# Patient Record
Sex: Female | Born: 1955
Health system: Southern US, Community
[De-identification: ages and names within clinical notes are randomized; demographics above are authoritative.]

## PROBLEM LIST (undated history)

## (undated) DIAGNOSIS — F5101 Primary insomnia: Secondary | ICD-10-CM

## (undated) DIAGNOSIS — M5430 Sciatica, unspecified side: Secondary | ICD-10-CM

## (undated) DIAGNOSIS — K219 Gastro-esophageal reflux disease without esophagitis: Secondary | ICD-10-CM

## (undated) DIAGNOSIS — R011 Cardiac murmur, unspecified: Secondary | ICD-10-CM

## (undated) DIAGNOSIS — I10 Essential (primary) hypertension: Secondary | ICD-10-CM

## (undated) DIAGNOSIS — I519 Heart disease, unspecified: Secondary | ICD-10-CM

## (undated) DIAGNOSIS — G4730 Sleep apnea, unspecified: Secondary | ICD-10-CM

## (undated) DIAGNOSIS — M722 Plantar fascial fibromatosis: Secondary | ICD-10-CM

## (undated) DIAGNOSIS — I219 Acute myocardial infarction, unspecified: Secondary | ICD-10-CM

## (undated) DIAGNOSIS — G4700 Insomnia, unspecified: Secondary | ICD-10-CM

## (undated) DIAGNOSIS — M1990 Unspecified osteoarthritis, unspecified site: Secondary | ICD-10-CM

## (undated) DIAGNOSIS — K5900 Constipation, unspecified: Secondary | ICD-10-CM

## (undated) DIAGNOSIS — I38 Endocarditis, valve unspecified: Secondary | ICD-10-CM

## (undated) DIAGNOSIS — F32A Depression, unspecified: Secondary | ICD-10-CM

## (undated) DIAGNOSIS — R51 Headache: Secondary | ICD-10-CM

## (undated) DIAGNOSIS — K432 Incisional hernia without obstruction or gangrene: Secondary | ICD-10-CM

## (undated) DIAGNOSIS — E7800 Pure hypercholesterolemia, unspecified: Secondary | ICD-10-CM

## (undated) DIAGNOSIS — F329 Major depressive disorder, single episode, unspecified: Secondary | ICD-10-CM

## (undated) DIAGNOSIS — F17200 Nicotine dependence, unspecified, uncomplicated: Secondary | ICD-10-CM

## (undated) DIAGNOSIS — I2510 Atherosclerotic heart disease of native coronary artery without angina pectoris: Secondary | ICD-10-CM

## (undated) DIAGNOSIS — I5189 Other ill-defined heart diseases: Secondary | ICD-10-CM

## (undated) DIAGNOSIS — G2581 Restless legs syndrome: Secondary | ICD-10-CM

## (undated) DIAGNOSIS — F419 Anxiety disorder, unspecified: Secondary | ICD-10-CM

## (undated) DIAGNOSIS — R519 Headache, unspecified: Secondary | ICD-10-CM

## (undated) DIAGNOSIS — T8859XA Other complications of anesthesia, initial encounter: Secondary | ICD-10-CM

## (undated) DIAGNOSIS — T4145XA Adverse effect of unspecified anesthetic, initial encounter: Secondary | ICD-10-CM

## (undated) DIAGNOSIS — M7120 Synovial cyst of popliteal space [Baker], unspecified knee: Secondary | ICD-10-CM

## (undated) DIAGNOSIS — J301 Allergic rhinitis due to pollen: Secondary | ICD-10-CM

## (undated) HISTORY — PX: CARPAL TUNNEL RELEASE: SHX101

## (undated) HISTORY — DX: Gastro-esophageal reflux disease without esophagitis: K21.9

## (undated) HISTORY — DX: Allergic rhinitis due to pollen: J30.1

## (undated) HISTORY — DX: Headache, unspecified: R51.9

## (undated) HISTORY — DX: Unspecified osteoarthritis, unspecified site: M19.90

## (undated) HISTORY — DX: Cardiac murmur, unspecified: R01.1

## (undated) HISTORY — DX: Depression, unspecified: F32.A

## (undated) HISTORY — DX: Anxiety disorder, unspecified: F41.9

## (undated) HISTORY — DX: Major depressive disorder, single episode, unspecified: F32.9

## (undated) HISTORY — DX: Other ill-defined heart diseases: I51.89

## (undated) HISTORY — DX: Nicotine dependence, unspecified, uncomplicated: F17.200

## (undated) HISTORY — DX: Heart disease, unspecified: I51.9

## (undated) HISTORY — DX: Restless legs syndrome: G25.81

## (undated) HISTORY — PX: VAGINAL HYSTERECTOMY: SUR661

## (undated) HISTORY — DX: Sciatica, unspecified side: M54.30

## (undated) HISTORY — DX: Pure hypercholesterolemia, unspecified: E78.00

## (undated) HISTORY — DX: Synovial cyst of popliteal space (Baker), unspecified knee: M71.20

## (undated) HISTORY — DX: Insomnia, unspecified: G47.00

## (undated) HISTORY — PX: CORONARY ANGIOPLASTY: SHX604

## (undated) HISTORY — DX: Headache: R51

---
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
## 1976-11-21 HISTORY — PX: APPENDECTOMY: SHX54

## 1995-11-22 HISTORY — PX: ANGIOPLASTY: SHX39

## 2011-03-21 ENCOUNTER — Ambulatory Visit: Payer: Self-pay | Admitting: Internal Medicine

## 2011-03-29 ENCOUNTER — Encounter: Payer: Self-pay | Admitting: Family Medicine

## 2011-03-29 ENCOUNTER — Ambulatory Visit: Payer: Self-pay | Admitting: Family Medicine

## 2011-03-29 ENCOUNTER — Ambulatory Visit (INDEPENDENT_AMBULATORY_CARE_PROVIDER_SITE_OTHER): Payer: Medicare Other | Admitting: Family Medicine

## 2011-03-29 DIAGNOSIS — I2510 Atherosclerotic heart disease of native coronary artery without angina pectoris: Secondary | ICD-10-CM

## 2011-03-29 DIAGNOSIS — F410 Panic disorder [episodic paroxysmal anxiety] without agoraphobia: Secondary | ICD-10-CM

## 2011-03-29 DIAGNOSIS — F419 Anxiety disorder, unspecified: Secondary | ICD-10-CM | POA: Insufficient documentation

## 2011-03-29 DIAGNOSIS — F17200 Nicotine dependence, unspecified, uncomplicated: Secondary | ICD-10-CM

## 2011-03-29 DIAGNOSIS — Z87891 Personal history of nicotine dependence: Secondary | ICD-10-CM | POA: Insufficient documentation

## 2011-03-29 DIAGNOSIS — M5430 Sciatica, unspecified side: Secondary | ICD-10-CM

## 2011-03-29 NOTE — Patient Instructions (Signed)
We'll request your records.   See Shirlee Limerick about your referral before your leave today. Call back as needed.  I'd like to see you later in the summer (August, OV).

## 2011-03-29 NOTE — Assessment & Plan Note (Addendum)
1 hour spent with patient in total.  No indication for imaging.  REfer to PT.

## 2011-03-30 ENCOUNTER — Encounter: Payer: Self-pay | Admitting: Family Medicine

## 2011-03-30 NOTE — Progress Notes (Signed)
New patient.   Hypertension:    Using medication without problems or lightheadedness: yes Chest pain with exertion:no Edema:no Short of breath:no  Elevated Cholesterol: Using medications without problems:yes Muscle aches: no  Smoker, trying to quit.  L sciatica.  L leg occ numb.  Pain radiates down L leg.  No weakness. Not a new sx.  Never in PT prev.  No bowel changes.   Anxiety.  Intolerant to mult meds.  No SI/HI.  Contracts for safety.    Insomnia, treated with meds above.  No illicit use.   Meds, vitals, and allergies reviewed.   PMH and SH reviewed  ROS: See HPI.  Otherwise negative.    GEN: nad, alert and oriented HEENT: mucous membranes moist NECK: supple w/o LA CV: rrr. PULM: ctab, no inc wob ABD: soft, +bs EXT: no edema SKIN: no acute rash Back w/o midline pain.   SLR + on L, distally nv intact  Requesting records.

## 2011-03-30 NOTE — Assessment & Plan Note (Signed)
She's working to quit.

## 2011-03-30 NOTE — Assessment & Plan Note (Signed)
No CP.  Requesting records.

## 2011-03-30 NOTE — Assessment & Plan Note (Signed)
Continue current meds.  No SI/HI.

## 2011-03-31 ENCOUNTER — Encounter: Payer: Self-pay | Admitting: Family Medicine

## 2011-04-03 ENCOUNTER — Telehealth: Payer: Self-pay | Admitting: Family Medicine

## 2011-04-03 DIAGNOSIS — I2510 Atherosclerotic heart disease of native coronary artery without angina pectoris: Secondary | ICD-10-CM

## 2011-04-03 NOTE — Telephone Encounter (Signed)
Please call pt.  I got the old records from cards in [place].  I'd like her to see cards locally, if only to est care.  This isn't an emergency, but would be useful.  I put in the referral.

## 2011-04-04 NOTE — Telephone Encounter (Signed)
Patient notified as instructed by telephone. Advised patient that Shirlee Limerick will be in touch with her about getting this referral scheduled for cardiology. Patient stated that she can go any time for an appointment.

## 2011-04-04 NOTE — Telephone Encounter (Signed)
Left message on voice mail  to call back

## 2011-04-05 NOTE — Telephone Encounter (Signed)
Appt made with Dr Tildon Husky [facility] on 04/20/2011. MK

## 2011-04-20 ENCOUNTER — Ambulatory Visit: Payer: Medicare Other | Admitting: Cardiovascular Disease

## 2011-04-22 ENCOUNTER — Encounter: Payer: Self-pay | Admitting: Family Medicine

## 2011-04-28 ENCOUNTER — Ambulatory Visit (INDEPENDENT_AMBULATORY_CARE_PROVIDER_SITE_OTHER): Payer: Medicare Other | Admitting: Cardiovascular Disease

## 2011-04-28 ENCOUNTER — Encounter: Payer: Self-pay | Admitting: Cardiovascular Disease

## 2011-04-28 DIAGNOSIS — F410 Panic disorder [episodic paroxysmal anxiety] without agoraphobia: Secondary | ICD-10-CM

## 2011-04-28 DIAGNOSIS — G4700 Insomnia, unspecified: Secondary | ICD-10-CM

## 2011-04-28 DIAGNOSIS — I2510 Atherosclerotic heart disease of native coronary artery without angina pectoris: Secondary | ICD-10-CM

## 2011-04-28 DIAGNOSIS — F17200 Nicotine dependence, unspecified, uncomplicated: Secondary | ICD-10-CM

## 2011-04-28 DIAGNOSIS — E785 Hyperlipidemia, unspecified: Secondary | ICD-10-CM

## 2011-04-28 DIAGNOSIS — I498 Other specified cardiac arrhythmias: Secondary | ICD-10-CM

## 2011-04-28 DIAGNOSIS — R001 Bradycardia, unspecified: Secondary | ICD-10-CM | POA: Insufficient documentation

## 2011-04-28 MED ORDER — ATENOLOL 25 MG PO TABS: 25.0000 mg | ORAL_TABLET | Freq: Every day | ORAL | Status: DC

## 2011-04-28 NOTE — Assessment & Plan Note (Signed)
Cause of her insomnia is uncertain. She reports doing better with Ambien. I suspect this is contributing to her fatigue. I suspect she is also deconditioned which would explain some of her fatigue with exertion. I suggested she start an exercise program and continue to work on her weight.

## 2011-04-28 NOTE — Assessment & Plan Note (Signed)
Her heart rate is low today and I suggested she cut her atenolol to 12.5 mg daily. His hernia possible that the bradycardia is contributing to her fatigue.

## 2011-04-28 NOTE — Patient Instructions (Addendum)
You are doing well. If you feel fatigued, please cut your atenolol in 1/2. Please call us if you have new issues that need to be addressed before your next appt.  We will call you for a follow up Appt. In 12 months

## 2011-04-28 NOTE — Assessment & Plan Note (Signed)
We have encouraged her to continue to work on weaning her cigarettes and smoking cessation. She will continue to work on this and does not want any assistance with chantix.  

## 2011-04-28 NOTE — Assessment & Plan Note (Signed)
We will try to obtain her most recent cholesterol panel. Goal LDL is less than 70. This is based on her report of previous coronary artery disease.

## 2011-04-28 NOTE — Progress Notes (Signed)
   Patient ID: Andrea Webster, female    DOB: 10/17/1956, 55 y.o.   MRN: [id number]  HPI Comments: 55 year old woman with mild obesity, hyperlipidemia, long smoking history who continues to smoke one half pack per day, history of panic disorder, reported history of coronary artery disease per the patient with "MI at age 55", presents for consultation for her underlying coronary artery disease.  She reports that she has a significant family history of coronary artery disease. Father died at age 43, mother passed away at age 59 after she had had a bypass surgery. Currently the patient does have any significant symptoms of shortness of breath or chest pain.  She does report that she has significant fatigue. She does not sleep very well. She takes Ambien which allows her to sleep 4-5 hours at a time. She is active though does upper dysphagia and a regular exercise program.  She reports that her last cholesterol panel was done by her previous physician Dr. Laural Benes in [place]. She was told that her numbers were reasonable.  EKG today shows sinus bradycardia with rate 53 beats per minute with nonspecific T-wave abnormality through the anterior precordial leads     Review of Systems  Constitutional: Positive for fatigue.  HENT: Negative.   Eyes: Negative.   Respiratory: Negative.   Cardiovascular: Negative.   Gastrointestinal: Negative.   Musculoskeletal: Negative.   Skin: Negative.   Neurological: Negative.   Hematological: Negative.   Psychiatric/Behavioral: Positive for sleep disturbance.  All other systems reviewed and are negative.    BP 128/68  Pulse 53  Ht 5' (1.524 m)  Wt 166 lb (75.297 kg)  BMI 32.42 kg/m2   Physical Exam  Nursing note and vitals reviewed. Constitutional: She is oriented to person, place, and time. She appears well-developed and well-nourished.       Mild to moderately obese  HENT:  Head: Normocephalic.  Nose: Nose normal.  Mouth/Throat: Oropharynx is  clear and moist.  Eyes: Conjunctivae are normal. Pupils are equal, round, and reactive to light.  Neck: Normal range of motion. Neck supple. No JVD present.  Cardiovascular: Normal rate, regular rhythm, S1 normal, S2 normal, normal heart sounds and intact distal pulses.  Exam reveals no gallop and no friction rub.   No murmur heard. Pulmonary/Chest: Effort normal and breath sounds normal. No respiratory distress. She has no wheezes. She has no rales. She exhibits no tenderness.  Abdominal: Soft. Bowel sounds are normal. She exhibits no distension. There is no tenderness.  Musculoskeletal: Normal range of motion. She exhibits no edema and no tenderness.  Lymphadenopathy:    She has no cervical adenopathy.  Neurological: She is alert and oriented to person, place, and time. Coordination normal.  Skin: Skin is warm and dry. No rash noted. No erythema.  Psychiatric: She has a normal mood and affect. Her behavior is normal. Judgment and thought content normal.         Assessment and Plan

## 2011-04-28 NOTE — Assessment & Plan Note (Signed)
Details of her previous cardiac events are unavailable. We have suggested that we continue to treat her aggressively. EKG does not suggest significant old infarct. She is also relatively asymptomatic at this time. She certainly does have a significant family history. Her most important goal for the remainder of the year is smoking cessation. We have expressed this to her.

## 2011-06-29 ENCOUNTER — Ambulatory Visit (INDEPENDENT_AMBULATORY_CARE_PROVIDER_SITE_OTHER): Payer: Medicare Other | Admitting: Family Medicine

## 2011-06-29 ENCOUNTER — Encounter: Payer: Self-pay | Admitting: Family Medicine

## 2011-06-29 VITALS — BP 110/62 | HR 80 | Temp 97.8°F | Wt 170.2 lb

## 2011-06-29 DIAGNOSIS — E7800 Pure hypercholesterolemia, unspecified: Secondary | ICD-10-CM

## 2011-06-29 DIAGNOSIS — F17200 Nicotine dependence, unspecified, uncomplicated: Secondary | ICD-10-CM

## 2011-06-29 DIAGNOSIS — F410 Panic disorder [episodic paroxysmal anxiety] without agoraphobia: Secondary | ICD-10-CM

## 2011-06-29 DIAGNOSIS — E785 Hyperlipidemia, unspecified: Secondary | ICD-10-CM

## 2011-06-29 DIAGNOSIS — B079 Viral wart, unspecified: Secondary | ICD-10-CM | POA: Insufficient documentation

## 2011-06-29 MED ORDER — OMEPRAZOLE 20 MG PO CPDR: 20.0000 mg | DELAYED_RELEASE_CAPSULE | Freq: Every day | ORAL | Status: DC

## 2011-06-29 MED ORDER — ALPRAZOLAM 1 MG PO TABS: 1.0000 mg | ORAL_TABLET | Freq: Three times a day (TID) | ORAL | Status: DC | PRN
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
## 2011-06-29 NOTE — Assessment & Plan Note (Signed)
Continue current meds and return for labs as scheduled.
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
## 2011-06-29 NOTE — Patient Instructions (Signed)
Try taking ranitine 150mg  twice a day.  If that doesn't help, take omeprazole 20mg  a day.   Come back for labs in 2/13.  OV a few days after that.  Try to gradually lose weight.  Take care.

## 2011-06-29 NOTE — Assessment & Plan Note (Signed)
Small, I would observe for now and treat if enlarging.  She understood.

## 2011-06-29 NOTE — Assessment & Plan Note (Signed)
D/w pt about cutting down and then quitting.

## 2011-07-19 ENCOUNTER — Telehealth: Payer: Self-pay | Admitting: *Deleted

## 2011-07-19 MED ORDER — CYCLOBENZAPRINE HCL 10 MG PO TABS: 5.0000 mg | ORAL_TABLET | Freq: Three times a day (TID) | ORAL | Status: DC | PRN

## 2011-07-19 NOTE — Telephone Encounter (Signed)
Patient advised.

## 2011-07-19 NOTE — Telephone Encounter (Signed)
Patient stated that she hurt her back yesterday.Patient stated that you are aware that she has had problems with her back in the past. Offered patient an appointment today which she declined because she does not have anyone that can bring her in. Patient requested that you call her in some Flexeril because she has taken this in the past for her back and it has helped. Pharmacy/[facility], [address]

## 2011-07-19 NOTE — Telephone Encounter (Signed)
Rx sent, have pt f/u if not improved.  Sedation caution on meds. Thanks.

## 2011-09-14 ENCOUNTER — Ambulatory Visit (INDEPENDENT_AMBULATORY_CARE_PROVIDER_SITE_OTHER): Payer: Medicare Other | Admitting: Family Medicine

## 2011-09-14 ENCOUNTER — Encounter: Payer: Self-pay | Admitting: Family Medicine

## 2011-09-14 DIAGNOSIS — J069 Acute upper respiratory infection, unspecified: Secondary | ICD-10-CM

## 2011-09-14 NOTE — Progress Notes (Signed)
Achy, tired over last few days.  Sleeping more since Saturday.  "I just am no good."  Alternating hot and cold.  Yesterday drank a lot of gatorade, feels some better.  Hadn't needed to nap today, but had been doing it a lot over last few days.  Some sinus pressure, some better today.  Minimal cough, a little more than normal.  Smoking ~1/2 ppd.  Brother in law was recently sick.  Aches and temp changes are worse in the afternoon.    Social situation at home is some better.    ROS: See HPI.  Otherwise negative.    Meds, vitals, and allergies reviewed.   GEN: nad, alert and oriented HEENT: mucous membranes moist, TM w/o erythema but SOM noted, nasal epithelium injected, OP with cobblestoning, poor TM movement on valsalva NECK: supple w/o LA CV: rrr. PULM: ctab, no inc wob ABD: soft, +bs EXT: no edema

## 2011-09-14 NOTE — Assessment & Plan Note (Signed)
Nontoxic, lungs clear, likely viral and okay for outpatient f/u.  Some improvement already today.  D/w pt.  Rest and supportive tx.

## 2011-09-14 NOTE — Patient Instructions (Addendum)
I would get a flu shot each fall (when you get better). Drink plenty of fluids, take tylenol as needed, and this should gradually improve.  Take care.  Let us know if you have other concerns.

## 2011-09-23 ENCOUNTER — Other Ambulatory Visit: Payer: Self-pay | Admitting: *Deleted

## 2011-09-23 MED ORDER — ZOLPIDEM TARTRATE 10 MG PO TABS: 10.0000 mg | ORAL_TABLET | Freq: Every day | ORAL | Status: DC

## 2011-09-23 NOTE — Telephone Encounter (Signed)
Patient tells pharmacist that she takes 2 tablets by mouth at bedtime.

## 2011-09-23 NOTE — Telephone Encounter (Signed)
Okay to call in, but only for 1 tab po qhs.

## 2011-09-23 NOTE — Telephone Encounter (Signed)
Medication phoned to pharmacy.  

## 2011-10-05 ENCOUNTER — Ambulatory Visit: Payer: Medicare Other | Admitting: Family Medicine

## 2011-10-24 ENCOUNTER — Other Ambulatory Visit: Payer: Self-pay | Admitting: *Deleted

## 2011-10-24 NOTE — Telephone Encounter (Signed)
Received faxed refill request from pharmacy. Is it okay to refill medication? 

## 2011-10-25 MED ORDER — ALPRAZOLAM 1 MG PO TABS: 1.0000 mg | ORAL_TABLET | Freq: Three times a day (TID) | ORAL | Status: DC | PRN

## 2011-10-25 NOTE — Telephone Encounter (Signed)
Please call in

## 2011-10-26 NOTE — Telephone Encounter (Signed)
Medication phoned to pharmacy.  

## 2011-11-01 ENCOUNTER — Encounter: Payer: Self-pay | Admitting: Family Medicine

## 2011-11-01 ENCOUNTER — Ambulatory Visit (INDEPENDENT_AMBULATORY_CARE_PROVIDER_SITE_OTHER): Payer: Medicare Other | Admitting: Family Medicine

## 2011-11-01 VITALS — BP 118/78 | HR 57 | Temp 97.8°F | Wt 171.0 lb

## 2011-11-01 DIAGNOSIS — E7800 Pure hypercholesterolemia, unspecified: Secondary | ICD-10-CM

## 2011-11-01 DIAGNOSIS — Z23 Encounter for immunization: Secondary | ICD-10-CM

## 2011-11-01 DIAGNOSIS — E785 Hyperlipidemia, unspecified: Secondary | ICD-10-CM

## 2011-11-01 DIAGNOSIS — M25569 Pain in unspecified knee: Secondary | ICD-10-CM

## 2011-11-01 LAB — COMPREHENSIVE METABOLIC PANEL
Albumin: 4.5 g/dL (ref 3.5–5.2)
BUN: 6 mg/dL (ref 6–23)
CO2: 28 mEq/L (ref 19–32)
GFR: 72.09 mL/min (ref >60.00)
Glucose, Bld: 93 mg/dL (ref 70–99)
Sodium: 137 mEq/L (ref 135–145)
Total Bilirubin: 0.9 mg/dL (ref 0.3–1.2)
Total Protein: 7.2 g/dL (ref 6.0–8.3)

## 2011-11-01 LAB — LIPID PANEL
Cholesterol: 178 mg/dL (ref 0–200)
Triglycerides: 112 mg/dL (ref 0.0–149.0)

## 2011-11-01 MED ORDER — TRAMADOL HCL 50 MG PO TABS: 50.0000 mg | ORAL_TABLET | Freq: Three times a day (TID) | ORAL | Status: DC | PRN

## 2011-11-01 NOTE — Progress Notes (Signed)
Knee pain.  Bilateral pain.  Some days are worse than others.  Pain with walking.  Pain with stairs.  AM stiffness in knees.  Audible crepitus in knees.  Tylenol and goodies for pain, minimal help.  Longstanding pain.    She's doing her exercises from PT and the sciatica is better.    HLD: Due for labs.  Will be drawn on the way out.  Fasting today.    Due for flu shot.  Done today.   Meds, vitals, and allergies reviewed.   ROS: See HPI.  Otherwise, noncontributory.  nad ncat B knee exam with normal ROM, audible crepitus, medial > lateral joint line pain.  No meniscal click, ACL/MCL/LCL intact on testing.

## 2011-11-01 NOTE — Patient Instructions (Addendum)
You can get your results through our phone system.  Follow the instructions on the blue card. I would take the tramadol for pain.  It can make you drowsy.  Let me know if that isn't helping.  I would continue to walk to exercise your legs.   Take care.

## 2011-11-02 ENCOUNTER — Encounter: Payer: Self-pay | Admitting: Family Medicine

## 2011-11-02 DIAGNOSIS — M25569 Pain in unspecified knee: Secondary | ICD-10-CM | POA: Insufficient documentation

## 2011-11-02 NOTE — Assessment & Plan Note (Signed)
See notes on labs. 

## 2011-11-02 NOTE — Assessment & Plan Note (Signed)
Likely OA, start tramadol, work on weight loss and f/u prn.  She agrees.

## 2011-12-23 ENCOUNTER — Other Ambulatory Visit: Payer: Self-pay | Admitting: *Deleted

## 2011-12-23 MED ORDER — OMEPRAZOLE 20 MG PO CPDR: 20.0000 mg | DELAYED_RELEASE_CAPSULE | Freq: Every day | ORAL | Status: DC

## 2011-12-23 NOTE — Telephone Encounter (Signed)
Faxed refill request   

## 2011-12-25 MED ORDER — CYCLOBENZAPRINE HCL 10 MG PO TABS: 5.0000 mg | ORAL_TABLET | Freq: Three times a day (TID) | ORAL | Status: AC | PRN

## 2011-12-25 MED ORDER — ZOLPIDEM TARTRATE 10 MG PO TABS: 10.0000 mg | ORAL_TABLET | Freq: Every day | ORAL | Status: DC

## 2011-12-25 NOTE — Telephone Encounter (Signed)
Please call in ambien

## 2011-12-26 ENCOUNTER — Other Ambulatory Visit: Payer: Self-pay | Admitting: *Deleted

## 2011-12-26 ENCOUNTER — Telehealth: Payer: Self-pay | Admitting: Family Medicine

## 2011-12-26 NOTE — Telephone Encounter (Signed)
Patient called stating that [facility] is waiting on a refill okay for her Ambien. Please send this in.

## 2011-12-26 NOTE — Telephone Encounter (Signed)
Medication phoned to pharmacy.  

## 2011-12-26 NOTE — Telephone Encounter (Signed)
Please call this in  Thanks

## 2011-12-28 ENCOUNTER — Telehealth: Payer: Self-pay | Admitting: Family Medicine

## 2011-12-28 NOTE — Telephone Encounter (Signed)
Spoke to patient and was advised that the refill has already been taken care of and to disregard the phone call.

## 2012-01-20 ENCOUNTER — Other Ambulatory Visit: Payer: Self-pay | Admitting: *Deleted

## 2012-01-20 MED ORDER — ATENOLOL 25 MG PO TABS: 25.0000 mg | ORAL_TABLET | Freq: Every day | ORAL | Status: DC

## 2012-01-20 MED ORDER — SIMVASTATIN 20 MG PO TABS: 20.0000 mg | ORAL_TABLET | Freq: Every day | ORAL | Status: DC

## 2012-01-20 NOTE — Telephone Encounter (Signed)
Patient verified that she takes Atenolol 25mg  daily not 12.5mg  daily.  Rx's sent to pharmacy electronically.

## 2012-02-11 ENCOUNTER — Encounter: Payer: Self-pay | Admitting: Family Medicine

## 2012-02-11 ENCOUNTER — Ambulatory Visit (INDEPENDENT_AMBULATORY_CARE_PROVIDER_SITE_OTHER): Payer: Medicare Other | Admitting: Family Medicine

## 2012-02-11 VITALS — BP 110/68 | Temp 98.7°F | Wt 180.0 lb

## 2012-02-11 DIAGNOSIS — J029 Acute pharyngitis, unspecified: Secondary | ICD-10-CM

## 2012-02-11 LAB — POCT RAPID STREP A (OFFICE): Rapid Strep A Screen: NEGATIVE

## 2012-02-11 NOTE — Progress Notes (Signed)
OFFICE NOTE  02/11/2012  CC:  Chief Complaint  Patient presents with  . Sore Throat    X 2 days     HPI: Patient is a 56 y.o. Caucasian female who is here for sore throat. Pt presents complaining of respiratory symptoms for 2 days.  Mostly nasal congestion/runny nose, severe ST on right.   Worst symptoms seems to be the ST.  Minimal cough.  Lately the symptoms seem to be worsening. No fevers, no wheezing, and no SOB.  No pain in face or teeth.  Some frontal HA.   Symptoms made worse by swallowing.  Symptoms improved by hot coffee and tylenol. Smoker? Yes, until 60mo ago she started e cigs. Recent sick contact? Fiance w/ pneumonia recently Muscle or joint aches? yes Flu shot this season at least 2 wks ago? Yes.  +pneumovax last year.  ROS: no n/v/d or abdominal pain.  No rash.  No neck stiffness.   +Mild fatigue.  +Mild appetite loss.   Pertinent PMH:  CAD Tobacco abuse Insomnia GERD Anxiety No hx of COPD or asthma per pt  MEDS:  Outpatient Prescriptions Prior to Visit  Medication Sig Dispense Refill  . ALPRAZolam (XANAX) 1 MG tablet Take 1 tablet (1 mg total) by mouth 3 (three) times daily as needed for anxiety.  90 tablet  3  . aspirin 81 MG tablet Take 81 mg by mouth daily.        Marland Kitchen atenolol (TENORMIN) 25 MG tablet Take 1 tablet (25 mg total) by mouth daily.  30 tablet  6  . cyclobenzaprine (FLEXERIL) 10 MG tablet Take 0.5-1 tablets (5-10 mg total) by mouth every 8 (eight) hours as needed for muscle spasms (sedation caution).  30 tablet  2  . isosorbide dinitrate (ISORDIL) 20 MG tablet Take 1/2 by mouth daily       . Multiple Vitamin (MULTIVITAMIN) tablet Take 1 tablet by mouth daily.        Marland Kitchen omeprazole (PRILOSEC) 20 MG capsule Take 1 capsule (20 mg total) by mouth daily.  30 capsule  5  . senna (SENOKOT) 8.6 MG tablet Take 1 tablet by mouth as needed.        . simvastatin (ZOCOR) 20 MG tablet Take 1 tablet (20 mg total) by mouth at bedtime.  30 tablet  6  . traMADol  (ULTRAM) 50 MG tablet Take 1 tablet (50 mg total) by mouth every 8 (eight) hours as needed for pain. Maximum dose= 8 tablets per day  60 tablet  1  . zolpidem (AMBIEN) 10 MG tablet Take 1 tablet (10 mg total) by mouth at bedtime.  30 tablet  2    PE: Blood pressure 110/68, temperature 98.7 F (37.1 C), temperature source Oral, weight 180 lb (81.647 kg). VS: noted--normal. Gen: alert, NAD, NONTOXIC APPEARING. HEENT: eyes without injection, drainage, or swelling.  Ears: EACs clear, TMs with normal light reflex and landmarks.  Nose: Clear rhinorrhea, with some dried, crusty exudate adherent to mildly injected mucosa.  No purulent d/c.  No paranasal sinus TTP.  No facial swelling.  Throat and mouth without focal lesion.  Mild R>L exudative tonsillitis, swelling on right a bit more than left.  Neck: supple, no LAD.   LUNGS: CTA bilat, nonlabored resps.   Neck: no significant adenopathy, no asymmetry CV: RRR, no m/r/g. EXT: no c/c/e SKIN: no rash  LAB: rapid strep NEGATIVE  IMPRESSION AND PLAN:  Pharyngitis, acute Throat culture sent to lab. Self-limited nature of this illness was  discussed, questions answered.  Discussed symptomatic care, rest, fluids.   Warning signs/symptoms of worsening illness were discussed.  Patient instructed to call or return if any of these occur. Offered rx pain medication for comfort but pt declined. We reviewed max dose of tylenol and/or ibuprofen, plus local comfort measures of gargling salt water and chloraseptic spray prn.      FOLLOW UP: prn

## 2012-02-11 NOTE — Patient Instructions (Signed)
Take 1000mg  tylenol every 6 hours, gargle with salt water, use OTC spray called chloraseptic. We'll call you if throat culture is positive for bacteria/strep.

## 2012-02-13 ENCOUNTER — Telehealth: Payer: Self-pay | Admitting: Family Medicine

## 2012-02-13 NOTE — Telephone Encounter (Signed)
Throat culture appears to still be pending.  I would continue as is for now and I'll await the cx results.
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
## 2012-02-13 NOTE — Telephone Encounter (Signed)
Sent Andrea Webster a note asking if she knew how long it would take to get these results.

## 2012-02-13 NOTE — Telephone Encounter (Signed)
Pt was seen at [facility] and would like throat culture report (pt said was told would have in 24 hrs; I told pt I did not see where throat culture report had come in yet). Pt said it feels like she is swallowing glass each time she swallows,still fever and chills but pt has not taken temp today. Pt is using Eucalyptus spray for outside of throat, cepacol and goody powder for relief of throat pain. Pt uses [facility] [address]. Pt can be reached at [phone].

## 2012-02-13 NOTE — Telephone Encounter (Signed)
[facility] Triage Call Report Triage Record Num: [id number] Operator: Patriciaann Clan Patient Name: Andrea Webster Call Date & Time: 02/10/2012 5:08:26PM Patient Phone: [phone] PCP: Andrea Webster Patient Gender: Female PCP Fax : Patient DOB: 1956-02-15 Practice Name: [facility] Reason for Call: Caller: Andrea Webster/Patient; PCP: Andrea Webster Andrea Webster); CB#: [phone]; ; ; Call regarding Sore Throat; Patient states she developed sore throat, onset 02/10/12. States has white patches on right side of throat. Patient states seh developed chills. Patient states she took Ibuprofen 400mg . 1645 02/10/12. Temp. 99.3 orally @ 1715 03/22/3. Patient did not check temperature prior to taking Ibprofen. Patient taking fluids well. Triage per Sore Throat Protocol. No emergent sx identified. Care advice given per guidelines. Patient advised increased fluids, saline nasal washes/netty pot, inhaled steam, humidifier. Patient advised may gargle with liquid Benadryl and liquid antacid. Patietn advised may take Ibuprofen 600mg . q 6 hours prn for pain-take with food. Call back parameters reviewed. Patient verbalizes understanding. Appt. scheduled for 02/11/12 1015 with Dr. Nicoletta Webster at [facility] office. Patient advised, verbalizes understanding. Protocol(s) Used: Sore Throat or Hoarseness Recommended Outcome per Protocol: See Provider within 24 hours Reason for Outcome: No tonsils and white patches on back of throat Care Advice: ~ Use a cool mist humidifier to moisten air. Be sure to clean according to manufacturer's instructions. Avoid exposure to environmental irritants. Do not smoke and avoid second-hand smoke. Avoid outside activities on high pollution days. Instead of strong smelling commercial cleaning products, substitute vinegar and lemon juice. ~ ~ SYMPTOM / CONDITION MANAGEMENT Most adults need to drink 6-10 eight-ounce glasses (1.2-2.0 liters) of fluids per day unless previously told to  limit fluid intake for other medical reasons. Limit fluids that contain caffeine, sugar or alcohol. Urine will be a very light yellow color when you drink enough fluids. ~ Analgesic/Antipyretic Advice - Acetaminophen: Consider acetaminophen as directed on label or by pharmacist/provider for pain or fever PRECAUTIONS: - Use if there is no history of liver disease, alcoholism, or intake of three or more alcohol drinks per day - Only if approved by provider during pregnancy or when breastfeeding - During pregnancy, acetaminophen should not be taken more than 3 consecutive days without telling provider - Do not exceed recommended dose or frequency ~ Sore Throat Relief: - Use warm salt water gargles 3 to 4 times/day, as needed (1/2 tsp. salt in 8 oz. [.2 liters] water). - Suck on hard candy, nonprescription or herbal throat lozenges (sugar-free if diabetic) - Eat soothing, soft food/fluids (broths, soups, or honey and lemon juice in hot tea, Popsicles, frozen yogurt or sherbet, scrambled eggs, cooked cereals, Jell-O or puddings) whichever is most comforting. - Avoid eating salty, spicy or acidic foods. ~ Analgesic/Antipyretic Advice - NSAIDs: Consider aspirin, ibuprofen, naproxen or ketoprofen for pain or fever as directed on label or by pharmacist/provider. PRECAUTIONS: - If over 18 years of age, should not take longer than 1 week without consulting provider. ~ 02/10/2012 5:36:09PM Page 1 of 2 [id number] [facility] Triage Call Report Patient Name: Andrea Webster continuation page/s EXCEPTIONS: - Should not be used if taking blood thinners or have bleeding problems. - Do not use if have history of sensitivity/allergy to any of these medications; or history of cardiovascular, ulcer, kidney, liver disease or diabetes unless approved by provider. - Do not exceed recommended dose or frequency. 03/

## 2012-02-14 DIAGNOSIS — J029 Acute pharyngitis, unspecified: Secondary | ICD-10-CM | POA: Insufficient documentation

## 2012-02-14 NOTE — Assessment & Plan Note (Addendum)
Throat culture sent to lab. Self-limited nature of this illness was discussed, questions answered.  Discussed symptomatic care, rest, fluids.   Warning signs/symptoms of worsening illness were discussed.  Patient instructed to call or return if any of these occur. Offered rx pain medication for comfort but pt declined. We reviewed max dose of tylenol and/or ibuprofen, plus local comfort measures of gargling salt water and chloraseptic spray prn.

## 2012-02-22 ENCOUNTER — Other Ambulatory Visit: Payer: Self-pay | Admitting: *Deleted

## 2012-02-22 NOTE — Telephone Encounter (Signed)
Received faxed refill request from pharmacy. Is it okay to refill medication? 

## 2012-02-23 MED ORDER — ALPRAZOLAM 1 MG PO TABS: 1.0000 mg | ORAL_TABLET | Freq: Three times a day (TID) | ORAL | Status: DC | PRN

## 2012-02-23 NOTE — Telephone Encounter (Signed)
Please call in

## 2012-02-23 NOTE — Telephone Encounter (Signed)
Medication phoned to pharmacy.  

## 2012-03-22 ENCOUNTER — Other Ambulatory Visit: Payer: Self-pay | Admitting: *Deleted

## 2012-03-22 NOTE — Telephone Encounter (Signed)
Last refill 02/21/2012.

## 2012-03-23 MED ORDER — ZOLPIDEM TARTRATE 10 MG PO TABS: 10.0000 mg | ORAL_TABLET | Freq: Every day | ORAL | Status: DC

## 2012-03-23 NOTE — Telephone Encounter (Signed)
Medication phoned to pharmacy.  

## 2012-03-23 NOTE — Telephone Encounter (Signed)
Please call in

## 2012-04-11 ENCOUNTER — Telehealth: Payer: Self-pay | Admitting: Family Medicine

## 2012-04-11 DIAGNOSIS — I2510 Atherosclerotic heart disease of native coronary artery without angina pectoris: Secondary | ICD-10-CM

## 2012-04-11 NOTE — Telephone Encounter (Signed)
Done

## 2012-04-11 NOTE — Telephone Encounter (Signed)
Pt is wanting Cardiac Rehab referral for [facility] at [facility]. She is currently using [facility] because that is what her insurance would cover and she is saying it is extremely unsanitary and has "dirty" equipment. She heard really wonderful things about the program offered at [facility] and they tell her she needs a Cardiac referral.

## 2012-04-13 ENCOUNTER — Telehealth: Payer: Self-pay | Admitting: Family Medicine

## 2012-04-13 NOTE — Telephone Encounter (Signed)
I looked further into the Cardiac Rehab referral on Andrea Webster. They are saying that because she had an Angioplasty 14 years ago that she wouldn't qualify for cardiac rehab and her insurance wouldn't pay. I left message for patient to call back so I could explain the situation. I did suggest that the [facility] may be better than the [facility] facilities and her insurance may cover that within the silver sneakers program. She is supposed to call me back, I just wanted to let you know the situation.

## 2012-04-13 NOTE — Telephone Encounter (Signed)
Noted  

## 2012-04-13 NOTE — Telephone Encounter (Signed)
Just spoke with Patient. She is ok with not being able to use the Cardiac Rehab. She will look into the [facility] or another program offered at [facility]. I will let Shirlee Limerick know so we can take the referral out.

## 2012-05-08 ENCOUNTER — Ambulatory Visit (INDEPENDENT_AMBULATORY_CARE_PROVIDER_SITE_OTHER): Payer: Medicare Other | Admitting: Family Medicine

## 2012-05-08 ENCOUNTER — Encounter: Payer: Self-pay | Admitting: Family Medicine

## 2012-05-08 VITALS — BP 102/70 | HR 66 | Temp 98.5°F | Wt 172.8 lb

## 2012-05-08 DIAGNOSIS — B081 Molluscum contagiosum: Secondary | ICD-10-CM | POA: Insufficient documentation

## 2012-05-08 NOTE — Progress Notes (Signed)
Had been exercising at [facility].  Since Friday, had nonitchy rash on forearms.  No other rash.  She had been wiping down machines before use at [facility].  Feeling well o/w.    Meds, vitals, and allergies reviewed.   ROS: See HPI.  Otherwise, noncontributory.  nad Blanching, flesh colored to red, round, small lesions (<<1cm) in nondermatomal distribution on ulnar>radial side of forearms.

## 2012-05-08 NOTE — Patient Instructions (Addendum)
The rash looks like molluscum.  It will eventually get better.  I would wait to see if you had more spots.  If needed, we can treat them with nitrogen here.  Take care.

## 2012-05-08 NOTE — Assessment & Plan Note (Signed)
Likely molluscum.  Would not treat at this point.  If significantly more lesions, then would consider liq N2 use.  Dx d/w pt.  F/u prn.

## 2012-05-23 ENCOUNTER — Other Ambulatory Visit: Payer: Self-pay | Admitting: Family Medicine

## 2012-05-23 NOTE — Telephone Encounter (Signed)
Pt request refill isosorbide to [facility]. Is on med list but do not see where Dr Para March has filled.Please advise.

## 2012-05-24 MED ORDER — ISOSORBIDE DINITRATE 20 MG PO TABS: ORAL_TABLET | ORAL | Status: DC

## 2012-05-24 NOTE — Addendum Note (Signed)
Addended by: Joaquim Nam on: 05/24/2012 09:17 PM   Modules accepted: Orders

## 2012-05-24 NOTE — Telephone Encounter (Signed)
Sent!

## 2012-06-21 ENCOUNTER — Other Ambulatory Visit: Payer: Self-pay | Admitting: *Deleted

## 2012-06-21 MED ORDER — OMEPRAZOLE 20 MG PO CPDR: 20.0000 mg | DELAYED_RELEASE_CAPSULE | Freq: Every day | ORAL | Status: DC

## 2012-06-21 MED ORDER — ALPRAZOLAM 1 MG PO TABS: 1.0000 mg | ORAL_TABLET | Freq: Three times a day (TID) | ORAL | Status: DC | PRN

## 2012-06-21 NOTE — Telephone Encounter (Signed)
Faxed refill request from [facility] [address], last filled 05/22/12.

## 2012-06-21 NOTE — Telephone Encounter (Signed)
Medication phoned to pharmacy and also resent Rx for Omeprazole.  Incorrect pharmacy was originally entered.

## 2012-06-21 NOTE — Telephone Encounter (Signed)
Please call in

## 2012-08-23 ENCOUNTER — Telehealth: Payer: Self-pay | Admitting: Family Medicine

## 2012-08-23 MED ORDER — SIMVASTATIN 20 MG PO TABS: 20.0000 mg | ORAL_TABLET | Freq: Every day | ORAL | Status: DC

## 2012-08-23 NOTE — Telephone Encounter (Signed)
Refill- simvastatin 20mg  tab. Take one tablet by mouth at bedtime. Qty 30 last fill 8.31.13

## 2012-08-31 ENCOUNTER — Ambulatory Visit (INDEPENDENT_AMBULATORY_CARE_PROVIDER_SITE_OTHER): Payer: Medicare Other | Admitting: Family Medicine

## 2012-08-31 ENCOUNTER — Encounter: Payer: Self-pay | Admitting: Family Medicine

## 2012-08-31 VITALS — BP 120/68 | HR 71 | Temp 98.9°F | Wt 180.0 lb

## 2012-08-31 DIAGNOSIS — R05 Cough: Secondary | ICD-10-CM

## 2012-08-31 MED ORDER — ALBUTEROL SULFATE HFA 108 (90 BASE) MCG/ACT IN AERS: 2.0000 | INHALATION_SPRAY | Freq: Four times a day (QID) | RESPIRATORY_TRACT | Status: DC | PRN

## 2012-08-31 MED ORDER — AZITHROMYCIN 250 MG PO TABS: ORAL_TABLET | ORAL | Status: DC

## 2012-08-31 NOTE — Patient Instructions (Addendum)
Use the inhaler every 6 hours as needed.  Start the antibiotics today and this should improve.  Take care.

## 2012-08-31 NOTE — Progress Notes (Signed)
Quit smoking 3 days ago.  "I have to quit."    Started with symptoms about 1 week ago.  Weak, HA, head congestion.  Cough started Wednesday, worse in meantime.  Waking up coughing.  Vomited from coughing.  No fevers.  No help with OTC meds. No ear pain.  ST from cough, scratchy.  Rhinorrhea.  Wheezing.  SOB during a coughing episode but not o/w. No clear sick contacts.    Meds, vitals, and allergies reviewed.   ROS: See HPI.  Otherwise, noncontributory.  nad ncat Tm wnl  Nasal exam congested OP with cobblestoning Neck supple, no LA rrr Diffuse rhonchi, no inc in wob

## 2012-09-02 DIAGNOSIS — R05 Cough: Secondary | ICD-10-CM | POA: Insufficient documentation

## 2012-09-02 NOTE — Assessment & Plan Note (Signed)
Start SABA for cough, begin abx given the exam and she needs to stop smoking. Nontoxic.  F/u prn.  She agrees.

## 2012-09-03 ENCOUNTER — Telehealth: Payer: Self-pay

## 2012-09-03 MED ORDER — HYDROCODONE-HOMATROPINE 5-1.5 MG/5ML PO SYRP: 5.0000 mL | ORAL_SOLUTION | Freq: Three times a day (TID) | ORAL | Status: DC | PRN

## 2012-09-03 NOTE — Telephone Encounter (Signed)
Please call in hycodan 

## 2012-09-03 NOTE — Telephone Encounter (Signed)
Patient advised. Medication phoned to pharmacy.  

## 2012-09-03 NOTE — Telephone Encounter (Signed)
Pt seen 08/31/12;wheezing and SOB is better but non productive cough is almost continuous, sitting up to sleep, request cough med to [facility] [address].Please advise.

## 2012-09-20 ENCOUNTER — Other Ambulatory Visit: Payer: Self-pay | Admitting: *Deleted

## 2012-09-20 MED ORDER — ZOLPIDEM TARTRATE 10 MG PO TABS: 10.0000 mg | ORAL_TABLET | Freq: Every day | ORAL | Status: DC

## 2012-09-20 NOTE — Telephone Encounter (Signed)
Please call in

## 2012-09-20 NOTE — Telephone Encounter (Signed)
Medication phoned to pharmacy.  

## 2012-10-22 ENCOUNTER — Other Ambulatory Visit: Payer: Self-pay

## 2012-10-22 NOTE — Telephone Encounter (Signed)
[facility] fax[facility] refill alprazolam. Last filled 09/20/12.Please advise.

## 2012-10-23 MED ORDER — ALPRAZOLAM 1 MG PO TABS: 1.0000 mg | ORAL_TABLET | Freq: Three times a day (TID) | ORAL | Status: DC | PRN

## 2012-10-23 NOTE — Telephone Encounter (Signed)
Rx phoned to pharmacy.  

## 2012-10-23 NOTE — Telephone Encounter (Signed)
Please call in

## 2012-11-23 ENCOUNTER — Ambulatory Visit (INDEPENDENT_AMBULATORY_CARE_PROVIDER_SITE_OTHER): Payer: Medicare Other | Admitting: *Deleted

## 2012-11-23 DIAGNOSIS — Z23 Encounter for immunization: Secondary | ICD-10-CM

## 2013-01-18 ENCOUNTER — Other Ambulatory Visit: Payer: Self-pay | Admitting: *Deleted

## 2013-01-18 MED ORDER — OMEPRAZOLE 20 MG PO CPDR: 20.0000 mg | DELAYED_RELEASE_CAPSULE | Freq: Every day | ORAL | Status: DC

## 2013-02-20 ENCOUNTER — Other Ambulatory Visit: Payer: Self-pay

## 2013-02-20 MED ORDER — ALPRAZOLAM 1 MG PO TABS: 1.0000 mg | ORAL_TABLET | Freq: Three times a day (TID) | ORAL | Status: DC | PRN

## 2013-02-20 NOTE — Telephone Encounter (Signed)
[facility] [address] faxed refill alprazolam. Please advise. Last filled 01/18/13.

## 2013-02-20 NOTE — Telephone Encounter (Signed)
Pt left v/m returning call; pt request call back [phone].

## 2013-02-20 NOTE — Telephone Encounter (Signed)
Patient notified as instructed by telephone. Pt scheduled CPX 04/11/13 at 9:45 am. Medication phoned to [facility] [address] pharmacy as instructed.

## 2013-02-20 NOTE — Telephone Encounter (Signed)
Please call in, due for CPE.

## 2013-02-20 NOTE — Telephone Encounter (Signed)
Left message on voice mail  to call back

## 2013-02-27 ENCOUNTER — Other Ambulatory Visit: Payer: Self-pay

## 2013-02-27 MED ORDER — ATENOLOL 25 MG PO TABS: 25.0000 mg | ORAL_TABLET | Freq: Every day | ORAL | Status: DC

## 2013-02-27 NOTE — Telephone Encounter (Signed)
Pt request refill atenolol 25 mg # 30 x 1; pt scheduled CPX 04/11/13. Pt advised done.

## 2013-03-21 ENCOUNTER — Other Ambulatory Visit: Payer: Self-pay | Admitting: *Deleted

## 2013-03-21 MED ORDER — SIMVASTATIN 20 MG PO TABS: 20.0000 mg | ORAL_TABLET | Freq: Every day | ORAL | Status: DC

## 2013-03-21 NOTE — Telephone Encounter (Signed)
Last filled 02/20/13

## 2013-03-22 MED ORDER — ZOLPIDEM TARTRATE 10 MG PO TABS: 10.0000 mg | ORAL_TABLET | Freq: Every day | ORAL | Status: DC

## 2013-03-22 NOTE — Telephone Encounter (Signed)
Please call in

## 2013-03-22 NOTE — Telephone Encounter (Signed)
Rx phoned to pharmacy.  

## 2013-04-10 ENCOUNTER — Encounter: Payer: Self-pay | Admitting: Radiology

## 2013-04-11 ENCOUNTER — Encounter: Payer: Self-pay | Admitting: Family Medicine

## 2013-04-11 ENCOUNTER — Other Ambulatory Visit: Payer: Self-pay

## 2013-04-11 ENCOUNTER — Ambulatory Visit (INDEPENDENT_AMBULATORY_CARE_PROVIDER_SITE_OTHER): Payer: Medicare Other | Admitting: Family Medicine

## 2013-04-11 VITALS — BP 132/76 | HR 69 | Temp 98.1°F | Ht 60.0 in | Wt 181.5 lb

## 2013-04-11 DIAGNOSIS — G4700 Insomnia, unspecified: Secondary | ICD-10-CM

## 2013-04-11 DIAGNOSIS — F410 Panic disorder [episodic paroxysmal anxiety] without agoraphobia: Secondary | ICD-10-CM

## 2013-04-11 DIAGNOSIS — Z0000 Encounter for general adult medical examination without abnormal findings: Secondary | ICD-10-CM

## 2013-04-11 DIAGNOSIS — E785 Hyperlipidemia, unspecified: Secondary | ICD-10-CM

## 2013-04-11 DIAGNOSIS — Z23 Encounter for immunization: Secondary | ICD-10-CM

## 2013-04-11 DIAGNOSIS — E7800 Pure hypercholesterolemia, unspecified: Secondary | ICD-10-CM

## 2013-04-11 DIAGNOSIS — R011 Cardiac murmur, unspecified: Secondary | ICD-10-CM

## 2013-04-11 DIAGNOSIS — F17200 Nicotine dependence, unspecified, uncomplicated: Secondary | ICD-10-CM

## 2013-04-11 DIAGNOSIS — Z1231 Encounter for screening mammogram for malignant neoplasm of breast: Secondary | ICD-10-CM

## 2013-04-11 LAB — COMPREHENSIVE METABOLIC PANEL
ALT: 26 U/L (ref 0–35)
CO2: 26 mEq/L (ref 19–32)
Calcium: 9.3 mg/dL (ref 8.4–10.5)
Chloride: 98 mEq/L (ref 96–112)
Creatinine, Ser: 0.7 mg/dL (ref 0.4–1.2)
GFR: 89.2 mL/min (ref >60.00)
Sodium: 133 mEq/L — ABNORMAL LOW (ref 135–145)
Total Protein: 7.4 g/dL (ref 6.0–8.3)

## 2013-04-11 MED ORDER — ISOSORBIDE DINITRATE 20 MG PO TABS: ORAL_TABLET | ORAL | Status: DC

## 2013-04-11 MED ORDER — OMEPRAZOLE 20 MG PO CPDR: 20.0000 mg | DELAYED_RELEASE_CAPSULE | Freq: Every day | ORAL | Status: DC

## 2013-04-11 MED ORDER — ATENOLOL 25 MG PO TABS: 25.0000 mg | ORAL_TABLET | Freq: Every day | ORAL | Status: DC

## 2013-04-11 NOTE — Assessment & Plan Note (Signed)
Continue prn BZD, intolerant of mult other meds.

## 2013-04-11 NOTE — Progress Notes (Signed)
CPE- See plan.  Routine anticipatory guidance given to patient.  See health maintenance. She is cutting back on smoking, down to 1/3 PPD and trying to taper more.   Pap not needed.  Discussed.  DXA not due.  Mammogram due.  She'll call about scheduling.  She admits to being scared about this, with a history of a (benign) call back for extra imaging.  Tetanus shot d/w pt. She is due.   Flu shot done.  Diet and exercise discussed. She admits needing to work on both. Living will d/w pt.  Would have Tim Goins designated if incapacitated.    She's going to call about f/u with cards. She has had some occ chest heaviness but it is unclear about the source.  She has no sx now.  It comes on with exertion occ. It is unclear if stress on the homefront contributes to this.  See plan.    Elevated Cholesterol: Using medications without problems:yes Muscle aches: no Diet compliance: discussed Exercise: discussed Other complaints: no Due for labs.   RLS sx continue. Better if she gets up and walks.  Taking aleve qhs helps.    Insomnia better with ambien, no ADE from the medicine.    Anxiety.  Some better with xanax.  Was intolerant of other meds.  Discussed.  She has a lot of ongoing home improvements and this has been difficult. No SI/HI.   PMH and SH reviewed  Meds, vitals, and allergies reviewed.   ROS: See HPI.  Otherwise negative.    GEN: nad, alert and oriented HEENT: mucous membranes moist NECK: supple w/o LA CV: rrr. Soft murmur noted over the aortic area, new finding PULM: ctab, no inc wob ABD: soft, +bs EXT: no edema SKIN: no acute rash

## 2013-04-11 NOTE — Assessment & Plan Note (Addendum)
New murmur, faint.  Would proceed with echo.  We didn't get EKG as she was regular and had no sx now.  D/w pt.  She agrees. She'll call about routine cards f/u. She has had episodic chest heaviness but not frank chest pain and has no sx currently.

## 2013-04-11 NOTE — Patient Instructions (Addendum)
Call about getting a mammogram and going to the heart clinic.  Go to the lab on the way out.  We'll contact you with your lab report.  See Shirlee Limerick about your referral before you leave today. Take care.  Keep working on the smoking.

## 2013-04-11 NOTE — Assessment & Plan Note (Signed)
She's working to cut back.  Encouraged.

## 2013-04-11 NOTE — Assessment & Plan Note (Signed)
Routine anticipatory guidance given to patient.  See health maintenance. She is cutting back on smoking, down to 1/3 PPD and trying to taper more.   Pap not needed.  Discussed.  DXA not due.  Mammogram due.  She'll call about scheduling.  She admits to being scared about this, with a history of a (benign) call back for extra imaging.  Tetanus shot d/w pt. She is due.   Flu shot done.  Diet and exercise discussed. She admits needing to work on both. Living will d/w pt.  Would have Tim Goins designated if incapacitated.

## 2013-04-11 NOTE — Assessment & Plan Note (Signed)
See notes on labs.  Continue statin for now.

## 2013-04-11 NOTE — Assessment & Plan Note (Signed)
Manageable with ambien.  No ADE.  Continue.

## 2013-04-16 ENCOUNTER — Other Ambulatory Visit (HOSPITAL_COMMUNITY): Payer: Self-pay | Admitting: Family Medicine

## 2013-04-16 DIAGNOSIS — R011 Cardiac murmur, unspecified: Secondary | ICD-10-CM

## 2013-04-18 ENCOUNTER — Encounter: Payer: Self-pay | Admitting: *Deleted

## 2013-04-23 ENCOUNTER — Ambulatory Visit (HOSPITAL_COMMUNITY): Payer: Medicare Other | Attending: Cardiology

## 2013-04-23 DIAGNOSIS — R011 Cardiac murmur, unspecified: Secondary | ICD-10-CM

## 2013-04-23 NOTE — Progress Notes (Signed)
Echocardiogram performed.  

## 2013-04-24 ENCOUNTER — Telehealth: Payer: Self-pay

## 2013-04-24 NOTE — Telephone Encounter (Signed)
See notes on echo report.

## 2013-04-24 NOTE — Telephone Encounter (Signed)
Pt request cb with echo report done on 04/23/13.Please advise.

## 2013-04-25 ENCOUNTER — Ambulatory Visit
Admission: RE | Admit: 2013-04-25 | Discharge: 2013-04-25 | Disposition: A | Discharge status: Home / Self Care | Payer: Medicare Other | Source: Ambulatory Visit

## 2013-04-25 DIAGNOSIS — Z1231 Encounter for screening mammogram for malignant neoplasm of breast: Secondary | ICD-10-CM

## 2013-04-25 NOTE — Telephone Encounter (Signed)
Patient advised.

## 2013-04-29 ENCOUNTER — Encounter: Payer: Self-pay | Admitting: *Deleted

## 2013-06-06 ENCOUNTER — Other Ambulatory Visit: Payer: Self-pay | Admitting: Family Medicine

## 2013-06-20 ENCOUNTER — Other Ambulatory Visit (HOSPITAL_COMMUNITY): Payer: Self-pay | Admitting: Family Medicine

## 2013-06-20 NOTE — Telephone Encounter (Signed)
Electronic refill request.  Please advise. 

## 2013-06-21 ENCOUNTER — Other Ambulatory Visit (HOSPITAL_COMMUNITY): Payer: Self-pay | Admitting: Family Medicine

## 2013-06-21 NOTE — Telephone Encounter (Signed)
Rx called to pharmacy as instructed. 

## 2013-06-21 NOTE — Telephone Encounter (Signed)
Please call in

## 2013-08-29 ENCOUNTER — Ambulatory Visit (INDEPENDENT_AMBULATORY_CARE_PROVIDER_SITE_OTHER): Payer: Medicare Other

## 2013-08-29 DIAGNOSIS — Z23 Encounter for immunization: Secondary | ICD-10-CM

## 2013-09-20 ENCOUNTER — Ambulatory Visit (INDEPENDENT_AMBULATORY_CARE_PROVIDER_SITE_OTHER): Payer: Medicare Other | Admitting: Family Medicine

## 2013-09-20 ENCOUNTER — Encounter: Payer: Self-pay | Admitting: Family Medicine

## 2013-09-20 ENCOUNTER — Other Ambulatory Visit: Payer: Self-pay | Admitting: Family Medicine

## 2013-09-20 VITALS — BP 120/74 | HR 57 | Temp 98.2°F | Ht 60.0 in | Wt 187.0 lb

## 2013-09-20 DIAGNOSIS — J069 Acute upper respiratory infection, unspecified: Secondary | ICD-10-CM

## 2013-09-20 MED ORDER — GUAIFENESIN-CODEINE 100-10 MG/5ML PO SYRP: 5.0000 mL | ORAL_SOLUTION | Freq: Every evening | ORAL | Status: DC | PRN

## 2013-09-20 NOTE — Assessment & Plan Note (Signed)
Reviewed viral timeline.  Symptomatic care.

## 2013-09-20 NOTE — Patient Instructions (Addendum)
Cough suppressant at night for cough. Rest, fluids. Mucinex Dm during the day.  Expect 7-10 days of illness, if new fever or if breathing changing.. Call.

## 2013-09-20 NOTE — Progress Notes (Signed)
  Subjective:    Patient ID: Andrea Webster, female    DOB: 09-Oct-1956, 57 y.o.   MRN: [id number]  Cough This is a new problem. The current episode started in the past 7 days. The problem occurs constantly. The cough is productive of sputum. Associated symptoms include chills, myalgias, nasal congestion, a sore throat, shortness of breath and wheezing. Pertinent negatives include no chest pain, ear congestion, ear pain, fever, postnasal drip, rash or rhinorrhea. The symptoms are aggravated by lying down (worse at night). Risk factors for lung disease include smoking/tobacco exposure (smoker, but limited). Treatments tried: nasonex. The treatment provided mild relief. Her past medical history is significant for bronchitis and environmental allergies. There is no history of asthma, COPD, emphysema or pneumonia.      Review of Systems  Constitutional: Positive for chills. Negative for fever.  HENT: Positive for sore throat. Negative for ear pain, postnasal drip and rhinorrhea.   Respiratory: Positive for cough, shortness of breath and wheezing.   Cardiovascular: Negative for chest pain.  Musculoskeletal: Positive for myalgias.  Skin: Negative for rash.  Allergic/Immunologic: Positive for environmental allergies.       Objective:   Physical Exam  Constitutional: Vital signs are normal. She appears well-developed and well-nourished. She is cooperative.  Non-toxic appearance. She does not appear ill. No distress.  HENT:  Head: Normocephalic.  Right Ear: Hearing, tympanic membrane, external ear and ear canal normal. Tympanic membrane is not erythematous, not retracted and not bulging.  Left Ear: Hearing, tympanic membrane, external ear and ear canal normal. Tympanic membrane is not erythematous, not retracted and not bulging.  Nose: Mucosal edema and rhinorrhea present. Right sinus exhibits no maxillary sinus tenderness and no frontal sinus tenderness. Left sinus exhibits no maxillary sinus  tenderness and no frontal sinus tenderness.  Mouth/Throat: Uvula is midline, oropharynx is clear and moist and mucous membranes are normal.  Eyes: Conjunctivae, EOM and lids are normal. Pupils are equal, round, and reactive to light. Lids are everted and swept, no foreign bodies found.  Neck: Trachea normal and normal range of motion. Neck supple. Carotid bruit is not present. No mass and no thyromegaly present.  Cardiovascular: Normal rate, regular rhythm, S1 normal, S2 normal, normal heart sounds, intact distal pulses and normal pulses.  Exam reveals no gallop and no friction rub.   No murmur heard. Pulmonary/Chest: Effort normal and breath sounds normal. Not tachypneic. No respiratory distress. She has no decreased breath sounds. She has no wheezes. She has no rhonchi. She has no rales.  Neurological: She is alert.  Skin: Skin is warm, dry and intact. No rash noted.  Psychiatric: Her speech is normal and behavior is normal. Judgment normal. Her mood appears not anxious. Cognition and memory are normal. She does not exhibit a depressed mood.          Assessment & Plan:

## 2013-09-20 NOTE — Telephone Encounter (Signed)
Electronic refill request.  Please advise. 

## 2013-09-22 NOTE — Telephone Encounter (Signed)
Please call in

## 2013-09-23 ENCOUNTER — Other Ambulatory Visit (HOSPITAL_COMMUNITY): Payer: Self-pay | Admitting: Family Medicine

## 2013-09-23 NOTE — Telephone Encounter (Signed)
Medication phoned to pharmacy.  

## 2013-10-22 ENCOUNTER — Other Ambulatory Visit (HOSPITAL_COMMUNITY): Payer: Self-pay | Admitting: Family Medicine

## 2013-10-22 NOTE — Telephone Encounter (Signed)
Electronic refill request.  Please advise. 

## 2013-10-22 NOTE — Telephone Encounter (Signed)
Please call in

## 2013-10-23 ENCOUNTER — Other Ambulatory Visit (HOSPITAL_COMMUNITY): Payer: Self-pay | Admitting: Family Medicine

## 2013-10-23 NOTE — Telephone Encounter (Signed)
Rx called to pharmacy

## 2013-10-24 NOTE — Telephone Encounter (Signed)
Pt left vm saying that she could not pick up RX because [facility] had questions about it.  Spoke to [employer], they said that the voicemail left was generic and did not include qty. And refills allowed.  I gave them the needed info and called pt to inform her RX should be ready to be picked up today.

## 2013-12-25 ENCOUNTER — Other Ambulatory Visit: Payer: Self-pay | Admitting: Family Medicine

## 2013-12-25 NOTE — Telephone Encounter (Signed)
Last filled 12/13/12

## 2013-12-25 NOTE — Telephone Encounter (Signed)
Declined both.  Too early to fill.

## 2013-12-26 ENCOUNTER — Other Ambulatory Visit: Payer: Self-pay

## 2013-12-26 NOTE — Telephone Encounter (Signed)
Some of these medications were filled locally at [facility] in 10/14 and the others 5/14-- Pharmacy changed to mail order--Is it okay to fill?--please advise

## 2013-12-27 MED ORDER — ISOSORBIDE DINITRATE 20 MG PO TABS: ORAL_TABLET | ORAL | Status: DC

## 2013-12-27 MED ORDER — SIMVASTATIN 20 MG PO TABS: 20.0000 mg | ORAL_TABLET | Freq: Every day | ORAL | Status: DC

## 2013-12-27 MED ORDER — ALBUTEROL SULFATE HFA 108 (90 BASE) MCG/ACT IN AERS: 2.0000 | INHALATION_SPRAY | Freq: Four times a day (QID) | RESPIRATORY_TRACT | Status: DC | PRN

## 2013-12-27 MED ORDER — ATENOLOL 25 MG PO TABS: 25.0000 mg | ORAL_TABLET | Freq: Every day | ORAL | Status: DC

## 2013-12-27 MED ORDER — OMEPRAZOLE 20 MG PO CPDR: 20.0000 mg | DELAYED_RELEASE_CAPSULE | Freq: Every day | ORAL | Status: DC

## 2013-12-27 NOTE — Telephone Encounter (Signed)
.  left message to have patient return my call.  

## 2013-12-27 NOTE — Telephone Encounter (Signed)
Sent, schedule CPE.  Thanks.  

## 2013-12-30 ENCOUNTER — Other Ambulatory Visit: Payer: Self-pay | Admitting: *Deleted

## 2013-12-30 NOTE — Telephone Encounter (Signed)
Received faxed refill request from mail order pharmacy. Last office visit 09/20/13/acute visit. Is it okay to refill medication?

## 2013-12-30 NOTE — Telephone Encounter (Signed)
She needs to transfer the other old rxs or have the pharmacy cancel the remaining refills on the old rxs before we send these in.  Let me know if these need to be printed and then faxed in. Thanks.

## 2013-12-31 NOTE — Telephone Encounter (Signed)
Left message on voice mail  to call back

## 2014-01-01 NOTE — Telephone Encounter (Signed)
Left message on voice mail  to call back

## 2014-01-01 NOTE — Telephone Encounter (Signed)
Have left several messages for patient to call back.

## 2014-01-01 NOTE — Telephone Encounter (Signed)
Awaiting her call back.  Let me know when she calls back, see below.  Thanks.

## 2014-01-01 NOTE — Telephone Encounter (Signed)
Received faxed refill requests for Alprazolam and Zolpidem from [facility].

## 2014-01-03 NOTE — Telephone Encounter (Signed)
Spoke with patient.  She states these Rx's are at [facility], [facility], [place].  I called [facility] and canceled any remaining refills on the Alprazolam and Zolpidem.  Technician says she just picked these up last Tuesday but that any remaining refills will be canceled.  Please print Rx's for faxing to [facility].

## 2014-01-03 NOTE — Telephone Encounter (Signed)
Patient left VM and phone number of [phone].  No answer again and left detailed message on voicemail.

## 2014-01-05 MED ORDER — ALPRAZOLAM 1 MG PO TABS: ORAL_TABLET | ORAL | Status: DC

## 2014-01-05 MED ORDER — ZOLPIDEM TARTRATE 10 MG PO TABS
10.0000 mg | ORAL_TABLET | Freq: Every evening | ORAL | Status: DC | PRN
Start: ? — End: 2014-05-06

## 2014-01-05 NOTE — Telephone Encounter (Signed)
Printed.  Thanks.  

## 2014-01-06 NOTE — Telephone Encounter (Signed)
Rx's faxed to [facility].

## 2014-05-06 ENCOUNTER — Other Ambulatory Visit: Payer: Self-pay

## 2014-05-06 NOTE — Telephone Encounter (Signed)
Pt left v/m requesting alprazolam and zolpidem rx faxed to [facility].Please advise.

## 2014-05-07 MED ORDER — ALPRAZOLAM 1 MG PO TABS: ORAL_TABLET | ORAL | Status: DC

## 2014-05-07 MED ORDER — ZOLPIDEM TARTRATE 10 MG PO TABS: 10.0000 mg | ORAL_TABLET | Freq: Every evening | ORAL | Status: DC | PRN

## 2014-05-07 NOTE — Telephone Encounter (Signed)
Pt is aware as instructed and states she will have to get with her daughter to figure out a day and time that will work for her as she watches her granddaughter for the summer--i did let her know she would have to make the appt as soon as possible as the CPE appts are limited so that she will be able to get if before her current 13 day Rx are gone

## 2014-05-07 NOTE — Telephone Encounter (Signed)
Rx faxed to [facility]

## 2014-05-07 NOTE — Telephone Encounter (Signed)
Printed, needs to schedule a CPE.  Thanks.

## 2014-06-10 ENCOUNTER — Other Ambulatory Visit: Payer: Self-pay

## 2014-06-10 MED ORDER — ALPRAZOLAM 1 MG PO TABS: ORAL_TABLET | ORAL | Status: DC

## 2014-06-10 MED ORDER — ZOLPIDEM TARTRATE 10 MG PO TABS: 10.0000 mg | ORAL_TABLET | Freq: Every evening | ORAL | Status: DC | PRN

## 2014-06-10 NOTE — Telephone Encounter (Signed)
Faxed to mail order [facility])

## 2014-06-10 NOTE — Telephone Encounter (Signed)
Printed, please send when I sign off. Thanks.

## 2014-06-10 NOTE — Telephone Encounter (Signed)
Pt left v/m requesting refills on alprazolam and ambien sent to [facility] order pharmacy.Please advise. Pt has CPX scheduled for 06/30/14.

## 2014-06-16 ENCOUNTER — Other Ambulatory Visit: Payer: Self-pay | Admitting: Family Medicine

## 2014-06-30 ENCOUNTER — Encounter: Payer: Self-pay | Admitting: Radiology

## 2014-06-30 ENCOUNTER — Encounter: Payer: Self-pay | Admitting: Family Medicine

## 2014-06-30 ENCOUNTER — Telehealth: Payer: Self-pay | Admitting: Family Medicine

## 2014-06-30 ENCOUNTER — Ambulatory Visit (INDEPENDENT_AMBULATORY_CARE_PROVIDER_SITE_OTHER): Payer: Commercial Managed Care - HMO | Admitting: Family Medicine

## 2014-06-30 VITALS — BP 114/80 | HR 60 | Temp 97.7°F | Ht 59.25 in | Wt 190.5 lb

## 2014-06-30 DIAGNOSIS — H8110 Benign paroxysmal vertigo, unspecified ear: Secondary | ICD-10-CM

## 2014-06-30 DIAGNOSIS — I2510 Atherosclerotic heart disease of native coronary artery without angina pectoris: Secondary | ICD-10-CM

## 2014-06-30 DIAGNOSIS — F410 Panic disorder [episodic paroxysmal anxiety] without agoraphobia: Secondary | ICD-10-CM

## 2014-06-30 DIAGNOSIS — Z0000 Encounter for general adult medical examination without abnormal findings: Secondary | ICD-10-CM

## 2014-06-30 DIAGNOSIS — Z1211 Encounter for screening for malignant neoplasm of colon: Secondary | ICD-10-CM

## 2014-06-30 DIAGNOSIS — E785 Hyperlipidemia, unspecified: Secondary | ICD-10-CM

## 2014-06-30 DIAGNOSIS — E7800 Pure hypercholesterolemia, unspecified: Secondary | ICD-10-CM

## 2014-06-30 DIAGNOSIS — Z7189 Other specified counseling: Secondary | ICD-10-CM

## 2014-06-30 DIAGNOSIS — F17200 Nicotine dependence, unspecified, uncomplicated: Secondary | ICD-10-CM

## 2014-06-30 DIAGNOSIS — G4700 Insomnia, unspecified: Secondary | ICD-10-CM

## 2014-06-30 LAB — COMPREHENSIVE METABOLIC PANEL
ALBUMIN: 4.4 g/dL (ref 3.5–5.2)
ALK PHOS: 68 U/L (ref 39–117)
ALT: 22 U/L (ref 0–35)
AST: 18 U/L (ref 0–37)
BILIRUBIN TOTAL: 0.9 mg/dL (ref 0.2–1.2)
BUN: 9 mg/dL (ref 6–23)
CO2: 27 mEq/L (ref 19–32)
Calcium: 10 mg/dL (ref 8.4–10.5)
Chloride: 99 mEq/L (ref 96–112)
Creatinine, Ser: 0.7 mg/dL (ref 0.4–1.2)
GFR: 88.81 mL/min (ref >60.00)
Glucose, Bld: 97 mg/dL (ref 70–99)
POTASSIUM: 4.5 meq/L (ref 3.5–5.1)
SODIUM: 135 meq/L (ref 135–145)
TOTAL PROTEIN: 7.4 g/dL (ref 6.0–8.3)

## 2014-06-30 LAB — LIPID PANEL
CHOL/HDL RATIO: 4
Cholesterol: 247 mg/dL — ABNORMAL HIGH (ref 0–200)
HDL: 57.8 mg/dL (ref >39.00)
LDL CALC: 152 mg/dL — AB (ref 0–99)
NONHDL: 189.2
Triglycerides: 187 mg/dL — ABNORMAL HIGH (ref 0.0–149.0)
VLDL: 37.4 mg/dL (ref 0.0–40.0)

## 2014-06-30 MED ORDER — ISOSORBIDE DINITRATE 20 MG PO TABS: ORAL_TABLET | ORAL | Status: DC

## 2014-06-30 MED ORDER — ZOLPIDEM TARTRATE 10 MG PO TABS: 10.0000 mg | ORAL_TABLET | Freq: Every evening | ORAL | Status: DC | PRN

## 2014-06-30 MED ORDER — ALPRAZOLAM 1 MG PO TABS: ORAL_TABLET | ORAL | Status: DC

## 2014-06-30 NOTE — Progress Notes (Signed)
Pre visit review using our clinic review tool, if applicable. No additional management support is needed unless otherwise documented below in the visit note.  I have personally reviewed the Medicare Annual Wellness questionnaire and have noted 1. The patient's medical and social history 2. Their use of alcohol, tobacco or illicit drugs 3. Their current medications and supplements 4. The patient's functional ability including ADL's, fall risks, home safety risks and hearing or visual             impairment. 5. Diet and physical activities 6. Evidence for depression or mood disorders  The patients weight, height, BMI have been recorded in the chart and visual acuity is per eye clinic.  I have made referrals, counseling and provided education to the patient based review of the above and I have provided the pt with a written personalized care plan for preventive services.  Provider list updated- see scanned forms.  Routine anticipatory guidance given to patient.  See health maintenance.  Flu 2014 Shingles due at 61  PNA 2013 Tetanus 2014 D/w patient [id number] for colon cancer screening, including IFOB vs. colonoscopy.  Risks and benefits of both were discussed and patient voiced understanding.  Pt elects ZGY:FVCB Breast cancer screening = mammogram 2014 Advance directive  D/w pt.  [name] designated if patient were incapacitated.   Cognitive function addressed- see scanned forms- and if abnormal then additional documentation follows.  She is smoking less, down to less than 1/3 of a PPD.  She is working on this.   Anxiety- son prev in jail, continues with family stressors. Safe at home. No ADE on meds. Intolerant of SSRI and wellbutrin. No SI/HI.  Xanax helps some, to make it manageable.   Elevated Cholesterol: Using medications without problems:yes Muscle aches: some, intermittent, so likely not from statin.   Diet compliance: yes, started back on low fat diet recent.  Exercise:  walking. Encouraged.   Hypertension:    Using medication without problems or lightheadedness: yes Chest pain with exertion:no Edema:occ R leg edema, at baseline Short of breath:no  Some vertigo sx, with position changes. Not lightheaded.  Can happen with rolling over in bed. Looking up can make it happen. No sx with eye tracking.   Insomnia.  No ADE on med.  Good effect.  Wants to continue.  Not oversedated.   PMH and SH reviewed  Meds, vitals, and allergies reviewed.   ROS: See HPI.  Otherwise negative.    GEN: nad, alert and oriented HEENT: mucous membranes moist NECK: supple w/o LA CV: rrr. PULM: ctab, no inc wob ABD: soft, +bs EXT: no edema SKIN: no acute rash

## 2014-06-30 NOTE — Telephone Encounter (Signed)
Relevant patient education assigned to patient using Emmi. ° °

## 2014-06-30 NOTE — Patient Instructions (Addendum)
Go to the lab on the way out.  We'll contact you with your lab report. Call about a mammogram.  I would get a flu shot each fall.   Use the bedside exercise for the vertigo- this is likely BPV and it should get better.  Take care.  Glad to see you.

## 2014-07-01 DIAGNOSIS — H8110 Benign paroxysmal vertigo, unspecified ear: Secondary | ICD-10-CM | POA: Insufficient documentation

## 2014-07-01 DIAGNOSIS — Z7189 Other specified counseling: Secondary | ICD-10-CM | POA: Insufficient documentation

## 2014-07-01 NOTE — Assessment & Plan Note (Signed)
Family stressors noted, safe at home, continue current meds.  Okay for outpatient f/u.

## 2014-07-01 NOTE — Assessment & Plan Note (Signed)
See notes on labs, continue statin.  D/w pt.  She agrees.  Encouraged diet and exercise, weight loss.

## 2014-07-01 NOTE — Assessment & Plan Note (Signed)
Continue as is, no ADE on meds.  D/w pt.

## 2014-07-01 NOTE — Assessment & Plan Note (Signed)
Per old report of patient.  Continue current meds for BP.  No CP.  Okay for outpatient f/u.

## 2014-07-01 NOTE — Assessment & Plan Note (Signed)
Likely, d/w pt.  Can use bedside exercises prn.  She agrees.  F/u prn.

## 2014-07-01 NOTE — Assessment & Plan Note (Signed)
Flu 2014 Shingles due at 61  PNA 2013 Tetanus 2014 D/w patient UX:[id number] for colon cancer screening, including IFOB vs. colonoscopy.  Risks and benefits of both were discussed and patient voiced understanding.  Pt elects NVB:TYOM Breast cancer screening = mammogram 2014 Advance directive  D/w pt.  [name] designated if patient were incapacitated.   Cognitive function addressed- see scanned forms- and if abnormal then additional documentation follows.  She is smoking less, down to less than 1/3 of a PPD.  She is working on this.

## 2014-07-01 NOTE — Assessment & Plan Note (Signed)
Advance directive  D/w pt.  Andrea Webster designated if patient were incapacitated ?

## 2014-07-01 NOTE — Assessment & Plan Note (Signed)
Encouraged cessation. D/w pt.

## 2014-07-16 ENCOUNTER — Encounter: Payer: Self-pay | Admitting: Family Medicine

## 2014-07-30 ENCOUNTER — Encounter: Payer: Self-pay | Admitting: Family Medicine

## 2014-07-30 ENCOUNTER — Ambulatory Visit (INDEPENDENT_AMBULATORY_CARE_PROVIDER_SITE_OTHER): Payer: Commercial Managed Care - HMO | Admitting: Family Medicine

## 2014-07-30 VITALS — BP 116/76 | HR 63 | Temp 97.6°F | Wt 190.5 lb

## 2014-07-30 DIAGNOSIS — M25561 Pain in right knee: Secondary | ICD-10-CM

## 2014-07-30 DIAGNOSIS — M25569 Pain in unspecified knee: Secondary | ICD-10-CM

## 2014-07-30 NOTE — Patient Instructions (Signed)
[name] will call about your referral. Try to take it easy in the meantime.  Glad to see you.

## 2014-07-30 NOTE — Progress Notes (Signed)
Pre visit review using our clinic review tool, if applicable. No additional management support is needed unless otherwise documented below in the visit note.  She is smoking less, <1/3 PPD.  I thanked her for her effort.   R knee pain.  She was helping a family friend, an elderly lady, sitting with her.  Patient fell, to keep the other lady from falling.  She went down straight ahead during the event.  Happened 4 weeks ago.  Tried ice, heat, icy hot, ibuprofen, tried a knee brace.  No help.  Heard a pop when falling.  It didn't bruise but it swell up, some better now.  Some days pain and swelling are better than others. Audible pop with ROM now.  Worse with weather changes.  She gets some locking in the knee joint.    Meds, vitals, and allergies reviewed.   ROS: See HPI.  Otherwise, noncontributory.  nad Able to bear weight R knee with normal inspection, not ttp on the joint line, no crepitus ACL MCL LCL feel solid but pain on testing meniscus

## 2014-07-31 NOTE — Assessment & Plan Note (Signed)
D/w pt. Concern for meniscus tear, we didn't image today as we can't do weight bearing films. Limit walking on uneven ground in the meantime and refer to ortho. She agrees.

## 2014-08-08 ENCOUNTER — Ambulatory Visit (INDEPENDENT_AMBULATORY_CARE_PROVIDER_SITE_OTHER): Payer: Commercial Managed Care - HMO | Admitting: Family Medicine

## 2014-08-08 ENCOUNTER — Encounter: Payer: Self-pay | Admitting: Family Medicine

## 2014-08-08 VITALS — BP 180/90 | HR 118 | Temp 100.8°F | Wt 189.0 lb

## 2014-08-08 DIAGNOSIS — J0100 Acute maxillary sinusitis, unspecified: Secondary | ICD-10-CM

## 2014-08-08 MED ORDER — AMOXICILLIN-POT CLAVULANATE 875-125 MG PO TABS: 1.0000 | ORAL_TABLET | Freq: Two times a day (BID) | ORAL | Status: DC

## 2014-08-08 NOTE — Patient Instructions (Addendum)
Start the antibiotics today and use the inhaler if needed.  Update [place] next week.   Check your BP out of the clinic next week and notify us if >140/>90. Take care. Glad to see you.

## 2014-08-08 NOTE — Progress Notes (Signed)
Pre visit review using our clinic review tool, if applicable. No additional management support is needed unless otherwise documented below in the visit note.  Sx started yesterday, worse today.  Fevers, chills.  Muscles aching.  Vomiting.  No diarrhea.  No ear pain.  No ST.  Cough noted.  Some sputum, brown.  Chest sore from coughing.  Not SOB unless she is exerting herself and her inhaler helped with that- noted last night. Not SOB now.  Last used inhaler last night.    Meds, vitals, and allergies reviewed.   ROS: See HPI.  Otherwise, noncontributory.  GEN: nad, alert and oriented HEENT: mucous membranes moist, tm w/o erythema, nasal exam w/o erythema but stuffy, clear discharge noted,  OP with cobblestoning, sinuses ttp NECK: supple w/o LA CV: rrr.   PULM: ctab, no inc wob EXT: no edema SKIN: no acute rash

## 2014-08-10 DIAGNOSIS — J019 Acute sinusitis, unspecified: Secondary | ICD-10-CM | POA: Insufficient documentation

## 2014-08-10 NOTE — Assessment & Plan Note (Signed)
Likely with cough from post nasal gtt.  Nontoxic, but given the exam and the sinus tenderness would treat.  D/w pt.  She agrees.  Okay for outpatient f/u.  Use SABA prn.  No wheeze, no focal dec in BS, no inc in wob. Suspect BP elevation from illness, recheck with SBP 170.  She'll notify us if BP isn't improved as she feels better.

## 2014-08-15 ENCOUNTER — Telehealth: Payer: Self-pay

## 2014-08-15 NOTE — Telephone Encounter (Signed)
Would finish the abx and then go from there.  I wouldn't change meds yet.  Thanks.

## 2014-08-15 NOTE — Telephone Encounter (Signed)
Patient notified as instructed by telephone. Pt will call next week with update; sooner if condition changes or worsens.

## 2014-08-15 NOTE — Telephone Encounter (Signed)
Pt was seen 08/08/14; pt is still taking antibiotic and should finish in 3 days; pt has prod cough with brownish phlegm; pt blowing her nose a lot;wheezing most of time and pt is using inhaler and that helps somewhat. No SOB or CP and no fever. BP now 109/60. Pt is not sure if augmentin is strong enough. Haena Pt request cb.

## 2014-08-22 ENCOUNTER — Ambulatory Visit: Payer: Commercial Managed Care - HMO | Admitting: Family Medicine

## 2014-08-26 ENCOUNTER — Ambulatory Visit: Payer: Commercial Managed Care - HMO | Admitting: Family Medicine

## 2014-10-03 ENCOUNTER — Ambulatory Visit (INDEPENDENT_AMBULATORY_CARE_PROVIDER_SITE_OTHER): Payer: Commercial Managed Care - HMO

## 2014-10-03 DIAGNOSIS — Z23 Encounter for immunization: Secondary | ICD-10-CM

## 2014-10-28 ENCOUNTER — Other Ambulatory Visit: Payer: Self-pay | Admitting: Family Medicine

## 2014-10-29 NOTE — Telephone Encounter (Signed)
Received refill request electronically from pharmacy. Last office visit 08/08/14. Is it okay to refill Proventil?

## 2014-10-30 NOTE — Telephone Encounter (Signed)
Sent, thanks

## 2015-01-06 ENCOUNTER — Other Ambulatory Visit: Payer: Self-pay | Admitting: *Deleted

## 2015-01-06 MED ORDER — ZOLPIDEM TARTRATE 10 MG PO TABS: 10.0000 mg | ORAL_TABLET | Freq: Every evening | ORAL | Status: DC | PRN

## 2015-01-06 MED ORDER — ALPRAZOLAM 1 MG PO TABS: ORAL_TABLET | ORAL | Status: DC

## 2015-01-06 NOTE — Telephone Encounter (Signed)
Patient left a voicemail requesting a refill on her Alprazolam, last filled 06/30/14 #90/5 refills, Ambien, last refill 06/30/14 #30/5 refills. Last office visit 08/08/14. Is it okay to refill medication? Patient request that refill be sent to [facility].

## 2015-01-06 NOTE — Telephone Encounter (Signed)
Both printed.  Please send in.  Thanks.

## 2015-01-07 NOTE — Telephone Encounter (Signed)
Faxed to [facility] as instructed.

## 2015-03-23 ENCOUNTER — Other Ambulatory Visit: Payer: Self-pay | Admitting: Family Medicine

## 2015-03-30 ENCOUNTER — Other Ambulatory Visit: Payer: Self-pay | Admitting: *Deleted

## 2015-03-30 NOTE — Telephone Encounter (Signed)
Received refill request electronically from pharmacy Last refill 08/08/14 Last office visit 10/30/14 #3/1 Is it okay to refill medication?

## 2015-03-31 MED ORDER — ALBUTEROL SULFATE HFA 108 (90 BASE) MCG/ACT IN AERS: INHALATION_SPRAY | RESPIRATORY_TRACT | Status: DC

## 2015-03-31 NOTE — Telephone Encounter (Signed)
Patient advised.   Patient states she will call back for an appt because she is having more frequent problems.

## 2015-03-31 NOTE — Telephone Encounter (Signed)
Sent but if she is needing that often, then needs f/u OV to discuss.  Thanks.

## 2015-04-02 ENCOUNTER — Telehealth: Payer: Self-pay | Admitting: *Deleted

## 2015-04-02 MED ORDER — ALBUTEROL SULFATE HFA 108 (90 BASE) MCG/ACT IN AERS: 2.0000 | INHALATION_SPRAY | Freq: Four times a day (QID) | RESPIRATORY_TRACT | Status: DC | PRN

## 2015-04-02 NOTE — Telephone Encounter (Signed)
rx changed.  Form shredded.

## 2015-04-02 NOTE — Telephone Encounter (Signed)
Patient has requested a lower cost alternative medication.  Form is in your In Box.

## 2015-04-14 ENCOUNTER — Telehealth: Payer: Self-pay | Admitting: Family Medicine

## 2015-04-14 NOTE — Telephone Encounter (Signed)
Nurse Assessment Nurse: Donalynn Furlong, RN, Myna Hidalgo Date/Time Andrea Webster Time): 04/14/2015 9:58:02 AM Confirm and document reason for call. If symptomatic, describe symptoms. ---Caller States she is having chest pressure, does not have any radiating pain, x 1 1/2 week. No SOB, no indigestion Pt also takes an acid-reducer. No palpitations or fluttering Has the patient traveled out of the country within the last 30 days? ---No Does the patient require triage? ---Yes Related visit to physician within the last 2 weeks? ---No Does the PT have any chronic conditions? (i.e. diabetes, asthma, etc.) ---Yes List chronic conditions. ---GERD, high blood pressure, depression, heart murmur, has had angioplasty x 25 yrs ago. Guidelines Guideline Title Affirmed Question Affirmed Notes Chest Pain [1] Intermittent chest pain from "angina" AND [2] NO increase in severity or frequency Final Disposition User See PCP When Office is Open (within 3 days) Donalynn Furlong, RN, Myna Hidalgo Comments TC back to pt with appt 5/25 at 9:15 am with Dr Danise Mina to confirm

## 2015-04-14 NOTE — Telephone Encounter (Signed)
Pt has appt to see Dr Darnell Level on 04/15/15 at 9:15 AM.

## 2015-04-14 NOTE — Telephone Encounter (Signed)
Left message for patient to return call.

## 2015-04-14 NOTE — Telephone Encounter (Signed)
Agree, I have open slots today.

## 2015-04-14 NOTE — Telephone Encounter (Signed)
Patient notified as instructed by telephone and verbalized understanding. Offered patient an appointment today which she declined. Patient stated that she has followed the instructions given to her earlier by the nurse and feels fine now. Patient stated that she feels that all of this has been brought on by family issues and stress. Patient stated that she prefers to keep the appointment in the morning with Dr. Darnell Level. Patient stated that if she has any more symptoms today or tonight before the appointment tomorrow she will go straight to the ER.

## 2015-04-14 NOTE — Telephone Encounter (Signed)
Would suggest she be seen today for this - I and PCP have open slots available

## 2015-04-15 ENCOUNTER — Encounter: Payer: Self-pay | Admitting: *Deleted

## 2015-04-15 ENCOUNTER — Ambulatory Visit (INDEPENDENT_AMBULATORY_CARE_PROVIDER_SITE_OTHER): Payer: Commercial Managed Care - HMO | Admitting: Family Medicine

## 2015-04-15 ENCOUNTER — Encounter: Payer: Self-pay | Admitting: Family Medicine

## 2015-04-15 VITALS — BP 144/78 | HR 65 | Temp 97.8°F | Wt 207.5 lb

## 2015-04-15 DIAGNOSIS — I209 Angina pectoris, unspecified: Secondary | ICD-10-CM | POA: Diagnosis not present

## 2015-04-15 DIAGNOSIS — R079 Chest pain, unspecified: Secondary | ICD-10-CM

## 2015-04-15 LAB — LIPID PANEL
CHOLESTEROL: 190 mg/dL (ref 0–200)
HDL: 54.6 mg/dL (ref >39.00)
LDL Cholesterol: 102 mg/dL — ABNORMAL HIGH (ref 0–99)
NonHDL: 135.4
Total CHOL/HDL Ratio: 3
Triglycerides: 166 mg/dL — ABNORMAL HIGH (ref 0.0–149.0)
VLDL: 33.2 mg/dL (ref 0.0–40.0)

## 2015-04-15 LAB — BASIC METABOLIC PANEL
BUN: 13 mg/dL (ref 6–23)
CO2: 25 meq/L (ref 19–32)
Calcium: 9.3 mg/dL (ref 8.4–10.5)
Chloride: 100 mEq/L (ref 96–112)
Creatinine, Ser: 0.75 mg/dL (ref 0.40–1.20)
GFR: 84.48 mL/min (ref >60.00)
GLUCOSE: 110 mg/dL — AB (ref 70–99)
POTASSIUM: 3.9 meq/L (ref 3.5–5.1)
Sodium: 134 mEq/L — ABNORMAL LOW (ref 135–145)

## 2015-04-15 LAB — TSH: TSH: 2.84 u[IU]/mL (ref 0.35–4.50)

## 2015-04-15 LAB — TROPONIN I: TNIDX: 0.02 ug/l (ref 0.00–0.06)

## 2015-04-15 NOTE — Progress Notes (Signed)
BP 144/78 mmHg  Pulse 65  Temp(Src) 97.8 F (36.6 C) (Oral)  Wt 207 lb 8 oz (94.121 kg)  SpO2 96%   CC: chest pressure  Subjective:    Patient ID: Andrea Webster, female    DOB: May 16, 1956, 59 y.o.   MRN: [id number]  HPI: Andrea Webster is a 59 y.o. female presenting on 04/15/2015 for Chest Pain   Pt of Dr Josefine Class new to me presents with 2 wk h/o chest discomfort described as pressure worse with exertion. Denies dyspnea, jaw or left arm pain. Pressure improves with rest. Discomfort lasts max 1-2 min. Has been nauseated twice in the past few weeks, not associated with chest pain. Today noticing increased discomfort not associated with exertion this morning while getting dressed. Currently in no pain.   Increased stress recently - taking on more responsibilities at home and work. Recently found out son in trouble with the law in [place]. Pain started after this. Known anxiety and depression.  Known CAD s/p MI/angioplasty 1997 (told CA were too small for stent). Last saw cardiology 2012. On isosorbide 1/2 tablet of 20mg  daily, zocor 20mg  nightly and aspirin daily. Also on omeprazole 20mg  daily for known GERD. Strong fmhx CAD - mother 61, father 62. 2d Echo 04/2013 - mild LVH, EF 60%, normal wall motion. LV diast dysfunct. AV sclerosis without stenosis and mild AR.   Quit smoking 01/2015! Quit together with husband.  Relevant past medical, surgical, family and social history reviewed and updated as indicated. Interim medical history since our last visit reviewed. Allergies and medications reviewed and updated. Current Outpatient Prescriptions on File Prior to Visit  Medication Sig  . albuterol (PROVENTIL HFA;VENTOLIN HFA) 108 (90 BASE) MCG/ACT inhaler Inhale 2 puffs into the lungs every 6 (six) hours as needed for wheezing or shortness of breath.  . ALPRAZolam (XANAX) 1 MG tablet TAKE ONE TABLET BY MOUTH THREE TIMES DAILY AS NEEDED FOR ANXIETY  . amoxicillin-clavulanate (AUGMENTIN) 875-125 MG  per tablet Take 1 tablet by mouth 2 (two) times daily.  Marland Kitchen aspirin 81 MG tablet Take 81 mg by mouth daily.    Marland Kitchen atenolol (TENORMIN) 25 MG tablet TAKE 1 TABLET EVERY DAY  . Multiple Vitamin (MULTIVITAMIN) tablet Take 1 tablet by mouth daily.    Marland Kitchen omeprazole (PRILOSEC) 20 MG capsule TAKE 1 CAPSULE EVERY DAY  . senna (SENOKOT) 8.6 MG tablet Take 1 tablet by mouth as needed.    . simvastatin (ZOCOR) 20 MG tablet TAKE 1 TABLET  DAILY AT 6 PM.  . zolpidem (AMBIEN) 10 MG tablet Take 1 tablet (10 mg total) by mouth at bedtime as needed for sleep.   No current facility-administered medications on file prior to visit.    Review of Systems Per HPI unless specifically indicated above     Objective:    BP 144/78 mmHg  Pulse 65  Temp(Src) 97.8 F (36.6 C) (Oral)  Wt 207 lb 8 oz (94.121 kg)  SpO2 96%  Wt Readings from Last 3 Encounters:  04/15/15 207 lb 8 oz (94.121 kg)  08/08/14 189 lb (85.73 kg)  07/30/14 190 lb 8 oz (86.41 kg)    Physical Exam  Constitutional: She appears well-developed and well-nourished. No distress.  HENT:  Mouth/Throat: Oropharynx is clear and moist. No oropharyngeal exudate.  Neck: Normal range of motion. Neck supple. No thyromegaly present.  Cardiovascular: Normal rate, regular rhythm and intact distal pulses.   Murmur (1/6 SEM at LUSB) heard. Pulmonary/Chest: Effort normal and breath sounds  normal. No respiratory distress. She has no wheezes. She has no rales.  Musculoskeletal: She exhibits no edema.  Lymphadenopathy:    She has no cervical adenopathy.  Skin: Skin is warm and dry. No rash noted.  Psychiatric: She has a normal mood and affect.  Nursing note and vitals reviewed.  Results for orders placed or performed in visit on 06/30/14  Comprehensive metabolic panel  Result Value Ref Range   Sodium 135 135 - 145 mEq/L   Potassium 4.5 3.5 - 5.1 mEq/L   Chloride 99 96 - 112 mEq/L   CO2 27 19 - 32 mEq/L   Glucose, Bld 97 70 - 99 mg/dL   BUN 9 6 - 23 mg/dL     Creatinine, Ser 0.7 0.4 - 1.2 mg/dL   Total Bilirubin 0.9 0.2 - 1.2 mg/dL   Alkaline Phosphatase 68 39 - 117 U/L   AST 18 0 - 37 U/L   ALT 22 0 - 35 U/L   Total Protein 7.4 6.0 - 8.3 g/dL   Albumin 4.4 3.5 - 5.2 g/dL   Calcium 10.0 8.4 - 10.5 mg/dL   GFR 88.81 >60.00 mL/min  Lipid panel  Result Value Ref Range   Cholesterol 247 (H) 0 - 200 mg/dL   Triglycerides 187.0 (H) 0.0 - 149.0 mg/dL   HDL 57.80 >39.00 mg/dL   VLDL 37.4 0.0 - 40.0 mg/dL   LDL Cholesterol 152 (H) 0 - 99 mg/dL   Total CHOL/HDL Ratio 4    NonHDL 189.20    EKG - NSR rate 60s, normal axis, intervals, no acute ST/T changes. QRS voltage anteriorly improved compared to prior 2012.    Assessment & Plan:   Problem List Items Addressed This Visit    Angina pectoris - Primary    Concerning description of chest pain in ex smoker pt with CAD. bp improved on recheck. Will increase isosorbide to 20mg  one full tablet daily, continue atenolol, statin, and asa. Check stat TnI, as well as BMP, FLP, TSH today. Will expedite referral to cardiology for further evaluation. Advised against any exertion until seen by cardiology. To ER if worsening chest pain/pressure. Pt agrees with plan. EKG today reassuring.      Relevant Medications   isosorbide dinitrate (ISORDIL) 20 MG tablet   Other Relevant Orders   Ambulatory referral to Cardiology   Lipid panel   TSH   Basic metabolic panel   Troponin I    Other Visit Diagnoses    Chest pain, unspecified chest pain type        Relevant Orders    EKG 12-Lead (Completed)        Follow up plan: No Follow-up on file.

## 2015-04-15 NOTE — Patient Instructions (Addendum)
Labs today. EKG looking ok today. Pass by Marion's office for referral to heart doctor as soon as we can get you in. Increase isosorbide to 1 tablet daily until you see heart doctor. Continue other medicines as up to now.

## 2015-04-15 NOTE — Assessment & Plan Note (Signed)
Concerning description of chest pain in ex smoker pt with CAD. bp improved on recheck. Will increase isosorbide to 20mg  one full tablet daily, continue atenolol, statin, and asa. Check stat TnI, as well as BMP, FLP, TSH today. Will expedite referral to cardiology for further evaluation. Advised against any exertion until seen by cardiology. To ER if worsening chest pain/pressure. Pt agrees with plan. EKG today reassuring.

## 2015-04-15 NOTE — Progress Notes (Signed)
Pre visit review using our clinic review tool, if applicable. No additional management support is needed unless otherwise documented below in the visit note. 

## 2015-04-17 ENCOUNTER — Ambulatory Visit (INDEPENDENT_AMBULATORY_CARE_PROVIDER_SITE_OTHER): Payer: Commercial Managed Care - HMO | Admitting: Cardiovascular Disease

## 2015-04-17 ENCOUNTER — Encounter: Payer: Self-pay | Admitting: Cardiovascular Disease

## 2015-04-17 VITALS — BP 142/78 | HR 70 | Ht 60.0 in | Wt 204.8 lb

## 2015-04-17 DIAGNOSIS — R079 Chest pain, unspecified: Secondary | ICD-10-CM

## 2015-04-17 DIAGNOSIS — E785 Hyperlipidemia, unspecified: Secondary | ICD-10-CM

## 2015-04-17 DIAGNOSIS — I25118 Atherosclerotic heart disease of native coronary artery with other forms of angina pectoris: Secondary | ICD-10-CM | POA: Diagnosis not present

## 2015-04-17 NOTE — Progress Notes (Signed)
Primary care physician: Dr. Damita Dunnings  HPI  This is a 59 year old female who was referred by Dr. Danise Mina for evaluation of chest pain.  She has known history of coronary artery disease. She reports previous myocardial infarction at the age of 59 years old. She reports undergoing catheterization and balloon angioplasty in [place]. No cardiac events since then. She used to get regular stress tests but none in the last few years. She quit smoking 2 months ago. She has strong family history of coronary artery disease.  Over the last few weeks, she has experienced intermittent episodes of substernal chest tightness with activities and occasionally at rest. She noticed this with yard work and also when she was cleaning the church. She reports that the chest tightness is not always exertional and is not reproducible with certain activities. This has been associated with shortness of breath. She had an echocardiogram done in 2014 for a cardiac murmur which showed normal LV systolic function, mild aortic sclerosis without significant stenosis.    Allergies  Allergen Reactions  . Paroxetine Hcl   . Sertraline Hcl   . Wellbutrin [Bupropion Hcl]      Current Outpatient Prescriptions on File Prior to Visit  Medication Sig Dispense Refill  . albuterol (PROVENTIL HFA;VENTOLIN HFA) 108 (90 BASE) MCG/ACT inhaler Inhale 2 puffs into the lungs every 6 (six) hours as needed for wheezing or shortness of breath. 3 Inhaler 1  . ALPRAZolam (XANAX) 1 MG tablet TAKE ONE TABLET BY MOUTH THREE TIMES DAILY AS NEEDED FOR ANXIETY 270 tablet 1  . aspirin 81 MG tablet Take 81 mg by mouth daily.      Marland Kitchen atenolol (TENORMIN) 25 MG tablet TAKE 1 TABLET EVERY DAY 90 tablet 1  . isosorbide dinitrate (ISORDIL) 20 MG tablet TAKE 1 TABLET BY MOUTH DAILY 90 tablet 1  . Multiple Vitamin (MULTIVITAMIN) tablet Take 1 tablet by mouth daily.      Marland Kitchen omeprazole (PRILOSEC) 20 MG capsule TAKE 1 CAPSULE EVERY DAY 90 capsule 1  . senna  (SENOKOT) 8.6 MG tablet Take 1 tablet by mouth as needed.      . simvastatin (ZOCOR) 20 MG tablet TAKE 1 TABLET  DAILY AT 6 PM. 90 tablet 1  . zolpidem (AMBIEN) 10 MG tablet Take 1 tablet (10 mg total) by mouth at bedtime as needed for sleep. 90 tablet 1   No current facility-administered medications on file prior to visit.     Past Medical History  Diagnosis Date  . Arthritis   . headaches   . GERD (gastroesophageal reflux disease)   . Hayfever   . Heart disease     s/p MI and angioplasty  . High cholesterol   . Sciatica   . Smoker   . RLS (restless legs syndrome)   . Insomnia   . Anxiety   . Depression     intolerant of paxil, wellbutrin, zoloft  . Baker's cyst   . MI (myocardial infarction)   . Heart murmur      Past Surgical History  Procedure Laterality Date  . Appendectomy  1978  . Cesarean section    . Vaginal hysterectomy    . Angioplasty  1997  . Carpal tunnel release    . Cardiac catheterization      [place]  . Coronary angioplasty      [facility] [place]      Family History  Problem Relation Age of Onset  . Heart disease Mother   .  Diabetes Mother   . Hypertension Mother   . Heart attack Mother   . Alcohol abuse Father   . Heart disease Father   . Heart attack Father   . Breast cancer Maternal Grandmother   . Colon cancer Neg Hx      History   Social History  . Marital Status: Widowed    Spouse Name: N/A  . Number of Children: N/A  . Years of Education: N/A   Occupational History  . Not on file.   Social History Main Topics  . Smoking status: Former Smoker -- 0.33 packs/day for 20 years    Types: Cigarettes    Quit date: 02/13/2015  . Smokeless tobacco: Never Used  . Alcohol Use: 0.0 oz/week    0 Standard drinks or equivalent per week     Comment: rare  . Drug Use: No  . Sexual Activity: Not on file   Other Topics Concern  . Not on file   Social History Narrative   From [place], to  [place] 2012   Widow as of 2001   1 son prev incarcerated, out as of 2014   1 daughter     ROS A 10 point review of system was performed. It is negative other than that mentioned in the history of present illness.   PHYSICAL EXAM   BP 142/78 mmHg  Pulse 70  Ht 5' (1.524 m)  Wt 204 lb 12 oz (92.874 kg)  BMI 39.99 kg/m2 Constitutional: She is oriented to person, place, and time. She appears well-developed and well-nourished. No distress.  HENT: No nasal discharge.  Head: Normocephalic and atraumatic.  Eyes: Pupils are equal and round. No discharge.  Neck: Normal range of motion. Neck supple. No JVD present. No thyromegaly present.  Cardiovascular: Normal rate, regular rhythm, normal heart sounds. Exam reveals no gallop and no friction rub. No murmur heard.  Pulmonary/Chest: Effort normal and breath sounds normal. No stridor. No respiratory distress. She has no wheezes. She has no rales. She exhibits no tenderness.  Abdominal: Soft. Bowel sounds are normal. She exhibits no distension. There is no tenderness. There is no rebound and no guarding.  Musculoskeletal: Normal range of motion. She exhibits no edema and no tenderness.  Neurological: She is alert and oriented to person, place, and time. Coordination normal.  Skin: Skin is warm and dry. No rash noted. She is not diaphoretic. No erythema. No pallor.  Psychiatric: She has a normal mood and affect. Her behavior is normal. Judgment and thought content normal.     EKG: Sinus  Rhythm  Low voltage in precordial leads.   ABNORMAL    ASSESSMENT AND PLAN

## 2015-04-17 NOTE — Assessment & Plan Note (Signed)
The patient has known history of coronary artery disease with previous balloon angioplasty many years ago. Current symptoms are worrisome for angina but she does have some atypical symptoms as well. I requested a pharmacologic nuclear stress test for evaluation. She is not able to exercise on a treadmill due to bilateral knee arthritis. I advised her not to overexert herself until further instructions. Continue current antianginal medications.

## 2015-04-17 NOTE — Assessment & Plan Note (Signed)
Lab Results  Component Value Date   CHOL 190 04/15/2015   HDL 54.60 04/15/2015   LDLCALC 102* 04/15/2015   TRIG 166.0* 04/15/2015   CHOLHDL 3 04/15/2015   Continue treatment with simvastatin. We should consider switching to atorvastatin or rosuvastatin in order to achieve a target LDL of less than 70.

## 2015-04-17 NOTE — Patient Instructions (Addendum)
Medication Instructions:  Please continue current medications  Labwork: None  Testing/Procedures: [place]  Your caregiver has ordered a Stress Test with nuclear imaging. The purpose of this test is to evaluate the blood supply to your heart muscle. This procedure is referred to as a "Non-Invasive Stress Test." This is because other than having an IV started in your vein, nothing is inserted or "invades" your body. Cardiac stress tests are done to find areas of poor blood flow to the heart by determining the extent of coronary artery disease (CAD). Some patients exercise on a treadmill, which naturally increases the blood flow to your heart, while others who are  unable to walk on a treadmill due to physical limitations have a pharmacologic/chemical stress agent called Lexiscan . This medicine will mimic walking on a treadmill by temporarily increasing your coronary blood flow.   Please note: these test may take anywhere between 2-4 hours to complete  PLEASE REPORT TO [facility] AT THE FIRST DESK WILL DIRECT YOU WHERE TO GO  Date of Procedure:_______Wednesday, June 1____  Arrival Time for Procedure:_____7:15 am________  Instructions regarding medication:    __X__:  Hold betablocker(s) night before procedure and morning of procedure:  ATENOLOL  PLEASE NOTIFY THE OFFICE AT LEAST 24 HOURS IN ADVANCE IF YOU ARE UNABLE TO KEEP YOUR APPOINTMENT.  [phone] AND  PLEASE NOTIFY NUCLEAR MEDICINE AT [facility] AT LEAST 24 HOURS IN ADVANCE IF YOU ARE UNABLE TO KEEP YOUR APPOINTMENT. [phone]  How to prepare for your Myoview test:   Do not eat or drink after midnight  No caffeine for 24 hours prior to test  No smoking 24 hours prior to test.  Your medication may be taken with water.  If your doctor stopped a medication because of this test, do not take that medication.  Ladies, please do not wear dresses.  Skirts or pants are appropriate. Please wear a  short sleeve shirt.  No perfume, cologne or lotion.  Wear comfortable walking shoes. No heels!  Follow-Up: 1 month w/ Dr. Fletcher Anon  Any Other Special Instructions Will Be Listed Below (If Applicable).   Nuclear Medicine Exam A nuclear medicine exam is a safe and painless imaging test. It helps to detect and diagnose disease in the body as well as provide information about organ function and structure.  Nuclear scans are most often done of the:  Lungs.  Heart.  Thyroid gland.  Bones.  Abdomen. HOW A NUCLEAR MEDICINE EXAM WORKS A nuclear medicine exam works by using a radioactive tracer. The material is given either by an IV (intravenous) injection or it may be swallowed. After the tracer is in the body, it is absorbed by your body's organs. A large scanning machine that uses a special camera detects the radioactivity in your body. A computerized image is then formed regarding the area of concern. The small amounts of radioactive material used in a nuclear medicine exam are found to be medically safe. However, because radioactive material is used, this test is not done if you are pregnant or nursing.  BEFORE THE PROCEDURE  If available, bring previous imaging studies such as x-rays, etc. with you to the exam.  Arrive early for your exam. PROCEDURE  An IV may be started before the exam begins.  Depending on the type of examination, will lie on a table or sit in a chair during the exam.  The nuclear medicine exam will take about 30 to 60 minutes to complete. AFTER  THE PROCEDURE  After your scan is completed, the image(s) will be evaluated by a specialist. It is important that you follow up with your caregiver to find out your test results.  You may return to your regular activity as instructed by your caregiver. SEEK IMMEDIATE MEDICAL CARE IF: You have shortness of breath or difficulty breathing. MAKE SURE YOU:   Understand these instructions.  Will watch your  condition.  Will get help right away if you are not doing well or get worse. Document Released: 12/15/2004 Document Revised: 01/30/2012 Document Reviewed: 01/29/2009 [facility] Patient Information 2015 [place], [place]. This information is not intended to replace advice given to you by your health care provider. Make sure you discuss any questions you have with your health care provider.

## 2015-04-22 ENCOUNTER — Ambulatory Visit
Admission: RE | Admit: 2015-04-22 | Discharge: 2015-04-22 | Disposition: A | Discharge status: Home / Self Care | Payer: Commercial Managed Care - HMO | Source: Ambulatory Visit | Attending: Cardiovascular Disease | Admitting: Cardiovascular Disease

## 2015-04-22 DIAGNOSIS — R079 Chest pain, unspecified: Secondary | ICD-10-CM | POA: Diagnosis not present

## 2015-04-22 HISTORY — PX: CORONARY ANGIOPLASTY WITH STENT PLACEMENT: SHX49

## 2015-04-22 LAB — NM MYOCAR MULTI W/SPECT W/WALL MOTION / EF
CHL CUP NUCLEAR SSS: 23
CHL CUP RESTING HR STRESS: 68 {beats}/min
CHL CUP STRESS STAGE 2 SPEED: 0 mph
CHL CUP STRESS STAGE 3 GRADE: 0 %
CHL CUP STRESS STAGE 3 HR: 62 {beats}/min
CHL CUP STRESS STAGE 3 SPEED: 0 mph
CHL CUP STRESS STAGE 4 HR: 105 {beats}/min
CHL CUP STRESS STAGE 5 SBP: 153 mmHg
CHL CUP STRESS STAGE 6 HR: 95 {beats}/min
CSEPEW: 1 METS
CSEPPMHR: 64 %
LV dias vol: 78 mL
LV sys vol: 43 mL
Peak HR: 105 {beats}/min
SDS: 14
SRS: 10
Stage 1 HR: 71 {beats}/min
Stage 2 DBP: 78 mmHg
Stage 2 Grade: 0 %
Stage 2 HR: 62 {beats}/min
Stage 2 SBP: 134 mmHg
Stage 4 Grade: 0 %
Stage 4 Speed: 0 mph
Stage 5 DBP: 85 mmHg
Stage 5 Grade: 0 %
Stage 5 HR: 106 {beats}/min
Stage 5 Speed: 0 mph
Stage 6 DBP: 85 mmHg
Stage 6 Grade: 0 %
Stage 6 SBP: 153 mmHg
Stage 6 Speed: 0 mph
TID: 1.15

## 2015-04-22 MED ORDER — TECHNETIUM TC 99M SESTAMIBI - CARDIOLITE
12.8600 | Freq: Once | INTRAVENOUS | Status: AC | PRN
Start: 2015-04-22 — End: 2015-04-22
  Administered 2015-04-22: 08:00:00 12.86 via INTRAVENOUS

## 2015-04-22 MED ORDER — TECHNETIUM TC 99M SESTAMIBI - CARDIOLITE
30.0000 | Freq: Once | INTRAVENOUS | Status: AC | PRN
Start: 2015-04-22 — End: 2015-04-22
  Administered 2015-04-22: 32.31 via INTRAVENOUS

## 2015-04-22 MED ORDER — REGADENOSON 0.4 MG/5ML IV SOLN
0.4000 mg | Freq: Once | INTRAVENOUS | Status: AC
  Administered 2015-04-22: 0.4 mg via INTRAVENOUS

## 2015-04-23 ENCOUNTER — Other Ambulatory Visit: Payer: Self-pay

## 2015-04-23 ENCOUNTER — Telehealth: Payer: Self-pay

## 2015-04-23 ENCOUNTER — Other Ambulatory Visit: Payer: Self-pay | Admitting: Cardiovascular Disease

## 2015-04-23 DIAGNOSIS — R079 Chest pain, unspecified: Secondary | ICD-10-CM

## 2015-04-23 DIAGNOSIS — I209 Angina pectoris, unspecified: Secondary | ICD-10-CM

## 2015-04-23 NOTE — Telephone Encounter (Addendum)
S/w pt regarding cardiac cath time and info Cardiac cath 6/9 at 8:30am, [facility] Pt indicates she will come to office for lab and cxr orders Pt verbalized understanding with no further questions.  Left packet with instructions and lab orders at front desk for patient.

## 2015-04-24 ENCOUNTER — Ambulatory Visit: Payer: Commercial Managed Care - HMO

## 2015-04-27 ENCOUNTER — Ambulatory Visit
Admission: RE | Admit: 2015-04-27 | Discharge: 2015-04-27 | Disposition: A | Discharge status: Home / Self Care | Payer: Commercial Managed Care - HMO | Source: Ambulatory Visit | Attending: Cardiovascular Disease | Admitting: Cardiovascular Disease

## 2015-04-27 DIAGNOSIS — E785 Hyperlipidemia, unspecified: Secondary | ICD-10-CM | POA: Insufficient documentation

## 2015-04-27 DIAGNOSIS — I2510 Atherosclerotic heart disease of native coronary artery without angina pectoris: Secondary | ICD-10-CM | POA: Insufficient documentation

## 2015-04-27 DIAGNOSIS — R079 Chest pain, unspecified: Secondary | ICD-10-CM | POA: Insufficient documentation

## 2015-04-27 DIAGNOSIS — M5430 Sciatica, unspecified side: Secondary | ICD-10-CM | POA: Insufficient documentation

## 2015-04-27 DIAGNOSIS — Z01818 Encounter for other preprocedural examination: Secondary | ICD-10-CM | POA: Insufficient documentation

## 2015-04-27 DIAGNOSIS — F17200 Nicotine dependence, unspecified, uncomplicated: Secondary | ICD-10-CM | POA: Diagnosis not present

## 2015-04-27 DIAGNOSIS — G4700 Insomnia, unspecified: Secondary | ICD-10-CM | POA: Insufficient documentation

## 2015-04-27 DIAGNOSIS — R001 Bradycardia, unspecified: Secondary | ICD-10-CM | POA: Insufficient documentation

## 2015-04-27 LAB — BASIC METABOLIC PANEL
ANION GAP: 8 (ref 5–15)
BUN: 14 mg/dL (ref 6–20)
CALCIUM: 9.2 mg/dL (ref 8.9–10.3)
CHLORIDE: 104 mmol/L (ref 101–111)
CO2: 25 mmol/L (ref 22–32)
CREATININE: 0.71 mg/dL (ref 0.44–1.00)
GFR calc non Af Amer: >60 mL/min (ref >60)
GLUCOSE: 114 mg/dL — AB (ref 65–99)
Potassium: 4.1 mmol/L (ref 3.5–5.1)
SODIUM: 137 mmol/L (ref 135–145)

## 2015-04-27 LAB — PROTIME-INR
INR: 0.95
Prothrombin Time: 12.9 seconds (ref 11.4–15.0)

## 2015-04-27 LAB — CBC
HCT: 36.1 % (ref 35.0–47.0)
Hemoglobin: 12.5 g/dL (ref 12.0–16.0)
MCH: 31.4 pg (ref 26.0–34.0)
MCHC: 34.7 g/dL (ref 32.0–36.0)
MCV: 90.5 fL (ref 80.0–100.0)
PLATELETS: 224 10*3/uL (ref 150–440)
RBC: 3.98 MIL/uL (ref 3.80–5.20)
RDW: 12.3 % (ref 11.5–14.5)
WBC: 5.3 10*3/uL (ref 3.6–11.0)

## 2015-04-30 ENCOUNTER — Encounter
Admission: AD | Disposition: A | Discharge status: Home / Self Care | Payer: Self-pay | Source: Ambulatory Visit | Attending: Cardiovascular Disease

## 2015-04-30 ENCOUNTER — Encounter: Payer: Self-pay | Admitting: *Deleted

## 2015-04-30 ENCOUNTER — Observation Stay
Admission: AD | Admit: 2015-04-30 | Discharge: 2015-05-01 | Disposition: A | Discharge status: Home / Self Care | Payer: Commercial Managed Care - HMO | Source: Ambulatory Visit | Attending: Cardiovascular Disease | Admitting: Cardiovascular Disease

## 2015-04-30 DIAGNOSIS — R9439 Abnormal result of other cardiovascular function study: Secondary | ICD-10-CM | POA: Insufficient documentation

## 2015-04-30 DIAGNOSIS — I10 Essential (primary) hypertension: Secondary | ICD-10-CM | POA: Insufficient documentation

## 2015-04-30 DIAGNOSIS — Z9861 Coronary angioplasty status: Secondary | ICD-10-CM | POA: Insufficient documentation

## 2015-04-30 DIAGNOSIS — Z90710 Acquired absence of both cervix and uterus: Secondary | ICD-10-CM | POA: Diagnosis not present

## 2015-04-30 DIAGNOSIS — Z833 Family history of diabetes mellitus: Secondary | ICD-10-CM | POA: Insufficient documentation

## 2015-04-30 DIAGNOSIS — Z87891 Personal history of nicotine dependence: Secondary | ICD-10-CM | POA: Insufficient documentation

## 2015-04-30 DIAGNOSIS — Z803 Family history of malignant neoplasm of breast: Secondary | ICD-10-CM | POA: Diagnosis not present

## 2015-04-30 DIAGNOSIS — F419 Anxiety disorder, unspecified: Secondary | ICD-10-CM | POA: Diagnosis not present

## 2015-04-30 DIAGNOSIS — Z888 Allergy status to other drugs, medicaments and biological substances status: Secondary | ICD-10-CM | POA: Diagnosis not present

## 2015-04-30 DIAGNOSIS — Z79899 Other long term (current) drug therapy: Secondary | ICD-10-CM | POA: Diagnosis not present

## 2015-04-30 DIAGNOSIS — Z7982 Long term (current) use of aspirin: Secondary | ICD-10-CM | POA: Diagnosis not present

## 2015-04-30 DIAGNOSIS — I200 Unstable angina: Secondary | ICD-10-CM

## 2015-04-30 DIAGNOSIS — Z800 Family history of malignant neoplasm of digestive organs: Secondary | ICD-10-CM | POA: Diagnosis not present

## 2015-04-30 DIAGNOSIS — Z7951 Long term (current) use of inhaled steroids: Secondary | ICD-10-CM | POA: Diagnosis not present

## 2015-04-30 DIAGNOSIS — K219 Gastro-esophageal reflux disease without esophagitis: Secondary | ICD-10-CM | POA: Diagnosis not present

## 2015-04-30 DIAGNOSIS — I2582 Chronic total occlusion of coronary artery: Secondary | ICD-10-CM | POA: Insufficient documentation

## 2015-04-30 DIAGNOSIS — I25110 Atherosclerotic heart disease of native coronary artery with unstable angina pectoris: Secondary | ICD-10-CM | POA: Diagnosis not present

## 2015-04-30 DIAGNOSIS — G2581 Restless legs syndrome: Secondary | ICD-10-CM | POA: Diagnosis not present

## 2015-04-30 DIAGNOSIS — R011 Cardiac murmur, unspecified: Secondary | ICD-10-CM | POA: Diagnosis not present

## 2015-04-30 DIAGNOSIS — Z811 Family history of alcohol abuse and dependence: Secondary | ICD-10-CM | POA: Insufficient documentation

## 2015-04-30 DIAGNOSIS — E780 Pure hypercholesterolemia: Secondary | ICD-10-CM | POA: Diagnosis not present

## 2015-04-30 DIAGNOSIS — I2510 Atherosclerotic heart disease of native coronary artery without angina pectoris: Secondary | ICD-10-CM | POA: Diagnosis present

## 2015-04-30 DIAGNOSIS — F329 Major depressive disorder, single episode, unspecified: Secondary | ICD-10-CM | POA: Insufficient documentation

## 2015-04-30 DIAGNOSIS — I209 Angina pectoris, unspecified: Secondary | ICD-10-CM

## 2015-04-30 DIAGNOSIS — I25118 Atherosclerotic heart disease of native coronary artery with other forms of angina pectoris: Secondary | ICD-10-CM | POA: Diagnosis not present

## 2015-04-30 DIAGNOSIS — E785 Hyperlipidemia, unspecified: Secondary | ICD-10-CM | POA: Diagnosis not present

## 2015-04-30 DIAGNOSIS — Z8249 Family history of ischemic heart disease and other diseases of the circulatory system: Secondary | ICD-10-CM | POA: Insufficient documentation

## 2015-04-30 DIAGNOSIS — I252 Old myocardial infarction: Secondary | ICD-10-CM | POA: Insufficient documentation

## 2015-04-30 DIAGNOSIS — I208 Other forms of angina pectoris: Secondary | ICD-10-CM | POA: Insufficient documentation

## 2015-04-30 DIAGNOSIS — I2089 Other forms of angina pectoris: Secondary | ICD-10-CM | POA: Insufficient documentation

## 2015-04-30 HISTORY — PX: CARDIAC CATHETERIZATION: SHX172

## 2015-04-30 HISTORY — DX: Atherosclerotic heart disease of native coronary artery without angina pectoris: I25.10

## 2015-04-30 SURGERY — LEFT HEART CATH
Anesthesia: Moderate Sedation

## 2015-04-30 MED ORDER — BIVALIRUDIN BOLUS VIA INFUSION - CUPID
INTRAVENOUS | Status: DC | PRN
  Administered 2015-04-30: 68.025 mg via INTRAVENOUS

## 2015-04-30 MED ORDER — ASPIRIN 81 MG PO CHEW: 81.0000 mg | CHEWABLE_TABLET | ORAL | Status: DC

## 2015-04-30 MED ORDER — SODIUM CHLORIDE 0.9 % IJ SOLN: 3.0000 mL | Freq: Two times a day (BID) | INTRAMUSCULAR | Status: DC

## 2015-04-30 MED ORDER — HEPARIN (PORCINE) IN NACL 2-0.9 UNIT/ML-% IJ SOLN
INTRAMUSCULAR | Status: AC
  Filled 2015-04-30: qty 1000

## 2015-04-30 MED ORDER — NITROGLYCERIN 5 MG/ML IV SOLN
INTRAVENOUS | Status: AC
  Filled 2015-04-30: qty 10

## 2015-04-30 MED ORDER — SODIUM CHLORIDE 0.9 % IJ SOLN: 3.0000 mL | INTRAMUSCULAR | Status: DC | PRN

## 2015-04-30 MED ORDER — SODIUM CHLORIDE 0.9 % IJ SOLN
INTRAMUSCULAR | Status: DC | PRN
  Administered 2015-04-30: 09:00:00 via INTRA_ARTERIAL

## 2015-04-30 MED ORDER — ASPIRIN 81 MG PO CHEW
CHEWABLE_TABLET | ORAL | Status: DC | PRN
  Administered 2015-04-30: 243 mg via ORAL

## 2015-04-30 MED ORDER — FENTANYL CITRATE (PF) 100 MCG/2ML IJ SOLN
INTRAMUSCULAR | Status: DC | PRN
  Administered 2015-04-30 (×2): 50 ug via INTRAVENOUS

## 2015-04-30 MED ORDER — SODIUM CHLORIDE 0.9 % IV SOLN: 250.0000 mL | INTRAVENOUS | Status: DC | PRN

## 2015-04-30 MED ORDER — HEPARIN SODIUM (PORCINE) 1000 UNIT/ML IJ SOLN
INTRAMUSCULAR | Status: AC
  Filled 2015-04-30: qty 1

## 2015-04-30 MED ORDER — CLOPIDOGREL BISULFATE 75 MG PO TABS
75.0000 mg | ORAL_TABLET | Freq: Every day | ORAL | Status: DC
  Administered 2015-05-01: 75 mg via ORAL
  Filled 2015-04-30: qty 1

## 2015-04-30 MED ORDER — MIDAZOLAM HCL 2 MG/2ML IJ SOLN
INTRAMUSCULAR | Status: AC
  Filled 2015-04-30: qty 2

## 2015-04-30 MED ORDER — BIVALIRUDIN 250 MG IV SOLR
INTRAVENOUS | Status: AC
Start: 2015-04-30 — End: 2015-04-30
  Filled 2015-04-30: qty 250

## 2015-04-30 MED ORDER — CLOPIDOGREL BISULFATE 75 MG PO TABS
ORAL_TABLET | ORAL | Status: DC | PRN
  Administered 2015-04-30: 600 mg via ORAL

## 2015-04-30 MED ORDER — SODIUM CHLORIDE 0.9 % IJ SOLN
3.0000 mL | Freq: Two times a day (BID) | INTRAMUSCULAR | Status: DC
  Administered 2015-04-30: 3 mL via INTRAVENOUS

## 2015-04-30 MED ORDER — HEPARIN SODIUM (PORCINE) 1000 UNIT/ML IJ SOLN
INTRAMUSCULAR | Status: DC | PRN
  Administered 2015-04-30: 5000 [IU] via INTRAVENOUS

## 2015-04-30 MED ORDER — CLOPIDOGREL BISULFATE 75 MG PO TABS
ORAL_TABLET | ORAL | Status: AC
  Filled 2015-04-30: qty 8

## 2015-04-30 MED ORDER — SODIUM CHLORIDE 0.9 % IV SOLN
INTRAVENOUS | Status: AC
  Administered 2015-04-30: 17:00:00 via INTRAVENOUS

## 2015-04-30 MED ORDER — NITROGLYCERIN 1 MG/10 ML FOR IR/CATH LAB
INTRA_ARTERIAL | Status: DC | PRN
  Administered 2015-04-30: 200 ug via INTRACORONARY

## 2015-04-30 MED ORDER — ALPRAZOLAM 0.5 MG PO TABS
0.2500 mg | ORAL_TABLET | Freq: Three times a day (TID) | ORAL | Status: DC | PRN
  Administered 2015-04-30 (×2): 0.25 mg via ORAL
  Filled 2015-04-30 (×2): qty 1

## 2015-04-30 MED ORDER — ZOLPIDEM TARTRATE 5 MG PO TABS
10.0000 mg | ORAL_TABLET | Freq: Every evening | ORAL | Status: DC | PRN
  Administered 2015-04-30: 10 mg via ORAL
  Filled 2015-04-30: qty 2

## 2015-04-30 MED ORDER — ACETAMINOPHEN 325 MG PO TABS: 650.0000 mg | ORAL_TABLET | ORAL | Status: DC | PRN

## 2015-04-30 MED ORDER — ADENOSINE (DIAGNOSTIC) 3 MG/ML IV SOLN
INTRAVENOUS | Status: AC
  Filled 2015-04-30: qty 30

## 2015-04-30 MED ORDER — FENTANYL CITRATE (PF) 100 MCG/2ML IJ SOLN
INTRAMUSCULAR | Status: AC
  Filled 2015-04-30: qty 2

## 2015-04-30 MED ORDER — ASPIRIN 81 MG PO CHEW
CHEWABLE_TABLET | ORAL | Status: AC
  Filled 2015-04-30: qty 3

## 2015-04-30 MED ORDER — ADENOSINE (DIAGNOSTIC) 140MCG/KG/MIN
INTRAVENOUS | Status: DC | PRN
  Administered 2015-04-30: 140 ug/kg/min via INTRAVENOUS

## 2015-04-30 MED ORDER — PANTOPRAZOLE SODIUM 40 MG PO TBEC
40.0000 mg | DELAYED_RELEASE_TABLET | Freq: Every day | ORAL | Status: DC
  Administered 2015-04-30 – 2015-05-01 (×2): 40 mg via ORAL
  Filled 2015-04-30 (×2): qty 1

## 2015-04-30 MED ORDER — ALBUTEROL SULFATE (2.5 MG/3ML) 0.083% IN NEBU: 2.5000 mg | INHALATION_SOLUTION | Freq: Four times a day (QID) | RESPIRATORY_TRACT | Status: DC | PRN

## 2015-04-30 MED ORDER — VERAPAMIL HCL 2.5 MG/ML IV SOLN
INTRAVENOUS | Status: AC
  Filled 2015-04-30: qty 2

## 2015-04-30 MED ORDER — ATORVASTATIN CALCIUM 20 MG PO TABS
80.0000 mg | ORAL_TABLET | Freq: Every day | ORAL | Status: DC
  Administered 2015-04-30: 80 mg via ORAL
  Filled 2015-04-30: qty 4

## 2015-04-30 MED ORDER — SODIUM CHLORIDE 0.9 % IV SOLN: INTRAVENOUS | Status: DC

## 2015-04-30 MED ORDER — METOPROLOL TARTRATE 25 MG PO TABS
25.0000 mg | ORAL_TABLET | Freq: Two times a day (BID) | ORAL | Status: DC
  Administered 2015-04-30 – 2015-05-01 (×2): 25 mg via ORAL
  Filled 2015-04-30 (×2): qty 1

## 2015-04-30 MED ORDER — IOHEXOL 300 MG/ML  SOLN
INTRAMUSCULAR | Status: DC | PRN
  Administered 2015-04-30: 60 mg via INTRA_ARTERIAL
  Administered 2015-04-30: 75 mg via INTRA_ARTERIAL

## 2015-04-30 MED ORDER — ISOSORBIDE MONONITRATE ER 60 MG PO TB24
60.0000 mg | ORAL_TABLET | Freq: Every day | ORAL | Status: DC
  Administered 2015-04-30 – 2015-05-01 (×2): 60 mg via ORAL
  Filled 2015-04-30 (×2): qty 1

## 2015-04-30 MED ORDER — SODIUM CHLORIDE 0.9 % IV SOLN
250.0000 mg | INTRAVENOUS | Status: DC | PRN
  Administered 2015-04-30: 1.75 mg/kg/h via INTRAVENOUS

## 2015-04-30 MED ORDER — ALBUTEROL SULFATE HFA 108 (90 BASE) MCG/ACT IN AERS: 2.0000 | INHALATION_SPRAY | Freq: Four times a day (QID) | RESPIRATORY_TRACT | Status: DC | PRN

## 2015-04-30 MED ORDER — ASPIRIN 81 MG PO CHEW
81.0000 mg | CHEWABLE_TABLET | Freq: Every day | ORAL | Status: DC
  Administered 2015-05-01: 81 mg via ORAL
  Filled 2015-04-30: qty 1

## 2015-04-30 MED ORDER — MIDAZOLAM HCL 2 MG/2ML IJ SOLN
INTRAMUSCULAR | Status: DC | PRN
  Administered 2015-04-30 (×2): 1 mg via INTRAVENOUS

## 2015-04-30 SURGICAL SUPPLY — 13 items
BALLN TREK RX 2.5X12 (BALLOONS) ×3
BALLOON TREK RX 2.5X12 (BALLOONS) ×1 IMPLANT
CATH HEARTRAIL 6F IL3.5 (CATHETERS) ×3 IMPLANT
CATH OPTITORQUE JACKY 4.0 5F (CATHETERS) ×3 IMPLANT
DEVICE INFLAT 30 PLUS (MISCELLANEOUS) ×3 IMPLANT
DEVICE RAD TR BAND REGULAR (VASCULAR PRODUCTS) ×3 IMPLANT
GLIDESHEATH SLEND SS 6F .021 (SHEATH) ×3 IMPLANT
KIT MANI 3VAL PERCEP (MISCELLANEOUS) ×3 IMPLANT
PACK CARDIAC CATH (CUSTOM PROCEDURE TRAY) ×3 IMPLANT
STENT XIENCE ALPINE RX 2.75X15 (Permanent Stent) ×3 IMPLANT
WIRE PRESSURE VERRATA (WIRE) ×3 IMPLANT
WIRE RUNTHROUGH .014X300CM (WIRE) ×3 IMPLANT
WIRE SAFE-T 1.5MM-J .035X260CM (WIRE) ×6 IMPLANT

## 2015-04-30 NOTE — Care Management (Signed)
Does not appear to be in need of  high  antiplatelet / anticoagulant.  Order at present for plavix which is not cost prohibitive

## 2015-04-30 NOTE — H&P (View-Only) (Signed)
Primary care physician: Dr. Damita Dunnings  HPI  This is a 59 year old female who was referred by Dr. Danise Mina for evaluation of chest pain.  She has known history of coronary artery disease. She reports previous myocardial infarction at the age of 59 years old. She reports undergoing catheterization and balloon angioplasty in [place]. No cardiac events since then. She used to get regular stress tests but none in the last few years. She quit smoking 2 months ago. She has strong family history of coronary artery disease.  Over the last few weeks, she has experienced intermittent episodes of substernal chest tightness with activities and occasionally at rest. She noticed this with yard work and also when she was cleaning the church. She reports that the chest tightness is not always exertional and is not reproducible with certain activities. This has been associated with shortness of breath. She had an echocardiogram done in 2014 for a cardiac murmur which showed normal LV systolic function, mild aortic sclerosis without significant stenosis.    Allergies  Allergen Reactions  . Paroxetine Hcl   . Sertraline Hcl   . Wellbutrin [Bupropion Hcl]      Current Outpatient Prescriptions on File Prior to Visit  Medication Sig Dispense Refill  . albuterol (PROVENTIL HFA;VENTOLIN HFA) 108 (90 BASE) MCG/ACT inhaler Inhale 2 puffs into the lungs every 6 (six) hours as needed for wheezing or shortness of breath. 3 Inhaler 1  . ALPRAZolam (XANAX) 1 MG tablet TAKE ONE TABLET BY MOUTH THREE TIMES DAILY AS NEEDED FOR ANXIETY 270 tablet 1  . aspirin 81 MG tablet Take 81 mg by mouth daily.      Marland Kitchen atenolol (TENORMIN) 25 MG tablet TAKE 1 TABLET EVERY DAY 90 tablet 1  . isosorbide dinitrate (ISORDIL) 20 MG tablet TAKE 1 TABLET BY MOUTH DAILY 90 tablet 1  . Multiple Vitamin (MULTIVITAMIN) tablet Take 1 tablet by mouth daily.      Marland Kitchen omeprazole (PRILOSEC) 20 MG capsule TAKE 1 CAPSULE EVERY DAY 90 capsule 1  . senna  (SENOKOT) 8.6 MG tablet Take 1 tablet by mouth as needed.      . simvastatin (ZOCOR) 20 MG tablet TAKE 1 TABLET  DAILY AT 6 PM. 90 tablet 1  . zolpidem (AMBIEN) 10 MG tablet Take 1 tablet (10 mg total) by mouth at bedtime as needed for sleep. 90 tablet 1   No current facility-administered medications on file prior to visit.     Past Medical History  Diagnosis Date  . Arthritis   . headaches   . GERD (gastroesophageal reflux disease)   . Hayfever   . Heart disease     s/p MI and angioplasty  . High cholesterol   . Sciatica   . Smoker   . RLS (restless legs syndrome)   . Insomnia   . Anxiety   . Depression     intolerant of paxil, wellbutrin, zoloft  . Baker's cyst   . MI (myocardial infarction)   . Heart murmur      Past Surgical History  Procedure Laterality Date  . Appendectomy  1978  . Cesarean section    . Vaginal hysterectomy    . Angioplasty  1997  . Carpal tunnel release    . Cardiac catheterization      [place]  . Coronary angioplasty      [place]       Family History  Problem Relation Age of Onset  . Heart disease Mother   .  Diabetes Mother   . Hypertension Mother   . Heart attack Mother   . Alcohol abuse Father   . Heart disease Father   . Heart attack Father   . Breast cancer Maternal Grandmother   . Colon cancer Neg Hx      History   Social History  . Marital Status: Widowed    Spouse Name: N/A  . Number of Children: N/A  . Years of Education: N/A   Occupational History  . Not on file.   Social History Main Topics  . Smoking status: Former Smoker -- 0.33 packs/day for 20 years    Types: Cigarettes    Quit date: 02/13/2015  . Smokeless tobacco: Never Used  . Alcohol Use: 0.0 oz/week    0 Standard drinks or equivalent per week     Comment: rare  . Drug Use: No  . Sexual Activity: Not on file   Other Topics Concern  . Not on file   Social History Narrative   From [place], to  [place] 2012   Widow as of 2001   1 son prev incarcerated, out as of 2014   1 daughter     ROS A 10 point review of system was performed. It is negative other than that mentioned in the history of present illness.   PHYSICAL EXAM   BP 142/78 mmHg  Pulse 70  Ht 5' (1.524 m)  Wt 204 lb 12 oz (92.874 kg)  BMI 39.99 kg/m2 Constitutional: She is oriented to person, place, and time. She appears well-developed and well-nourished. No distress.  HENT: No nasal discharge.  Head: Normocephalic and atraumatic.  Eyes: Pupils are equal and round. No discharge.  Neck: Normal range of motion. Neck supple. No JVD present. No thyromegaly present.  Cardiovascular: Normal rate, regular rhythm, normal heart sounds. Exam reveals no gallop and no friction rub. No murmur heard.  Pulmonary/Chest: Effort normal and breath sounds normal. No stridor. No respiratory distress. She has no wheezes. She has no rales. She exhibits no tenderness.  Abdominal: Soft. Bowel sounds are normal. She exhibits no distension. There is no tenderness. There is no rebound and no guarding.  Musculoskeletal: Normal range of motion. She exhibits no edema and no tenderness.  Neurological: She is alert and oriented to person, place, and time. Coordination normal.  Skin: Skin is warm and dry. No rash noted. She is not diaphoretic. No erythema. No pallor.  Psychiatric: She has a normal mood and affect. Her behavior is normal. Judgment and thought content normal.     EKG: Sinus  Rhythm  Low voltage in precordial leads.   ABNORMAL    ASSESSMENT AND PLAN

## 2015-04-30 NOTE — Interval H&P Note (Signed)
History and Physical Interval Note:  04/30/2015 9:13 AM  Andrea Webster  has presented today for surgery, with the diagnosis of CAD  Chest pain  The various methods of treatment have been discussed with the patient and family. After consideration of risks, benefits and other options for treatment, the patient has consented to  Procedure(s): Left Heart Cath (N/A) as a surgical intervention .  The patient's history has been reviewed, patient examined, no change in status, stable for surgery.  I have reviewed the patient's chart and labs.  Questions were answered to the patient's satisfaction.     Kathlyn Sacramento

## 2015-05-01 ENCOUNTER — Encounter: Payer: Self-pay | Admitting: Nurse Practitioner

## 2015-05-01 DIAGNOSIS — I2582 Chronic total occlusion of coronary artery: Secondary | ICD-10-CM | POA: Diagnosis not present

## 2015-05-01 DIAGNOSIS — E785 Hyperlipidemia, unspecified: Secondary | ICD-10-CM | POA: Diagnosis not present

## 2015-05-01 DIAGNOSIS — F419 Anxiety disorder, unspecified: Secondary | ICD-10-CM | POA: Diagnosis not present

## 2015-05-01 DIAGNOSIS — Z9861 Coronary angioplasty status: Secondary | ICD-10-CM | POA: Diagnosis not present

## 2015-05-01 DIAGNOSIS — I208 Other forms of angina pectoris: Secondary | ICD-10-CM | POA: Diagnosis not present

## 2015-05-01 DIAGNOSIS — I200 Unstable angina: Secondary | ICD-10-CM

## 2015-05-01 DIAGNOSIS — I252 Old myocardial infarction: Secondary | ICD-10-CM | POA: Diagnosis not present

## 2015-05-01 DIAGNOSIS — F329 Major depressive disorder, single episode, unspecified: Secondary | ICD-10-CM | POA: Diagnosis not present

## 2015-05-01 DIAGNOSIS — I25110 Atherosclerotic heart disease of native coronary artery with unstable angina pectoris: Secondary | ICD-10-CM | POA: Diagnosis not present

## 2015-05-01 DIAGNOSIS — E780 Pure hypercholesterolemia: Secondary | ICD-10-CM | POA: Diagnosis not present

## 2015-05-01 DIAGNOSIS — Z87891 Personal history of nicotine dependence: Secondary | ICD-10-CM | POA: Diagnosis not present

## 2015-05-01 LAB — CBC
HEMATOCRIT: 35.7 % (ref 35.0–47.0)
HEMOGLOBIN: 12.3 g/dL (ref 12.0–16.0)
MCH: 31.6 pg (ref 26.0–34.0)
MCHC: 34.4 g/dL (ref 32.0–36.0)
MCV: 91.8 fL (ref 80.0–100.0)
Platelets: 205 10*3/uL (ref 150–440)
RBC: 3.88 MIL/uL (ref 3.80–5.20)
RDW: 12.4 % (ref 11.5–14.5)
WBC: 6 10*3/uL (ref 3.6–11.0)

## 2015-05-01 LAB — BASIC METABOLIC PANEL
Anion gap: 5 (ref 5–15)
BUN: 10 mg/dL (ref 6–20)
CO2: 29 mmol/L (ref 22–32)
Calcium: 9 mg/dL (ref 8.9–10.3)
Chloride: 105 mmol/L (ref 101–111)
Creatinine, Ser: 0.76 mg/dL (ref 0.44–1.00)
GFR calc non Af Amer: >60 mL/min (ref >60)
Glucose, Bld: 113 mg/dL — ABNORMAL HIGH (ref 65–99)
Potassium: 3.8 mmol/L (ref 3.5–5.1)
Sodium: 139 mmol/L (ref 135–145)

## 2015-05-01 MED ORDER — ISOSORBIDE MONONITRATE ER 60 MG PO TB24: 60.0000 mg | ORAL_TABLET | Freq: Every day | ORAL | Status: DC

## 2015-05-01 MED ORDER — METOPROLOL TARTRATE 25 MG PO TABS: 25.0000 mg | ORAL_TABLET | Freq: Two times a day (BID) | ORAL | Status: DC

## 2015-05-01 MED ORDER — PANTOPRAZOLE SODIUM 40 MG PO TBEC: 40.0000 mg | DELAYED_RELEASE_TABLET | Freq: Every day | ORAL | Status: DC

## 2015-05-01 MED ORDER — CLOPIDOGREL BISULFATE 75 MG PO TABS: 75.0000 mg | ORAL_TABLET | Freq: Every day | ORAL | Status: DC

## 2015-05-01 MED ORDER — NITROGLYCERIN 0.4 MG SL SUBL: 0.4000 mg | SUBLINGUAL_TABLET | SUBLINGUAL | Status: DC | PRN

## 2015-05-01 MED ORDER — ATORVASTATIN CALCIUM 80 MG PO TABS: 80.0000 mg | ORAL_TABLET | Freq: Every day | ORAL | Status: DC

## 2015-05-01 NOTE — Discharge Summary (Signed)
Discharge Summary   Patient ID: Andrea Webster,  MRN: [id number], DOB/AGE: 1956/05/14 59 y.o.  Admit date: 04/30/2015 Discharge date: 05/01/2015  Primary Care Provider: Elsie Stain Primary Cardiologist: Jerilynn Mages. Fletcher Anon, MD   Discharge Diagnoses Principal Problem:   Unstable angina  **s/p PCI/DES to the LCS this admission.  Active Problems:   CAD (coronary artery disease)   Hyperlipidemia   Allergies Allergies  Allergen Reactions  . Paroxetine Hcl   . Sertraline Hcl   . Wellbutrin [Bupropion Hcl]     Procedures  Cardiac Catheterization and Percutaneous Coronary Intervention 6.9.2016  Coronary Findings     Dominance: Co-dominant    Left Main  The vessel is angiographically normal.      Left Anterior Descending   . Prox LAD lesion, 60% stenosed. Pressure wire/FFR was performed on the lesion. FFR: 0.83.   . First Diagonal Branch   . Ost 1st Diag to 1st Diag lesion, 60% stenosed. tubular .      Left Circumflex   . Prox Cx lesion, 50% stenosed. discrete .   Marland Kitchen Mid Cx to Dist Cx lesion, 90% stenosed. The lesion is type non-C, tubular . The lesion was not previously treated.   . **PCI: The Mid LCX was successfully stented using a 2.75 x 15 mm Xience Alpine DES**  . There is no residual stenosis post intervention.     . First Obtuse Marginal Branch   The vessel is small in size. There is mild.   . Second Obtuse Marginal Branch   There is mild.   . Third Obtuse Marginal Branch   The vessel is small in size. There is mild.      Right Coronary Artery   . Mid RCA lesion, 100% stenosed.    _____________   History of Present Illness  59 y/o female with a prior history of CAD s/p  MI at the age of 50, with PTCA performed to unknown vessel.  She has a strong family history for CAD and also smoked until March 2016.  She was recently seen in clinic by Dr. Fletcher Anon secondary to complaints of rest and exertional substernal chest tightness.  Stress testing was undertaken and was  abnormal, revealing normal LV function with basal inferolateral, mid-inferolateral, apical inferior, and apical lateral ischemia.  As a result, she was scheduled for diagnostic catheterization.  Hospital Course  Patient presented to the [facility] lab on 04/30/2015 and underwent diagnostic catheterization revealing a chronic total occlusion of the RCA, severe mid LCX disease with a 90% stenosis, and moderate proximal LAD disease.  Pressure wire analysis was performed within the LAD stenosis and her fractional flow reserve was found to be acceptable at 0.83, thus no PCI was performed within the LAD.  The LCX was felt to be the culprit vessel and this was successfully stented using a 2.75 x 15 mm Xience Alpine DES.  Ms. Zellman tolerated the procedure well and post-procedure has been been ambulating without recurrent symptoms or limitations.  We have adjusted her previous home medications by switching her from atenolol to metoprolol, from simvastatin to high potency atorvastatin, and have changed her from insordil to imdur 60 mg daily.  We have also added plavix in the setting of PCI with a plan for DAPT x at least 1 year.  She will be discharged home today in good condition with office follow-up arranged for approximately 2 wks from now.  Discharge Vitals Blood pressure 162/79, pulse 61, temperature 97.5 F (36.4 C), temperature source Oral,  resp. rate 18, height 5' (1.524 m), weight 208 lb 1.6 oz (94.394 kg), SpO2 99 %.  Filed Weights   04/30/15 0821 05/01/15 0437  Weight: 200 lb (90.719 kg) 208 lb 1.6 oz (94.394 kg)    Labs  CBC  Recent Labs  05/01/15 0603  WBC 6.0  HGB 12.3  HCT 35.7  MCV 91.8  PLT 185   Basic Metabolic Panel  Recent Labs  05/01/15 0603  NA 139  K 3.8  CL 105  CO2 29  GLUCOSE 113*  BUN 10  CREATININE 0.76  CALCIUM 9.0   Disposition  Pt is being discharged home today in good condition.  Follow-up Plans & Appointments      Follow-up Information    Follow  up with Elsie Stain, MD.   Specialty:  [facility] Medicine   Contact information:   [place] [address] [phone]       Follow up with Marcille Blanco On 05/18/2015.   Specialties:  Physician Assistant, Interventional Cardiology, Radiology   Why:  2:00 PM - Dr. Tyrell Antonio PA   Contact information:   [place] [address] [phone]       Discharge Medications    Medication List    STOP taking these medications        atenolol 25 MG tablet  Commonly known as:  TENORMIN     isosorbide dinitrate 20 MG tablet  Commonly known as:  ISORDIL     omeprazole 20 MG capsule  Commonly known as:  PRILOSEC     simvastatin 20 MG tablet  Commonly known as:  ZOCOR      TAKE these medications        albuterol 108 (90 BASE) MCG/ACT inhaler  Commonly known as:  PROVENTIL HFA;VENTOLIN HFA  Inhale 2 puffs into the lungs every 6 (six) hours as needed for wheezing or shortness of breath.     ALPRAZolam 1 MG tablet  Commonly known as:  XANAX  TAKE ONE TABLET BY MOUTH THREE TIMES DAILY AS NEEDED FOR ANXIETY     aspirin 81 MG chewable tablet  Chew 81 mg by mouth daily.     atorvastatin 80 MG tablet  Commonly known as:  LIPITOR  Take 1 tablet (80 mg total) by mouth daily at 6 PM.     clopidogrel 75 MG tablet  Commonly known as:  PLAVIX  Take 1 tablet (75 mg total) by mouth daily with breakfast.     isosorbide mononitrate 60 MG 24 hr tablet  Commonly known as:  IMDUR  Take 1 tablet (60 mg total) by mouth daily.     metoprolol tartrate 25 MG tablet  Commonly known as:  LOPRESSOR  Take 1 tablet (25 mg total) by mouth 2 (two) times daily.     multivitamin tablet  Take 1 tablet by mouth daily.     nitroGLYCERIN 0.4 MG SL tablet  Commonly known as:  NITROSTAT  Place 1 tablet (0.4 mg total) under the tongue every 5 (five) minutes as needed for chest pain.     pantoprazole 40 MG tablet  Commonly known as:  PROTONIX  Take 1  tablet (40 mg total) by mouth daily.     senna 8.6 MG tablet  Commonly known as:  SENOKOT  Take 1 tablet by mouth as needed for constipation.     zolpidem 10 MG tablet  Commonly known as:  AMBIEN  Take 1 tablet (10 mg total)  by mouth at bedtime as needed for sleep.       Outstanding Labs/Studies  F/U lipids/lft's in 8 wks (recent statin change).  Duration of Discharge Encounter   Greater than 30 minutes including physician time.  Signed, Murray Hodgkins NP 05/01/2015, 9:07 AM

## 2015-05-01 NOTE — Progress Notes (Signed)
   SUBJECTIVE: No chest pain or shortness of breath. She wants to go home.   Filed Vitals:   04/30/15 1400 04/30/15 1942 05/01/15 0437 05/01/15 0735  BP: 158/67 165/59 147/69 162/79  Pulse: 53 68 63 61  Temp: 98.2 F (36.8 C) 97.7 F (36.5 C) 97.5 F (36.4 C)   TempSrc: Oral Oral Oral   Resp: 17 18 18 18   Height:      Weight:   208 lb 1.6 oz (94.394 kg)   SpO2: 98% 99% 97% 99%    Intake/Output Summary (Last 24 hours) at 05/01/15 0825 Last data filed at 05/01/15 0500  Gross per 24 hour  Intake      0 ml  Output   1050 ml  Net  -1050 ml    LABS: Basic Metabolic Panel:  Recent Labs  05/01/15 0603  NA 139  K 3.8  CL 105  CO2 29  GLUCOSE 113*  BUN 10  CREATININE 0.76  CALCIUM 9.0   Liver Function Tests: No results for input(s): AST, ALT, ALKPHOS, BILITOT, PROT, ALBUMIN in the last 72 hours. No results for input(s): LIPASE, AMYLASE in the last 72 hours. CBC:  Recent Labs  05/01/15 0603  WBC 6.0  HGB 12.3  HCT 35.7  MCV 91.8  PLT 205   Cardiac Enzymes: No results for input(s): CKTOTAL, CKMB, CKMBINDEX, TROPONINI in the last 72 hours. BNP: Invalid input(s): POCBNP D-Dimer: No results for input(s): DDIMER in the last 72 hours. Hemoglobin A1C: No results for input(s): HGBA1C in the last 72 hours. Fasting Lipid Panel: No results for input(s): CHOL, HDL, LDLCALC, TRIG, CHOLHDL, LDLDIRECT in the last 72 hours. Thyroid Function Tests: No results for input(s): TSH, T4TOTAL, T3FREE, THYROIDAB in the last 72 hours.  Invalid input(s): FREET3 Anemia Panel: No results for input(s): VITAMINB12, FOLATE, FERRITIN, TIBC, IRON, RETICCTPCT in the last 72 hours.   PHYSICAL EXAM General: Well developed, well nourished, in no acute distress HEENT:  Normocephalic and atramatic Neck:  No JVD.  Lungs: Clear bilaterally to auscultation and percussion. Heart: HRRR . Normal S1 and S2 without gallops or murmurs.  Abdomen: Bowel sounds are positive, abdomen soft and  non-tender  Msk:  Back normal, normal gait. Normal strength and tone for age. Extremities: No clubbing, cyanosis or edema.   Neuro: Alert and oriented X 3. Psych:  Good affect, responds appropriately Right radial pulse is normal with no hematoma  TELEMETRY: Reviewed telemetry pt in : Normal sinus rhythm with sinus bradycardia  ASSESSMENT AND PLAN:  1. Class III angina with abnormal stress test: Cardiac catheterization showed severe one-vessel coronary artery disease involving the mid left circumflex. Moderate proximal LAD stenosis not significant by FFR (0.83). Occluded small codominant mid RCA with left-to-right collaterals. She underwent successful angioplasty and drug-eluting stent placement to the mid left circumflex. Recommend dual antiplatelets therapy for at least one year. Referred to cardiac rehabilitation. Discharge home today. I discussed with her the importance of healthy lifestyle changes.   2. Essential hypertension: Blood pressure has been elevated. I increased the dose of isosorbide and switch atenolol to metoprolol. If blood pressure remains elevated upon follow-up, an ACE inhibitor can be added.  3. Hyperlipidemia: The patient needs a high potency statin due to significant coronary artery disease. Thus, I switched simvastatin to atorvastatin. Recommend a follow-up fasting lipid profile in 4-6 weeks.     Kathlyn Sacramento, MD, [facility] 05/01/2015 8:25 AM

## 2015-05-01 NOTE — Progress Notes (Signed)
Discharge instructions explained to pt/ verbalized an understanding/ iv and tele removed/ pt. Transported off unit via wheelchair.

## 2015-05-01 NOTE — Discharge Instructions (Signed)

## 2015-05-04 ENCOUNTER — Encounter: Payer: Self-pay | Admitting: Cardiovascular Disease

## 2015-05-11 NOTE — Telephone Encounter (Signed)
This encounter was created in error - please disregard.

## 2015-05-14 ENCOUNTER — Ambulatory Visit (INDEPENDENT_AMBULATORY_CARE_PROVIDER_SITE_OTHER): Payer: Commercial Managed Care - HMO | Admitting: Family Medicine

## 2015-05-14 ENCOUNTER — Encounter: Payer: Self-pay | Admitting: Family Medicine

## 2015-05-14 VITALS — BP 142/78 | HR 94 | Temp 98.6°F | Wt 206.5 lb

## 2015-05-14 DIAGNOSIS — T148 Other injury of unspecified body region: Secondary | ICD-10-CM | POA: Diagnosis not present

## 2015-05-14 DIAGNOSIS — T148XXA Other injury of unspecified body region, initial encounter: Secondary | ICD-10-CM

## 2015-05-14 DIAGNOSIS — H6981 Other specified disorders of Eustachian tube, right ear: Secondary | ICD-10-CM

## 2015-05-14 DIAGNOSIS — I2510 Atherosclerotic heart disease of native coronary artery without angina pectoris: Secondary | ICD-10-CM | POA: Diagnosis not present

## 2015-05-14 LAB — CBC WITH DIFFERENTIAL/PLATELET
BASOS PCT: 0.3 % (ref 0.0–3.0)
Basophils Absolute: 0 10*3/uL (ref 0.0–0.1)
Eosinophils Absolute: 0.1 10*3/uL (ref 0.0–0.7)
Eosinophils Relative: 1.9 % (ref 0.0–5.0)
HEMATOCRIT: 38.6 % (ref 36.0–46.0)
HEMOGLOBIN: 13.2 g/dL (ref 12.0–15.0)
LYMPHS ABS: 1.2 10*3/uL (ref 0.7–4.0)
LYMPHS PCT: 18.5 % (ref 12.0–46.0)
MCHC: 34.2 g/dL (ref 30.0–36.0)
MCV: 91.5 fl (ref 78.0–100.0)
Monocytes Absolute: 0.3 10*3/uL (ref 0.1–1.0)
Monocytes Relative: 5.3 % (ref 3.0–12.0)
NEUTROS ABS: 4.8 10*3/uL (ref 1.4–7.7)
Neutrophils Relative %: 74 % (ref 43.0–77.0)
Platelets: 258 10*3/uL (ref 150.0–400.0)
RBC: 4.21 Mil/uL (ref 3.87–5.11)
RDW: 12.5 % (ref 11.5–15.5)
WBC: 6.5 10*3/uL (ref 4.0–10.5)

## 2015-05-14 MED ORDER — FLUTICASONE PROPIONATE 50 MCG/ACT NA SUSP: 2.0000 | Freq: Every day | NASAL | Status: DC

## 2015-05-14 NOTE — Patient Instructions (Signed)
Use flonase and gently try to pop your ears.   Go to the lab on the way out.  We'll contact you with your lab report. Take care.  Glad to see you.  Physical in the fall of 2016.

## 2015-05-14 NOTE — Progress Notes (Signed)
Pre visit review using our clinic review tool, if applicable. No additional management support is needed unless otherwise documented below in the visit note.  Admitted with CP, abnormal stress test with PTCA and DES placed.  No CP, not SOB.  Has cards f/u pending.  She feels good except for incidental bruising.  No bleeding.  Not lightheaded on standing.  Now on higher dose of statin.  No myalgias.    She is on low carb diet.  She has cardiac rehab pending.    R ear is stopped up. She  doesn't have pain.  Hearing is affected. She feels like she can't pop her R ear "when I'm going up a mountain."    PMH and SH reviewed  ROS: See HPI, otherwise noncontributory.  Meds, vitals, and allergies reviewed.   GEN: nad, alert and oriented HEENT: mucous membranes moist, tm w/o erythema, nasal exam w/o erythema but stuffy, scant clear discharge noted,  OP without cobblestoning NECK: supple w/o LA CV: rrr.   PULM: ctab, no inc wob EXT: no edema SKIN: no acute rash, small amount of bruising on chest wall noted, ~1cm.

## 2015-05-16 DIAGNOSIS — H6980 Other specified disorders of Eustachian tube, unspecified ear: Secondary | ICD-10-CM | POA: Insufficient documentation

## 2015-05-16 NOTE — Assessment & Plan Note (Signed)
Flonase, valsalva, f/u prn.  D/w pt.

## 2015-05-16 NOTE — Assessment & Plan Note (Signed)
S/p PTCA with DES placed. No CP, not SOB. Has cards f/u pending. She feels good except for incidental bruising. No bleeding. Not lightheaded on standing. Now on higher dose of statin. No myalgias.  She is on low carb diet. She has cardiac rehab pending.  Continue current meds.  Rationale of meds d/w pt.  See notes on cbc re: bruising.  If cbc unremarkable, then continue as is. >25 minutes spent in face to face time with patient, >50% spent in counselling or coordination of care.

## 2015-05-18 ENCOUNTER — Encounter: Payer: Self-pay | Admitting: Physician Assistant

## 2015-05-18 ENCOUNTER — Ambulatory Visit (INDEPENDENT_AMBULATORY_CARE_PROVIDER_SITE_OTHER): Payer: Commercial Managed Care - HMO | Admitting: Physician Assistant

## 2015-05-18 VITALS — BP 124/68 | HR 69 | Ht 60.0 in | Wt 205.5 lb

## 2015-05-18 DIAGNOSIS — T148 Other injury of unspecified body region: Secondary | ICD-10-CM | POA: Diagnosis not present

## 2015-05-18 DIAGNOSIS — K219 Gastro-esophageal reflux disease without esophagitis: Secondary | ICD-10-CM

## 2015-05-18 DIAGNOSIS — E785 Hyperlipidemia, unspecified: Secondary | ICD-10-CM

## 2015-05-18 DIAGNOSIS — T148XXA Other injury of unspecified body region, initial encounter: Secondary | ICD-10-CM

## 2015-05-18 DIAGNOSIS — I2510 Atherosclerotic heart disease of native coronary artery without angina pectoris: Secondary | ICD-10-CM

## 2015-05-18 DIAGNOSIS — R490 Dysphonia: Secondary | ICD-10-CM | POA: Insufficient documentation

## 2015-05-18 DIAGNOSIS — H6981 Other specified disorders of Eustachian tube, right ear: Secondary | ICD-10-CM

## 2015-05-18 DIAGNOSIS — S7012XA Contusion of left thigh, initial encounter: Secondary | ICD-10-CM | POA: Insufficient documentation

## 2015-05-18 NOTE — Progress Notes (Signed)
Cardiology Hospital Follow Up Note:   Date of Encounter: 05/18/2015  ID: Andrea Webster, DOB 14-Mar-1956, MRN [id number]  PCP: Elsie Stain, MD Primary Cardiologist: Dr. Fletcher Anon, MD  Chief Complaint  Patient presents with  . other    Follow up from [facility] s/p cardiac cath & stent placement. Pt. c/o bruising as well has hoarseness since starting on plavix and right ear "stopped up."      HPI:  59 year old female with a prior history of CAD s/p MI at the age of 89, with PTCA performed to unknown vessel.She has a strong family history for CAD and also smoked until March 2016.She was recently seen in clinic by Dr. Fletcher Anon secondary to complaints of rest and exertional substernal chest tightness. Stress testing was undertaken and was abnormal, revealing normal LV function with basal inferolateral, mid-inferolateral, apical inferior, and apical lateral ischemia.As a result, she was scheduled for diagnostic catheterization.   Patient presented to the [facility] cath lab on 04/30/2015 for outpatient cardiac cath and underwent diagnostic catheterization revealing a chronic total occlusion of the RCA, severe mid LCx disease with a 90% stenosis, and moderate proximal LAD disease.Pressure wire analysis was performed within the LAD stenosis and her fractional flow reserve was found to be acceptable at 0.83, thus no PCI was performed within the LAD.The LCx was felt to be the culprit vessel and this was successfully stented using a 2.75 x 15 mm Xience Alpine DES.[name] tolerated the procedure well and post-procedure ambulated without recurrent symptoms or limitations.Her atenolol was changed to metoprolol, simvastatin to atorvastatin from simvastatin to high potency atorvastatin, and have changed her from insordil to imdur 60 mg daily.Start was also started on Plavix in the setting of PCI with a plan for DAPT x at least 1 year.   In follow up with PCP on 6/25 she was feeling well.    She has felt well  since her discharge. The past week she swam all week without any issues. She has not had any chest pain, SOB, nausea, vomiting, diaphoresis, presyncope, or syncope. She does note a long standing history of her right hear feeling like it is clogged up. This dates back to prior to her cardiac cath and starting any new medications. She did not want to do anything about it until she got her cardiac cath done first. She also notes some mild hoarseness and small less than dime size bruising x 4 bruises since starting Plavix. Bruises are on her breast tissue and buttock area. No melena, BRBPR, or hematuria. PCP checked a CBC on 6/23 that was unremarkable. She notes a longstanding history of allergies and post nasal drip which she take Flonase each morning for and has previously taken oral antihistamines. She also has longstanding reflux and was recently changed to Protonix in the setting of her recent stenting.       Past Medical History  Diagnosis Date  . Arthritis   . headaches   . GERD (gastroesophageal reflux disease)   . Hayfever   . CAD (coronary artery disease)     a. s/p MI & PTCA;  b. Ex MV: basal inflat, mid inflat, apical inf, apical lat ischemia;  c. 04/2015 PCI: LM nl, LAD 60p (FFR 0.83), D1 60ost, LCX 50p, 58m (2.75x15 Xience Alpine DES), OM1/2/3 min irregs, RCA 167m CTO.  . High cholesterol   . Sciatica   . Smoker     a. quit 01/2014.  Marland Kitchen RLS (restless legs syndrome)   .  Insomnia   . Anxiety   . Depression     intolerant of paxil, wellbutrin, zoloft  . Baker's cyst   . Heart murmur   : Past Surgical History  Procedure Laterality Date  . Appendectomy  1978  . Cesarean section    . Vaginal hysterectomy    . Angioplasty  1997  . Carpal tunnel release    . Cardiac catheterization      [place]  . Coronary angioplasty      [place]   . Cardiac catheterization N/A 04/30/2015    Procedure: Left Heart Cath;  Surgeon: Wellington Hampshire, MD;   Location: [place] [facility];  Service: Cardiovascular;  Laterality: N/A;  : Family History  Problem Relation Age of Onset  . Heart disease Mother   . Diabetes Mother   . Hypertension Mother   . Heart attack Mother   . Alcohol abuse Father   . Heart disease Father   . Heart attack Father   . Breast cancer Maternal Grandmother   . Colon cancer Neg Hx   :  reports that she quit smoking about 3 months ago. Her smoking use included Cigarettes. She has a 6.6 pack-year smoking history. She has never used smokeless tobacco. She reports that she drinks alcohol. She reports that she does not use illicit drugs.:   Allergies:  Allergies  Allergen Reactions  . Paroxetine Hcl   . Sertraline Hcl   . Wellbutrin [Bupropion Hcl]      Home Medications:  Current Outpatient Prescriptions  Medication Sig Dispense Refill  . albuterol (PROVENTIL HFA;VENTOLIN HFA) 108 (90 BASE) MCG/ACT inhaler Inhale 2 puffs into the lungs every 6 (six) hours as needed for wheezing or shortness of breath. 3 Inhaler 1  . ALPRAZolam (XANAX) 1 MG tablet TAKE ONE TABLET BY MOUTH THREE TIMES DAILY AS NEEDED FOR ANXIETY 270 tablet 1  . aspirin 81 MG chewable tablet Chew 81 mg by mouth daily.    Marland Kitchen atorvastatin (LIPITOR) 80 MG tablet Take 1 tablet (80 mg total) by mouth daily at 6 PM. 90 tablet 3  . clopidogrel (PLAVIX) 75 MG tablet Take 1 tablet (75 mg total) by mouth daily with breakfast. 90 tablet 3  . fluticasone (FLONASE) 50 MCG/ACT nasal spray Place 2 sprays into both nostrils daily. 16 g 1  . isosorbide mononitrate (IMDUR) 60 MG 24 hr tablet Take 1 tablet (60 mg total) by mouth daily. 90 tablet 3  . metoprolol tartrate (LOPRESSOR) 25 MG tablet Take 1 tablet (25 mg total) by mouth 2 (two) times daily. 120 tablet 3  . Multiple Vitamin (MULTIVITAMIN) tablet Take 1 tablet by mouth daily.      . nitroGLYCERIN (NITROSTAT) 0.4 MG SL tablet Place 1 tablet (0.4 mg total) under the tongue every 5 (five) minutes as needed for  chest pain. 25 tablet 3  . pantoprazole (PROTONIX) 40 MG tablet Take 1 tablet (40 mg total) by mouth daily. 90 tablet 3  . senna (SENOKOT) 8.6 MG tablet Take 1 tablet by mouth as needed for constipation.     Marland Kitchen zolpidem (AMBIEN) 10 MG tablet Take 1 tablet (10 mg total) by mouth at bedtime as needed for sleep. 90 tablet 1   No current facility-administered medications for this visit.     Review of Systems:  Review of Systems  Constitutional: Negative for fever, chills, weight loss, malaise/fatigue and diaphoresis.  HENT: Positive for hearing loss. Negative for congestion, ear discharge,  ear pain, sore throat and tinnitus.        Longstanding muffled hearing along the right ear  Eyes: Negative for blurred vision, pain, discharge and redness.  Respiratory: Negative for cough, hemoptysis, sputum production, shortness of breath and wheezing.   Cardiovascular: Negative for chest pain, palpitations, orthopnea, claudication, leg swelling and PND.  Gastrointestinal: Negative for heartburn, nausea, vomiting, abdominal pain, diarrhea, constipation, blood in stool and melena.  Genitourinary: Negative for hematuria.  Musculoskeletal: Negative for myalgias and falls.  Skin: Negative for itching and rash.  Neurological: Negative for sensory change, speech change, focal weakness, weakness and headaches.  Endo/Heme/Allergies: Bruises/bleeds easily.       4 bruises less than dime size   Psychiatric/Behavioral: Negative for substance abuse. The patient is not nervous/anxious.   All other systems reviewed and are negative.    Physical Exam:  Blood pressure 124/68, pulse 69, height 5' (1.524 m), weight 205 lb 8 oz (93.214 kg). Body mass index is 40.13 kg/(m^2). General: Pleasant, NAD. Psych: Normal affect. Responds to questions with normal affect.  Neuro: Alert and oriented X 3. Moves all extremities spontaneously. HEENT: Normocephalic, atraumatic. EOM intact bilaterally. Sclera anicteric.  Neck:  Trachea midline. Supple without bruits or JVD. Lungs:  Respirations regular and unlabored, CTA bilaterally without wheezing, crackles, or rhonchi.  Heart: RRR, normal s3, s4. No murmurs, rubs, or gallops.  Abdomen: Obese, soft, non-tender, non-distended, BS + x 4.  Extremities: No clubbing, cyanosis or edema. DP/PT/Radials 2+ and equal bilaterally. Skin: 1 x 1 cm discoid bruise x 2 along anterior chest wall, patient reports similar bruise x 2 along buttock    Accessory Clinical Findings:  EKG - NSR, 69 bpm, RBBB, TWI III  Other studies Reviewed: Additional studies/ records that were reviewed today include: [place] hospitalization and PCP office visit.  Recent Labs: 06/30/2014: ALT 22 04/15/2015: TSH 2.84 05/01/2015: BUN 10; Creatinine, Ser 0.76; Potassium 3.8; Sodium 139 05/14/2015: Hemoglobin 13.2; Platelets 258.0    Lipid Panel    Component Value Date/Time   CHOL 190 04/15/2015 1018   TRIG 166.0* 04/15/2015 1018   HDL 54.60 04/15/2015 1018   CHOLHDL 3 04/15/2015 1018   VLDL 33.2 04/15/2015 1018   LDLCALC 102* 04/15/2015 1018     Weights: Wt Readings from Last 3 Encounters:  05/18/15 205 lb 8 oz (93.214 kg)  05/14/15 206 lb 8 oz (93.668 kg)  05/01/15 208 lb 1.6 oz (94.394 kg)     Assessment & Plan:  1. CAD s/p PCI/DES to mid LCx on 04/30/2015: -No anginal symptoms -Continue DAPT for 12 months from the date of her cardiac catheterization (aspirin 81 mg daily and Plavix 75 mg daily) -Continue Lopressor 25 mg bid, Imdur 60 mg daily, Lipitor 80 mg daily, and SL NTG 0.4 mg daily prn  -Start cardiac rehab  2. Bruising: -Minimal bruising along the anterior chest wall and buttock consisting of 1 x 1 cm discoid bruise x 4 -No melena, BRBPR, hematuria, or hematemesis -Continue aspirin and Plavix  -CBC stable on 6/23, trend CBC today   3. Hoarseness: -Longstanding issue with allergies and post nasal drip -She was recently started on Flonase (on 6/23), this has not had enough  time to start working yet -Could add Claritin or Zyrtec and take Flonase at nighttime  -Also the possibility of her reflux given the recent change to Protonix given her stenting on 6/9 -If symptoms persist could consider change to different antiplatelet, though would likely save for last option   4.  HLD: -Lipitor as above  5. Reflux: -Possibly playing a role in #3 -Recently changed to Protonix in the setting of new stent  6. ETD: -Longstanding issue -Flonase -Per PCP   Dispo: -Follow up with Dr. Fletcher Anon, MD in 3 months   Christell Faith, PA-C [facility] [address], [place] [address] [phone] [facility] 05/18/2015, 3:21 PM

## 2015-05-18 NOTE — Patient Instructions (Signed)
Medication Instructions:  Please continue your current medications Please try OTC Zyrtec, Claritin, or Allegra Please take your Flonase at night  Labwork: CBC  Testing/Procedures: None  Follow-Up: With Dr. Fletcher Anon in 3 months

## 2015-05-19 LAB — CBC WITH DIFFERENTIAL/PLATELET
BASOS: 0 %
Basophils Absolute: 0 10*3/uL (ref 0.0–0.2)
EOS (ABSOLUTE): 0.1 10*3/uL (ref 0.0–0.4)
Eos: 2 %
HEMATOCRIT: 36.6 % (ref 34.0–46.6)
HEMOGLOBIN: 12.6 g/dL (ref 11.1–15.9)
Immature Grans (Abs): 0 10*3/uL (ref 0.0–0.1)
Immature Granulocytes: 0 %
Lymphocytes Absolute: 1.5 10*3/uL (ref 0.7–3.1)
Lymphs: 23 %
MCH: 30.7 pg (ref 26.6–33.0)
MCHC: 34.4 g/dL (ref 31.5–35.7)
MCV: 89 fL (ref 79–97)
Monocytes Absolute: 0.4 10*3/uL (ref 0.1–0.9)
Monocytes: 7 %
NEUTROS ABS: 4.4 10*3/uL (ref 1.4–7.0)
Neutrophils: 68 %
Platelets: 257 10*3/uL (ref 150–379)
RBC: 4.1 x10E6/uL (ref 3.77–5.28)
RDW: 12.9 % (ref 12.3–15.4)
WBC: 6.5 10*3/uL (ref 3.4–10.8)

## 2015-05-26 ENCOUNTER — Other Ambulatory Visit: Payer: Self-pay

## 2015-05-26 DIAGNOSIS — Z9861 Coronary angioplasty status: Secondary | ICD-10-CM

## 2015-06-01 ENCOUNTER — Ambulatory Visit: Payer: Commercial Managed Care - HMO | Admitting: Cardiovascular Disease

## 2015-06-08 ENCOUNTER — Other Ambulatory Visit: Payer: Self-pay | Admitting: Family Medicine

## 2015-06-08 NOTE — Telephone Encounter (Signed)
Electronic refill request. Alprazolam Last Filled:    270 tablet 1 RF on 01/06/2015  Zolpidem Last Filled:    90 tablet 1 RF on 01/06/2015  Please advise.

## 2015-06-09 NOTE — Telephone Encounter (Signed)
These aren't due until next month.  I put fill on/after 06/27/15 to allow 10 days for delivery.  Thanks.

## 2015-06-09 NOTE — Telephone Encounter (Signed)
Faxed to [phone]

## 2015-07-21 ENCOUNTER — Telehealth: Payer: Self-pay

## 2015-07-21 NOTE — Telephone Encounter (Signed)
I called and spoke with patient about being due for a Mammogram. Patient wanted information on [facility] so she could schedule. I provided her with their contact information, and patient said she would call and schedule an appointment.

## 2015-08-18 ENCOUNTER — Ambulatory Visit: Payer: Commercial Managed Care - HMO | Admitting: Cardiovascular Disease

## 2015-08-19 ENCOUNTER — Telehealth: Payer: Self-pay

## 2015-08-19 NOTE — Telephone Encounter (Signed)
Patient notified as instructed by telephone and verbalized understanding. Patient stated that she checked at her previous doctor's office and was advised that she did not have a pneumonia vaccine there. Appointment scheduled for flu shot and pneumonia vaccine.

## 2015-08-19 NOTE — Telephone Encounter (Signed)
Pt wants to know if due for pneumonia injection. No pneumonia vaccine on immunization list; 06/30/14 annual exam list PNA 2013 in office notes. Pt first stated she is sure she had pneumonia shot at [facility] then she said she might have gotten before moving to this area but I cannot find documentation of pneumonia shot. Pt wants to know if needs pneumonia shot when gets flu shot. Pt request cb.

## 2015-08-19 NOTE — Telephone Encounter (Signed)
She listed that she had it done.  I can only defer to her on that recollection.  She needs to look back through any records she has.  I don't think it will be hazardous to repeat it (and it is likely worth repeating to make sure she has had it done), but she needs to clarify her records.  If she can't find record of it, then repeat it.

## 2015-08-19 NOTE — Telephone Encounter (Signed)
She listed having had it done at that 06/30/14 OV.  She should only need flu shot at this point.  Thanks.

## 2015-08-19 NOTE — Telephone Encounter (Signed)
Patient notified as instructed by telephone and verbalized understanding. Patient stated that she does not remember getting the pneumonia vaccine. Patient stated that if she had gotten the vaccine it would have been at Dr. Josefine Class office because she know that she did not have it at her previous doctor's office.  Patient wants to know since she does not remember having it should she go ahead and get the vaccine?

## 2015-08-26 ENCOUNTER — Ambulatory Visit (INDEPENDENT_AMBULATORY_CARE_PROVIDER_SITE_OTHER): Payer: Commercial Managed Care - HMO | Admitting: *Deleted

## 2015-08-26 DIAGNOSIS — Z23 Encounter for immunization: Secondary | ICD-10-CM | POA: Diagnosis not present

## 2015-09-29 ENCOUNTER — Ambulatory Visit: Payer: Commercial Managed Care - HMO | Admitting: Cardiovascular Disease

## 2015-11-28 ENCOUNTER — Other Ambulatory Visit: Payer: Self-pay | Admitting: Family Medicine

## 2015-11-28 ENCOUNTER — Other Ambulatory Visit: Payer: Self-pay | Admitting: Nurse Practitioner

## 2015-11-30 NOTE — Telephone Encounter (Signed)
Electronic refill request. Alprazolam Last Filled:    270 tablet 1 06/09/2015  Zolpidem Last Filled:    90 tablet 1 06/09/2015  Last office visit:   05/14/15  Please advise.

## 2015-12-01 ENCOUNTER — Other Ambulatory Visit: Payer: Self-pay | Admitting: Family Medicine

## 2015-12-01 MED ORDER — ALPRAZOLAM 1 MG PO TABS: ORAL_TABLET | ORAL | Status: DC

## 2015-12-01 MED ORDER — ZOLPIDEM TARTRATE 10 MG PO TABS: 10.0000 mg | ORAL_TABLET | Freq: Every evening | ORAL | Status: DC | PRN

## 2015-12-01 NOTE — Telephone Encounter (Signed)
Faxed to [employer]

## 2015-12-01 NOTE — Telephone Encounter (Signed)
Initially listed as a call in.   I changed to printed rxs.  Please fax in.  Thanks.  Due for CPE summer 2017.

## 2015-12-01 NOTE — Telephone Encounter (Signed)
Pt called to ck on refills for zolpidem and alprazolam advised pt sent to [facility] 12/01/15; pt voiced understanding.

## 2015-12-04 ENCOUNTER — Ambulatory Visit: Payer: Commercial Managed Care - HMO | Admitting: Cardiovascular Disease

## 2015-12-15 ENCOUNTER — Ambulatory Visit (INDEPENDENT_AMBULATORY_CARE_PROVIDER_SITE_OTHER): Payer: Commercial Managed Care - HMO | Admitting: Family Medicine

## 2015-12-15 ENCOUNTER — Encounter: Payer: Self-pay | Admitting: Family Medicine

## 2015-12-15 VITALS — BP 180/100 | HR 104 | Temp 98.1°F | Wt 216.0 lb

## 2015-12-15 DIAGNOSIS — S7012XA Contusion of left thigh, initial encounter: Secondary | ICD-10-CM

## 2015-12-15 DIAGNOSIS — I10 Essential (primary) hypertension: Secondary | ICD-10-CM | POA: Diagnosis not present

## 2015-12-15 DIAGNOSIS — R300 Dysuria: Secondary | ICD-10-CM | POA: Diagnosis not present

## 2015-12-15 LAB — POC URINALSYSI DIPSTICK (AUTOMATED)
Bilirubin, UA: NEGATIVE
Glucose, UA: NEGATIVE
Ketones, UA: NEGATIVE
LEUKOCYTES UA: NEGATIVE
Nitrite, UA: NEGATIVE
Protein, UA: NEGATIVE
RBC UA: NEGATIVE
Spec Grav, UA: 1.02
UROBILINOGEN UA: 0.2
pH, UA: 7

## 2015-12-15 NOTE — Progress Notes (Signed)
Pre visit review using our clinic review tool, if applicable. No additional management support is needed unless otherwise documented below in the visit note. 

## 2015-12-15 NOTE — Patient Instructions (Addendum)
Take BP meds when you get home as your blood pressure is too high! Warm compresses to left thigh bruise. Tylenol, elevate leg.  Urine is looking ok today - we have sent culture to ensure no infection. We will call you with results.

## 2015-12-15 NOTE — Assessment & Plan Note (Addendum)
She has not taken bp meds this morning yet - forgot Advised to take immediately upon arriving home today - she agrees. No hypertensive symptoms today.

## 2015-12-15 NOTE — Progress Notes (Signed)
BP 180/100 mmHg  Pulse 104  Temp(Src) 98.1 F (36.7 C) (Oral)  Wt 216 lb (97.977 kg)   CC: UTI?  Subjective:    Patient ID: Andrea Webster, female    DOB: 1956/06/11, 60 y.o.   MRN: [id number]  HPI: Andrea Webster is a 60 y.o. female presenting on 12/15/2015 for Dysuria and Check bruise on leg   1 wk h/o urinary symptoms described as painful urination not dysuria. + urgency, frequency. Few urge accidents. + flank pain (?MSK as has been shampooing carpet). No blood in urine, abd pain, fevers/chills, nausea.   No recent abx use.   Self treated with cystex and cranberry juice. Initially helpful but sxs have recurred.   BP elevated - forgot to take am meds - will take them when she gets home. No current chest pain.   She fell 2d ago in tub, hit left leg and bruise/knot developed. On aspirin and plavix.  Relevant past medical, surgical, family and social history reviewed and updated as indicated. Interim medical history since our last visit reviewed. Allergies and medications reviewed and updated. Current Outpatient Prescriptions on File Prior to Visit  Medication Sig  . albuterol (PROVENTIL HFA;VENTOLIN HFA) 108 (90 BASE) MCG/ACT inhaler Inhale 2 puffs into the lungs every 6 (six) hours as needed for wheezing or shortness of breath.  . ALPRAZolam (XANAX) 1 MG tablet TAKE 1 TABLET THREE TIMES DAILY AS NEEDED FOR ANXIETY  . aspirin 81 MG chewable tablet Chew 81 mg by mouth daily.  Marland Kitchen atorvastatin (LIPITOR) 80 MG tablet Take 1 tablet (80 mg total) by mouth daily at 6 PM.  . clopidogrel (PLAVIX) 75 MG tablet Take 1 tablet (75 mg total) by mouth daily with breakfast.  . fluticasone (FLONASE) 50 MCG/ACT nasal spray USE TWO SPRAY(S) IN EACH NOSTRIL ONCE DAILY  . isosorbide mononitrate (IMDUR) 60 MG 24 hr tablet Take 1 tablet (60 mg total) by mouth daily.  . metoprolol tartrate (LOPRESSOR) 25 MG tablet TAKE 1 TABLET TWICE DAILY  . Multiple Vitamin (MULTIVITAMIN) tablet Take 1 tablet by  mouth daily.    . nitroGLYCERIN (NITROSTAT) 0.4 MG SL tablet Place 1 tablet (0.4 mg total) under the tongue every 5 (five) minutes as needed for chest pain.  . pantoprazole (PROTONIX) 40 MG tablet Take 1 tablet (40 mg total) by mouth daily.  Marland Kitchen senna (SENOKOT) 8.6 MG tablet Take 1 tablet by mouth as needed for constipation.   Marland Kitchen zolpidem (AMBIEN) 10 MG tablet Take 1 tablet (10 mg total) by mouth at bedtime as needed. for sleep   No current facility-administered medications on file prior to visit.    Review of Systems Per HPI unless specifically indicated in ROS section     Objective:    BP 180/100 mmHg  Pulse 104  Temp(Src) 98.1 F (36.7 C) (Oral)  Wt 216 lb (97.977 kg)  Wt Readings from Last 3 Encounters:  12/15/15 216 lb (97.977 kg)  05/18/15 205 lb 8 oz (93.214 kg)  05/14/15 206 lb 8 oz (93.668 kg)    Physical Exam  Constitutional: She appears well-developed and well-nourished. No distress.  Abdominal: Soft. Normal appearance and bowel sounds are normal. She exhibits no distension and no mass. There is no hepatosplenomegaly. There is no tenderness. There is no rigidity, no rebound, no guarding, no CVA tenderness and negative Murphy's sign.  Musculoskeletal: She exhibits no edema.  Skin: Skin is warm and dry. Bruising and ecchymosis noted. No rash noted.  Large L lateral  thigh bruise/ecchymosis present with knot about 5cm diameter without erythema or warmth  Nursing note and vitals reviewed.  Results for orders placed or performed in visit on 12/15/15  POCT Urinalysis Dipstick (Automated)  Result Value Ref Range   Color, UA Yellow    Clarity, UA Hazy    Glucose, UA Negative    Bilirubin, UA Negative    Ketones, UA Negative    Spec Grav, UA 1.020    Blood, UA Negative    pH, UA 7.0    Protein, UA Negative    Urobilinogen, UA 0.2    Nitrite, UA Negative    Leukocytes, UA Negative Negative      Assessment & Plan:   Problem List Items Addressed This Visit    Traumatic  ecchymosis of left thigh    Reassured. Discussed anticipated course of resolution. rec warm compresses and elevation of leg, tylenol for discomfort.      Essential hypertension    She has not taken bp meds this morning yet - forgot Advised to take immediately upon arriving home today - she agrees. No hypertensive symptoms today.      Dysuria - Primary    Story consistent with UTI however UA normal - sent culture to verify. Advised pt tylenol for discomfort, push water and ok to continue cystec/cranberry for now. We will call with UCx results.      Relevant Orders   POCT Urinalysis Dipstick (Automated) (Completed)   Urine culture       Follow up plan: Return if symptoms worsen or fail to improve.

## 2015-12-15 NOTE — Assessment & Plan Note (Signed)
Story consistent with UTI however UA normal - sent culture to verify. Advised pt tylenol for discomfort, push water and ok to continue cystec/cranberry for now. We will call with UCx results.

## 2015-12-15 NOTE — Assessment & Plan Note (Signed)
Reassured. Discussed anticipated course of resolution. rec warm compresses and elevation of leg, tylenol for discomfort.

## 2015-12-18 ENCOUNTER — Other Ambulatory Visit: Payer: Self-pay | Admitting: Family Medicine

## 2015-12-18 LAB — URINE CULTURE

## 2015-12-18 MED ORDER — SULFAMETHOXAZOLE-TRIMETHOPRIM 800-160 MG PO TABS: 1.0000 | ORAL_TABLET | Freq: Two times a day (BID) | ORAL | Status: DC

## 2015-12-25 ENCOUNTER — Encounter: Payer: Self-pay | Admitting: Primary Care

## 2015-12-25 ENCOUNTER — Ambulatory Visit (INDEPENDENT_AMBULATORY_CARE_PROVIDER_SITE_OTHER): Payer: Commercial Managed Care - HMO | Admitting: Primary Care

## 2015-12-25 VITALS — BP 144/70 | HR 98 | Temp 98.7°F | Ht 60.0 in | Wt 215.1 lb

## 2015-12-25 DIAGNOSIS — R05 Cough: Secondary | ICD-10-CM | POA: Diagnosis not present

## 2015-12-25 DIAGNOSIS — R059 Cough, unspecified: Secondary | ICD-10-CM

## 2015-12-25 MED ORDER — HYDROCODONE-HOMATROPINE 5-1.5 MG/5ML PO SYRP: 5.0000 mL | ORAL_SOLUTION | Freq: Every evening | ORAL | Status: DC | PRN

## 2015-12-25 MED ORDER — BENZONATATE 200 MG PO CAPS: 200.0000 mg | ORAL_CAPSULE | Freq: Three times a day (TID) | ORAL | Status: DC | PRN

## 2015-12-25 NOTE — Progress Notes (Signed)
Subjective:    Patient ID: Andrea Webster, female    DOB: 1956-10-02, 60 y.o.   MRN: [id number]  HPI  Andrea Webster is a 60 year old female who presents today with a chief complaint of cough. She also reports nasal congestion, body aches, chills. Her symptoms began 3 days ago. Starting last night her cough became worse. Her cough is non productive. She's been around her sister in law who has the same symptoms. She's taken Zyrtec, Flonase, and Theraflu without any improvement. She has an albuterol inhaler at home that she's not used. Denies fevers.  She was treated for UTI on 12/21/15 with Bactrim course. She has several tablets left and has been compliant.   Review of Systems  Constitutional: Positive for chills. Negative for fever.  HENT: Positive for congestion and postnasal drip.   Respiratory: Positive for cough and shortness of breath. Negative for wheezing.   Cardiovascular: Negative for chest pain.  Genitourinary: Negative for dysuria.  Musculoskeletal: Positive for myalgias.       Past Medical History  Diagnosis Date  . Arthritis   . headaches   . GERD (gastroesophageal reflux disease)   . Hayfever   . CAD (coronary artery disease)     a. s/p MI & PTCA;  b. Ex MV: basal inflat, mid inflat, apical inf, apical lat ischemia;  c. 04/2015 PCI: LM nl, LAD 60p (FFR 0.83), D1 60ost, LCX 50p, 71m (2.75x15 Xience Alpine DES), OM1/2/3 min irregs, RCA 139m CTO.  . High cholesterol   . Sciatica   . Smoker     a. quit 01/2014.  Marland Kitchen RLS (restless legs syndrome)   . Insomnia   . Anxiety   . Depression     intolerant of paxil, wellbutrin, zoloft  . Baker's cyst   . Heart murmur     Social History   Social History  . Marital Status: Widowed    Spouse Name: N/A  . Number of Children: N/A  . Years of Education: N/A   Occupational History  . Not on file.   Social History Main Topics  . Smoking status: Former Smoker -- 0.33 packs/day for 20 years    Types: Cigarettes    Quit date:  02/13/2015  . Smokeless tobacco: Never Used  . Alcohol Use: 0.0 oz/week    0 Standard drinks or equivalent per week     Comment: rare  . Drug Use: No  . Sexual Activity: Not on file   Other Topics Concern  . Not on file   Social History Narrative   From [place], to [place] 2012   Widow as of 2001   1 son prev incarcerated, out as of 2014   1 daughter    Past Surgical History  Procedure Laterality Date  . Appendectomy  1978  . Cesarean section    . Vaginal hysterectomy    . Angioplasty  1997  . Carpal tunnel release    . Cardiac catheterization      [place]  . Coronary angioplasty      [facility]   . Cardiac catheterization N/A 04/30/2015    Procedure: Left Heart Cath;  Surgeon: Wellington Hampshire, MD;  Location: [facility];  Service: Cardiovascular;  Laterality: N/A;    Family History  Problem Relation Age of Onset  . Heart disease Mother   . Diabetes Mother   . Hypertension Mother   . Heart attack Mother   .  Alcohol abuse Father   . Heart disease Father   . Heart attack Father   . Breast cancer Maternal Grandmother   . Colon cancer Neg Hx     Allergies  Allergen Reactions  . Paroxetine Hcl   . Sertraline Hcl   . Wellbutrin [Bupropion Hcl]     Current Outpatient Prescriptions on File Prior to Visit  Medication Sig Dispense Refill  . albuterol (PROVENTIL HFA;VENTOLIN HFA) 108 (90 BASE) MCG/ACT inhaler Inhale 2 puffs into the lungs every 6 (six) hours as needed for wheezing or shortness of breath. 3 Inhaler 1  . ALPRAZolam (XANAX) 1 MG tablet TAKE 1 TABLET THREE TIMES DAILY AS NEEDED FOR ANXIETY 270 tablet 1  . aspirin 81 MG chewable tablet Chew 81 mg by mouth daily.    Marland Kitchen atorvastatin (LIPITOR) 80 MG tablet Take 1 tablet (80 mg total) by mouth daily at 6 PM. 90 tablet 3  . clopidogrel (PLAVIX) 75 MG tablet Take 1 tablet (75 mg total) by mouth daily with breakfast. 90 tablet 3  . fluticasone (FLONASE) 50  MCG/ACT nasal spray USE TWO SPRAY(S) IN EACH NOSTRIL ONCE DAILY 16 g 5  . isosorbide mononitrate (IMDUR) 60 MG 24 hr tablet Take 1 tablet (60 mg total) by mouth daily. 90 tablet 3  . metoprolol tartrate (LOPRESSOR) 25 MG tablet TAKE 1 TABLET TWICE DAILY 180 tablet 0  . Multiple Vitamin (MULTIVITAMIN) tablet Take 1 tablet by mouth daily.      . nitroGLYCERIN (NITROSTAT) 0.4 MG SL tablet Place 1 tablet (0.4 mg total) under the tongue every 5 (five) minutes as needed for chest pain. 25 tablet 3  . pantoprazole (PROTONIX) 40 MG tablet Take 1 tablet (40 mg total) by mouth daily. 90 tablet 3  . senna (SENOKOT) 8.6 MG tablet Take 1 tablet by mouth as needed for constipation.     . sulfamethoxazole-trimethoprim (BACTRIM DS,SEPTRA DS) 800-160 MG tablet Take 1 tablet by mouth 2 (two) times daily. 10 tablet 0  . zolpidem (AMBIEN) 10 MG tablet Take 1 tablet (10 mg total) by mouth at bedtime as needed. for sleep 90 tablet 1   No current facility-administered medications on file prior to visit.    BP 144/70 mmHg  Pulse 98  Temp(Src) 98.7 F (37.1 C) (Oral)  Ht 5' (1.524 m)  Wt 215 lb 1.9 oz (97.578 kg)  BMI 42.01 kg/m2  SpO2 98%    Objective:   Physical Exam  Constitutional: She appears well-nourished.  Dry cough noted during exam  HENT:  Right Ear: Tympanic membrane and ear canal normal.  Left Ear: Tympanic membrane and ear canal normal.  Nose: Right sinus exhibits maxillary sinus tenderness. Right sinus exhibits no frontal sinus tenderness. Left sinus exhibits maxillary sinus tenderness. Left sinus exhibits no frontal sinus tenderness.  Mouth/Throat: Oropharynx is clear and moist.  Eyes: Conjunctivae are normal.  Neck: Neck supple.  Cardiovascular: Normal rate and regular rhythm.   Pulmonary/Chest: Effort normal and breath sounds normal. She has no wheezes. She has no rales.  Lymphadenopathy:    She has no cervical adenopathy.  Skin: Skin is warm and dry.          Assessment & Plan:   URI:  Cough, body aches, chills, fatigue x 3 days. Exam without wheezing or rhonchi. Do noticed dry, bronchospasm cough during exam. Does not appear toxic. Also currently treated with Bactrim course for UTI, so I highly doubt her symptoms are bacterial.  Will treat with supportive measures. RX for  tessalon pearls for day cough and Hycodan HS provided. Start using albuterol inhaler for bronchospasm. Continue flonase and mucinex PRN. Fluids, rest, return precautions provided.

## 2015-12-25 NOTE — Patient Instructions (Signed)
You may take Benzonatate capsules for cough. Take 1 capsule by mouth three times daily as needed for cough.  You may take the Hycodan cough suppressant at bedtime as needed for cough and rest. Caution this medication contains codeine and will make you feel drowsy.  Use your albuterol inhaler. Inhale 2 puffs every 6 hours as needed for wheezing and shortness of breath.  Increase consumption of fluids and rest. Please call us next Tuesday  if no improvement in symptoms.  It was a pleasure meeting you!  Upper Respiratory Infection, Adult Most upper respiratory infections (URIs) are a viral infection of the air passages leading to the lungs. A URI affects the nose, throat, and upper air passages. The most common type of URI is nasopharyngitis and is typically referred to as "the common cold." URIs run their course and usually go away on their own. Most of the time, a URI does not require medical attention, but sometimes a bacterial infection in the upper airways can follow a viral infection. This is called a secondary infection. Sinus and middle ear infections are common types of secondary upper respiratory infections. Bacterial pneumonia can also complicate a URI. A URI can worsen asthma and chronic obstructive pulmonary disease (COPD). Sometimes, these complications can require emergency medical care and may be life threatening.  CAUSES Almost all URIs are caused by viruses. A virus is a type of germ and can spread from one person to another.  RISKS FACTORS You may be at risk for a URI if:   You smoke.   You have chronic heart or lung disease.  You have a weakened defense (immune) system.   You are very young or very old.   You have nasal allergies or asthma.  You work in crowded or poorly ventilated areas.  You work in health care facilities or schools. SIGNS AND SYMPTOMS  Symptoms typically develop 2-3 days after you come in contact with a cold virus. Most viral URIs last 7-10  days. However, viral URIs from the influenza virus (flu virus) can last 14-18 days and are typically more severe. Symptoms may include:   Runny or stuffy (congested) nose.   Sneezing.   Cough.   Sore throat.   Headache.   Fatigue.   Fever.   Loss of appetite.   Pain in your forehead, behind your eyes, and over your cheekbones (sinus pain).  Muscle aches.  DIAGNOSIS  Your health care provider may diagnose a URI by:  Physical exam.  Tests to check that your symptoms are not due to another condition such as:  Strep throat.  Sinusitis.  Pneumonia.  Asthma. TREATMENT  A URI goes away on its own with time. It cannot be cured with medicines, but medicines may be prescribed or recommended to relieve symptoms. Medicines may help:  Reduce your fever.  Reduce your cough.  Relieve nasal congestion. HOME CARE INSTRUCTIONS   Take medicines only as directed by your health care provider.   Gargle warm saltwater or take cough drops to comfort your throat as directed by your health care provider.  Use a warm mist humidifier or inhale steam from a shower to increase air moisture. This may make it easier to breathe.  Drink enough fluid to keep your urine clear or pale yellow.   Eat soups and other clear broths and maintain good nutrition.   Rest as needed.   Return to work when your temperature has returned to normal or as your health care provider advises. You  may need to stay home longer to avoid infecting others. You can also use a face mask and careful hand washing to prevent spread of the virus.  Increase the usage of your inhaler if you have asthma.   Do not use any tobacco products, including cigarettes, chewing tobacco, or electronic cigarettes. If you need help quitting, ask your health care provider. PREVENTION  The best way to protect yourself from getting a cold is to practice good hygiene.   Avoid oral or hand contact with people with cold  symptoms.   Wash your hands often if contact occurs.  There is no clear evidence that vitamin C, vitamin E, echinacea, or exercise reduces the chance of developing a cold. However, it is always recommended to get plenty of rest, exercise, and practice good nutrition.  SEEK MEDICAL CARE IF:   You are getting worse rather than better.   Your symptoms are not controlled by medicine.   You have chills.  You have worsening shortness of breath.  You have brown or red mucus.  You have yellow or brown nasal discharge.  You have pain in your face, especially when you bend forward.  You have a fever.  You have swollen neck glands.  You have pain while swallowing.  You have white areas in the back of your throat. SEEK IMMEDIATE MEDICAL CARE IF:   You have severe or persistent:  Headache.  Ear pain.  Sinus pain.  Chest pain.  You have chronic lung disease and any of the following:  Wheezing.  Prolonged cough.  Coughing up blood.  A change in your usual mucus.  You have a stiff neck.  You have changes in your:  Vision.  Hearing.  Thinking.  Mood. MAKE SURE YOU:   Understand these instructions.  Will watch your condition.  Will get help right away if you are not doing well or get worse.   This information is not intended to replace advice given to you by your health care provider. Make sure you discuss any questions you have with your health care provider.   Document Released: 05/03/2001 Document Revised: 03/24/2015 Document Reviewed: 02/12/2014 Elsevier Interactive Patient Education [employer].

## 2015-12-25 NOTE — Progress Notes (Signed)
Pre visit review using our clinic review tool, if applicable. No additional management support is needed unless otherwise documented below in the visit note. 

## 2016-01-18 ENCOUNTER — Ambulatory Visit: Payer: Commercial Managed Care - HMO | Admitting: Cardiovascular Disease

## 2016-01-18 ENCOUNTER — Other Ambulatory Visit: Payer: Self-pay | Admitting: Family Medicine

## 2016-01-18 NOTE — Telephone Encounter (Signed)
Electronic refill request. Last Filled:    3 Inhaler 1 04/02/2015  Please advise.

## 2016-01-19 NOTE — Telephone Encounter (Signed)
Sent. Thanks.   

## 2016-02-10 ENCOUNTER — Other Ambulatory Visit: Payer: Self-pay | Admitting: Cardiovascular Disease

## 2016-02-10 ENCOUNTER — Other Ambulatory Visit: Payer: Self-pay | Admitting: Family Medicine

## 2016-02-10 ENCOUNTER — Other Ambulatory Visit: Payer: Self-pay | Admitting: Nurse Practitioner

## 2016-02-10 NOTE — Telephone Encounter (Signed)
Received refill request electronically Last refill 01/19/16 54 g/1 refill Last office visit 12/25/15/acute Is it okay to refill medication?

## 2016-02-12 NOTE — Telephone Encounter (Signed)
Patient advised.   Patient says she usually only needs it when she has a URI.

## 2016-02-12 NOTE — Telephone Encounter (Signed)
Sent.  If she is needing this frequently, ie dosed more than 2-3 times per week, then she needs OV to discuss.  Thanks.

## 2016-03-01 ENCOUNTER — Ambulatory Visit: Payer: Commercial Managed Care - HMO | Admitting: Cardiovascular Disease

## 2016-03-13 ENCOUNTER — Emergency Department: Payer: Commercial Managed Care - HMO

## 2016-03-13 ENCOUNTER — Encounter: Payer: Self-pay | Admitting: Emergency Medicine

## 2016-03-13 ENCOUNTER — Emergency Department
Admission: EM | Admit: 2016-03-13 | Discharge: 2016-03-13 | Disposition: A | Discharge status: Home / Self Care | Payer: Commercial Managed Care - HMO | Attending: Emergency Medicine | Admitting: Emergency Medicine

## 2016-03-13 DIAGNOSIS — K219 Gastro-esophageal reflux disease without esophagitis: Secondary | ICD-10-CM | POA: Insufficient documentation

## 2016-03-13 DIAGNOSIS — I10 Essential (primary) hypertension: Secondary | ICD-10-CM | POA: Insufficient documentation

## 2016-03-13 DIAGNOSIS — K2970 Gastritis, unspecified, without bleeding: Secondary | ICD-10-CM | POA: Insufficient documentation

## 2016-03-13 DIAGNOSIS — K8050 Calculus of bile duct without cholangitis or cholecystitis without obstruction: Secondary | ICD-10-CM | POA: Diagnosis not present

## 2016-03-13 DIAGNOSIS — Z7982 Long term (current) use of aspirin: Secondary | ICD-10-CM | POA: Diagnosis not present

## 2016-03-13 DIAGNOSIS — F329 Major depressive disorder, single episode, unspecified: Secondary | ICD-10-CM | POA: Insufficient documentation

## 2016-03-13 DIAGNOSIS — Z87891 Personal history of nicotine dependence: Secondary | ICD-10-CM | POA: Insufficient documentation

## 2016-03-13 DIAGNOSIS — R945 Abnormal results of liver function studies: Secondary | ICD-10-CM

## 2016-03-13 DIAGNOSIS — Z79899 Other long term (current) drug therapy: Secondary | ICD-10-CM | POA: Insufficient documentation

## 2016-03-13 DIAGNOSIS — M1990 Unspecified osteoarthritis, unspecified site: Secondary | ICD-10-CM | POA: Insufficient documentation

## 2016-03-13 DIAGNOSIS — R7989 Other specified abnormal findings of blood chemistry: Secondary | ICD-10-CM

## 2016-03-13 DIAGNOSIS — R079 Chest pain, unspecified: Secondary | ICD-10-CM | POA: Diagnosis not present

## 2016-03-13 DIAGNOSIS — Z90710 Acquired absence of both cervix and uterus: Secondary | ICD-10-CM | POA: Insufficient documentation

## 2016-03-13 DIAGNOSIS — I2510 Atherosclerotic heart disease of native coronary artery without angina pectoris: Secondary | ICD-10-CM | POA: Insufficient documentation

## 2016-03-13 DIAGNOSIS — E785 Hyperlipidemia, unspecified: Secondary | ICD-10-CM | POA: Insufficient documentation

## 2016-03-13 HISTORY — DX: Essential (primary) hypertension: I10

## 2016-03-13 LAB — BASIC METABOLIC PANEL
Anion gap: 8 (ref 5–15)
BUN: 10 mg/dL (ref 6–20)
CHLORIDE: 99 mmol/L — AB (ref 101–111)
CO2: 29 mmol/L (ref 22–32)
CREATININE: 0.71 mg/dL (ref 0.44–1.00)
Calcium: 9.6 mg/dL (ref 8.9–10.3)
GFR calc non Af Amer: >60 mL/min (ref >60)
Glucose, Bld: 118 mg/dL — ABNORMAL HIGH (ref 65–99)
Potassium: 4.2 mmol/L (ref 3.5–5.1)
SODIUM: 136 mmol/L (ref 135–145)

## 2016-03-13 LAB — HEPATIC FUNCTION PANEL
ALBUMIN: 4.5 g/dL (ref 3.5–5.0)
ALT: 96 U/L — ABNORMAL HIGH (ref 14–54)
AST: 91 U/L — AB (ref 15–41)
Alkaline Phosphatase: 131 U/L — ABNORMAL HIGH (ref 38–126)
BILIRUBIN TOTAL: 1.4 mg/dL — AB (ref 0.3–1.2)
Bilirubin, Direct: 0.2 mg/dL (ref 0.1–0.5)
Indirect Bilirubin: 1.2 mg/dL — ABNORMAL HIGH (ref 0.3–0.9)
TOTAL PROTEIN: 7.8 g/dL (ref 6.5–8.1)

## 2016-03-13 LAB — CBC
HCT: 37.2 % (ref 35.0–47.0)
Hemoglobin: 13 g/dL (ref 12.0–16.0)
MCH: 31.5 pg (ref 26.0–34.0)
MCHC: 35 g/dL (ref 32.0–36.0)
MCV: 90.2 fL (ref 80.0–100.0)
PLATELETS: 240 10*3/uL (ref 150–440)
RBC: 4.12 MIL/uL (ref 3.80–5.20)
RDW: 12.1 % (ref 11.5–14.5)
WBC: 9.4 10*3/uL (ref 3.6–11.0)

## 2016-03-13 LAB — LIPASE, BLOOD: LIPASE: 21 U/L (ref 11–51)

## 2016-03-13 LAB — TROPONIN I: Troponin I: <0.03 ng/mL (ref <0.031)

## 2016-03-13 MED ORDER — OXYCODONE HCL 5 MG PO TABS: 5.0000 mg | ORAL_TABLET | Freq: Four times a day (QID) | ORAL | Status: DC | PRN

## 2016-03-13 MED ORDER — SUCRALFATE 1 G PO TABS: 1.0000 g | ORAL_TABLET | Freq: Four times a day (QID) | ORAL | Status: DC

## 2016-03-13 MED ORDER — ONDANSETRON 4 MG PO TBDP: 4.0000 mg | ORAL_TABLET | Freq: Three times a day (TID) | ORAL | Status: DC | PRN

## 2016-03-13 MED ORDER — FAMOTIDINE 20 MG PO TABS: 20.0000 mg | ORAL_TABLET | Freq: Two times a day (BID) | ORAL | Status: DC

## 2016-03-13 MED ORDER — ACETAMINOPHEN 325 MG PO TABS
650.0000 mg | ORAL_TABLET | Freq: Once | ORAL | Status: AC
  Administered 2016-03-13: 650 mg via ORAL

## 2016-03-13 MED ORDER — ACETAMINOPHEN 325 MG PO TABS
ORAL_TABLET | ORAL | Status: AC
  Filled 2016-03-13: qty 2

## 2016-03-13 MED ORDER — GI COCKTAIL ~~LOC~~
30.0000 mL | Freq: Once | ORAL | Status: AC
  Administered 2016-03-13: 30 mL via ORAL

## 2016-03-13 MED ORDER — GI COCKTAIL ~~LOC~~
ORAL | Status: AC
  Filled 2016-03-13: qty 30

## 2016-03-13 NOTE — ED Notes (Addendum)
Chest pain started last night in central chest which patients states goes all the way through to her back. Last night she had right ear pain as well.   Took NTG x1 early this morning and x2 last night which did help her rest more but did not take pain away.   Took 1 baby ASA today.

## 2016-03-13 NOTE — Discharge Instructions (Signed)
You were evaluated for indigestion symptoms, which I suspect are due to acid reflux/gastritis. As we discussed, you may try Tums or liquid Maalox as directed on labeling as needed for symptom relief. Continue your Protonix.  Your exam and evaluation did not indicate cardiac issue, however I would like you to follow up with your cardiologist, call tomorrow to make an appointment in the next 2 days. Additionally should follow-up with her primary care physician. If he did not have relief of your indigestion symptoms within the next week, you may need to be referred to see a gastrologist for possible scope.  You were prescribed a medication that is potentially sedating. Do not drink alcohol, drive or participate in any other potentially dangerous activities while taking this medication as it may make you sleepy. Do not take this medication with any other sedating medications, either prescription or over-the-counter. If you were prescribed Percocet or Vicodin, do not take these with acetaminophen (Tylenol) as it is already contained within these medications.   Opioid pain medications (or "narcotics") can be habit forming.  Use it as little as possible to achieve adequate pain control.  Do not use or use it with extreme caution if you have a history of opiate abuse or dependence.  If you are on a pain contract with your primary care doctor or a pain specialist, be sure to let them know you were prescribed this medication today from the [facility].  This medication is intended for your use only - do not give any to anyone else and keep it in a secure place where nobody else, especially children and pets, have access to it.  It will also cause or worsen constipation, so you may want to consider taking an over-the-counter stool softener while you are taking this medication.    Gastritis, Adult Gastritis is soreness and puffiness (inflammation) of the lining of the stomach. If you do not  get help, gastritis can cause bleeding and sores (ulcers) in the stomach. HOME CARE   Only take medicine as told by your doctor.  If you were given antibiotic medicines, take them as told. Finish the medicines even if you start to feel better.  Drink enough fluids to keep your pee (urine) clear or pale yellow.  Avoid foods and drinks that make your problems worse. Foods you may want to avoid include:  Caffeine or alcohol.  Chocolate.  Mint.  Garlic and onions.  Spicy foods.  Citrus fruits, including oranges, lemons, or limes.  Food containing tomatoes, including sauce, chili, salsa, and pizza.  Fried and fatty foods.  Eat small meals throughout the day instead of large meals. GET HELP RIGHT AWAY IF:   You have black or dark red poop (stools).  You throw up (vomit) blood. It may look like coffee grounds.  You cannot keep fluids down.  Your belly (abdominal) pain gets worse.  You have a fever.  You do not feel better after 1 week.  You have any other questions or concerns. MAKE SURE YOU:   Understand these instructions.  Will watch your condition.  Will get help right away if you are not doing well or get worse.   This information is not intended to replace advice given to you by your health care provider. Make sure you discuss any questions you have with your health care provider.   Document Released: 04/25/2008 Document Revised: 01/30/2012 Document Reviewed: 12/21/2011 Elsevier Interactive Patient Education 2016 [employer] for Gastroesophageal  Reflux Disease, Adult When you have gastroesophageal reflux disease (GERD), the foods you eat and your eating habits are very important. Choosing the right foods can help ease the discomfort of GERD. WHAT GENERAL GUIDELINES DO I NEED TO FOLLOW?  Choose fruits, vegetables, whole grains, low-fat dairy products, and low-fat meat, fish, and poultry.  Limit fats such as oils, salad dressings, butter,  nuts, and avocado.  Keep a food diary to identify foods that cause symptoms.  Avoid foods that cause reflux. These may be different for different people.  Eat frequent small meals instead of three large meals each day.  Eat your meals slowly, in a relaxed setting.  Limit fried foods.  Cook foods using methods other than frying.  Avoid drinking alcohol.  Avoid drinking large amounts of liquids with your meals.  Avoid bending over or lying down until 2-3 hours after eating. WHAT FOODS ARE NOT RECOMMENDED? The following are some foods and drinks that may worsen your symptoms: Vegetables Tomatoes. Tomato juice. Tomato and spaghetti sauce. Chili peppers. Onion and garlic. Horseradish. Fruits Oranges, grapefruit, and lemon (fruit and juice). Meats High-fat meats, fish, and poultry. This includes hot dogs, ribs, ham, sausage, salami, and bacon. Dairy Whole milk and chocolate milk. Sour cream. Cream. Butter. Ice cream. Cream cheese.  Beverages Coffee and tea, with or without caffeine. Carbonated beverages or energy drinks. Condiments Hot sauce. Barbecue sauce.  Sweets/Desserts Chocolate and cocoa. Donuts. Peppermint and spearmint. Fats and Oils High-fat foods, including [place] fries and potato chips. Other Vinegar. Strong spices, such as black pepper, white pepper, red pepper, cayenne, curry powder, cloves, ginger, and chili powder. The items listed above may not be a complete list of foods and beverages to avoid. Contact your dietitian for more information.   This information is not intended to replace advice given to you by your health care provider. Make sure you discuss any questions you have with your health care provider.   Document Released: 11/07/2005 Document Revised: 11/28/2014 Document Reviewed: 09/11/2013 Elsevier Interactive Patient Education [employer].

## 2016-03-13 NOTE — ED Provider Notes (Signed)
Assumed care from Dr. Reita Cliche with ultrasound of the right upper quadrant pending due to epigastric pain and elevated LFTs. She notes that all her pain started yesterday morning after she ate a ate breakfast with peppers and onions and this feels just like her usual GERD but is much worse. Ultrasound does show multiple small gallstones without cholecystitis. In this setting with elevated LFTs, this is consistent with choledocholithiasis. The patient reports that her pain is persistent but mild and manageable. She does not want any further pain medicine at this time. Repeat abdominal exam shows she is completely nontender. Vital signs are stable and unremarkable. I discussed possible admission with the patient but she completely refuses and is eager to go home and will follow-up with Gen. surgery in clinic. I gave her my usual return precautions. Also recommended she follow up with gastroenterology due to the persistence and severity of her GERD-like symptoms.  Carrie Mew, MD 03/13/16 1725

## 2016-03-13 NOTE — ED Provider Notes (Signed)
[facility] Emergency Department Provider Note   ____________________________________________  Time seen: I have reviewed the triage vital signs and the triage nursing note.  HISTORY  Chief Complaint Chest Pain   Historian Patient  HPI Andrea Webster is a 60 y.o. female with a history of prior coronary artery disease who is recently status post stent, also with a history of gastric esophageal reflux disease, who is here with a complaint of epigastric/chest burning with belching and gas that started yesterday morning after eating a [employer] breakfast that consisted of boiled egg, toast and peppers. She started to have epigastric burning up into her mid chest. She tried Tums which did intermittently throughout the day. No nausea, vomiting, diarrhea, black or bloody stools, fever, trouble breathing, or sweating. The pain did at times go through to her back. She's not had pancreatitis before.  She took an extra protonic's, she takes Protonix daily.  Food seems to make it worse.   after 24 hours of symptoms, she thought that she should be evaluated for her heart, because many years ago when she had her first heart attack she had somewhat similar symptoms, except that with a heart attack she had left-sided chest pain, not central lower.   Past Medical History  Diagnosis Date  . Arthritis   . headaches   . GERD (gastroesophageal reflux disease)   . Hayfever   . CAD (coronary artery disease)     a. s/p MI & PTCA;  b. Ex MV: basal inflat, mid inflat, apical inf, apical lat ischemia;  c. 04/2015 PCI: LM nl, LAD 60p (FFR 0.83), D1 60ost, LCX 50p, 65m(2.75x15 Xience Alpine DES), OM1/2/3 min irregs, RCA 1078mTO.  . High cholesterol   . Sciatica   . Smoker     a. quit 01/2014.  . Marland KitchenLS (restless legs syndrome)   . Insomnia   . Anxiety   . Depression     intolerant of paxil, wellbutrin, zoloft  . Baker's cyst   . Heart murmur   . Hypertension     Patient  Active Problem List   Diagnosis Date Noted  . Dysuria 12/15/2015  . Essential hypertension 12/15/2015  . Traumatic ecchymosis of left thigh 05/18/2015  . Hoarseness 05/18/2015  . GERD (gastroesophageal reflux disease) 05/18/2015  . ETD (eustachian tube dysfunction) 05/16/2015  . Unstable angina (H[place]06/08/2015  . Effort angina (HCH[place]06/07/2015  . Angina pectoris (HCWauna05/25/2016  . Advance care planning 07/01/2014  . Benign paroxysmal positional vertigo 07/01/2014  . Undiagnosed cardiac murmurs 04/11/2013  . Medicare annual wellness visit, initial 04/11/2013  . Mollusca contagiosa 05/08/2012  . Knee pain 11/02/2011  . Warts 06/29/2011  . Hyperlipidemia 04/28/2011  . Insomnia 04/28/2011  . Bradycardia 04/28/2011  . CAD (coronary artery disease), native coronary artery 03/29/2011  . Sciatica 03/29/2011  . Panic 03/29/2011  . Smoking 03/29/2011    Past Surgical History  Procedure Laterality Date  . Appendectomy  1978  . Cesarean section    . Vaginal hysterectomy    . Angioplasty  1997  . Carpal tunnel release    . [place]   . Cardiac catheterization N/A 04/30/2015    Procedure: Left Heart Cath;  Surgeon: MuWellington HampshireMD;  [facility];  Service: Cardiovascular;  Laterality: N/A;    Current Outpatient Rx  Name  Route  Sig  Dispense  Refill  . ALPRAZolam (XANAX) 1 MG tablet      TAKE 1 TABLET THREE TIMES DAILY AS NEEDED FOR ANXIETY   270 tablet   1   . aspirin 81 MG chewable tablet   Oral   Chew 81 mg by mouth daily.         Marland Kitchen atorvastatin (LIPITOR) 80 MG tablet      TAKE 1 TABLET EVERY DAY  AT  6  PM   90 tablet   3   . benzonatate (TESSALON) 200 MG capsule   Oral   Take 1 capsule (200 mg total) by mouth 3 (three) times daily as needed.   21 capsule   0   . clopidogrel (PLAVIX) 75 MG tablet      TAKE 1 TABLET  DAILY WITH  BREAKFAST   90 tablet   3   . fluticasone (FLONASE) 50 MCG/ACT nasal spray      USE TWO SPRAY(S) IN EACH NOSTRIL ONCE DAILY   16 g   5   . HYDROcodone-homatropine (HYCODAN) 5-1.5 MG/5ML syrup   Oral   Take 5 mLs by mouth at bedtime as needed.   50 mL   0   . isosorbide mononitrate (IMDUR) 60 MG 24 hr tablet      TAKE 1 TABLET EVERY DAY   90 tablet   3   . metoprolol tartrate (LOPRESSOR) 25 MG tablet      TAKE 1 TABLET TWICE DAILY   180 tablet   3   . Multiple Vitamin (MULTIVITAMIN) tablet   Oral   Take 1 tablet by mouth daily.           . nitroGLYCERIN (NITROSTAT) 0.4 MG SL tablet   Sublingual   Place 1 tablet (0.4 mg total) under the tongue every 5 (five) minutes as needed for chest pain.   25 tablet   3   . pantoprazole (PROTONIX) 40 MG tablet      TAKE 1 TABLET EVERY DAY   90 tablet   3   . senna (SENOKOT) 8.6 MG tablet   Oral   Take 1 tablet by mouth as needed for constipation.          . sulfamethoxazole-trimethoprim (BACTRIM DS,SEPTRA DS) 800-160 MG tablet   Oral   Take 1 tablet by mouth 2 (two) times daily.   10 tablet   0   . VENTOLIN HFA 108 (90 Base) MCG/ACT inhaler      INHALE 2 PUFFS EVERY 6  HOURS AS NEEDED FOR WHEEZING OR SHORTNESS OF BREATH.   54 g   1   . zolpidem (AMBIEN) 10 MG tablet   Oral   Take 1 tablet (10 mg total) by mouth at bedtime as needed. for sleep   90 tablet   1     Allergies Paroxetine hcl; Sertraline hcl; and Wellbutrin  Family History  Problem Relation Age of Onset  . Heart disease Mother   . Diabetes Mother   . Hypertension Mother   . Heart attack Mother   . Alcohol abuse Father   . Heart disease Father   . Heart attack Father   . Breast cancer Maternal Grandmother   . Colon cancer Neg Hx     Social History Social History  Substance Use Topics  . Smoking status: Former Smoker -- 0.33 packs/day for 20 years    Types: Cigarettes    Quit date: 02/13/2015  . Smokeless  tobacco: Never Used  .  Alcohol Use: 0.0 oz/week    0 Standard drinks or equivalent per week     Comment: rare    Review of Systems  Constitutional: Negative for fever. Eyes: Negative for visual changes. ENT: Negative for sore throat. Cardiovascular: Negative for Pleuritic chest pain. Respiratory: Negative for shortness of breath. Gastrointestinal: Negative for vomiting and diarrhea. Genitourinary: Negative for dysuria. Musculoskeletal: Negative for back pain. Skin: Negative for rash. Neurological: Negative for headache. 10 point Review of Systems otherwise negative ____________________________________________   PHYSICAL EXAM:  VITAL SIGNS: ED Triage Vitals  Enc Vitals Group     BP 03/13/16 1127 151/79 mmHg     Pulse Rate 03/13/16 1127 88     Resp 03/13/16 1127 20     Temp 03/13/16 1127 97.7 F (36.5 C)     Temp Source 03/13/16 1127 Oral     SpO2 03/13/16 1127 97 %     Weight 03/13/16 1127 211 lb (95.709 kg)     Height 03/13/16 1127 5' (1.524 m)     Head Cir --      Peak Flow --      Pain Score 03/13/16 1123 8     Pain Loc --      Pain Edu? --      Excl. in [place]? --      Constitutional: Alert and oriented. Well appearing and in no distress. HEENT   Head: Normocephalic and atraumatic.      Eyes: Conjunctivae are normal. PERRL. Normal extraocular movements.      Ears:         Nose: No congestion/rhinnorhea.   Mouth/Throat: Mucous membranes are moist.   Neck: No stridor. Cardiovascular/Chest: Normal rate, regular rhythm.  No murmurs, rubs, or gallops. Respiratory: Normal respiratory effort without tachypnea nor retractions. Breath sounds are clear and equal bilaterally. No wheezes/rales/rhonchi. Gastrointestinal: Soft. No distention, no guarding, no rebound. Very very mild epigastric discomfort, no focal right upper quadrant tenderness or lateral tenderness. Genitourinary/rectal:Deferred Musculoskeletal: Nontender with normal range of motion in all extremities. No joint effusions.   No lower extremity tenderness.  No edema. Neurologic:  Normal speech and language. No gross or focal neurologic deficits are appreciated. Skin:  Skin is warm, dry and intact. No rash noted. Psychiatric: Mood and affect are normal. Speech and behavior are normal. Patient exhibits appropriate insight and judgment.  ____________________________________________   EKG I, Lisa Roca, MD, the attending physician have personally viewed and interpreted all ECGs.  89 bpm. Normal sinus rhythm. Narrow QRS normal axis. Normal ST and T-wave ____________________________________________  LABS (pertinent positives/negatives)  Basic metabolic panel without significant abnormality. White blood count 9.4, afebrile at 13.0 platelet count 240 Troponin less than 0.03 Hepatic function panel significant for AST 91, aVL T 96, alkaline phosphatase 131, bilirubin 1.4 Lipase 21  ____________________________________________  RADIOLOGY All Xrays were viewed by me. Imaging interpreted by Radiologist.  Two-view chest: Normal chest radiograph  Ultrasound right upper quadrant: Pending   __________________________________________  PROCEDURES  Procedure(s) performed: None  Critical Care performed: None  ____________________________________________   ED COURSE / ASSESSMENT AND PLAN  Pertinent labs & imaging results that were available during my care of the patient were reviewed by me and considered in my medical decision making (see chart for details).   Patient's symptoms sound very much clinically consistent with gastritis/GERD. Her EKG is reassuring. Her symptoms have been ongoing about 24 hours now and her troponin is negative.  She did get some relief with  Tums at home, as well as a GI cocktail here.  We discussed a GERD diet, as well as Tums versus liquid Maalox at home.  Although I don't think today's episode is due to cardiac related chest discomfort, and have her follow-up with her  cardiologist.  In terms of acid reflux/indigestion, she can follow with a primary care physician, and we discussed potentially next step would include outpatient gastroenterology consultation.   ----------------------------------------- 3:14 PM on 03/13/2016 -----------------------------------------  LST is mildly elevated, however given pain that is epigastric and goes through to the back, I am going to ultrasound her right upper quadrant.  Patient care transferred to Dr. Joni Fears at shift change, 3:30 PM. Ultrasound pending. If negative/reassuring, patient will be discharged with my discharge instructions.    CONSULTATIONS:   None   Patient / Family / Caregiver informed of clinical course, medical decision-making process, and agree with plan.   I discussed return precautions, follow-up instructions, and discharged instructions with patient and/or family.   ___________________________________________   FINAL CLINICAL IMPRESSION(S) / ED DIAGNOSES   Final diagnoses:  Gastritis  Gastroesophageal reflux disease, esophagitis presence not specified  LFTs abnormal              Note: This dictation was prepared with Dragon dictation. Any transcriptional errors that result from this process are unintentional   Lisa Roca, MD 03/13/16 1515

## 2016-03-17 ENCOUNTER — Encounter: Payer: Self-pay | Admitting: Cardiovascular Disease

## 2016-03-17 ENCOUNTER — Ambulatory Visit (INDEPENDENT_AMBULATORY_CARE_PROVIDER_SITE_OTHER): Payer: Commercial Managed Care - HMO | Admitting: Cardiovascular Disease

## 2016-03-17 ENCOUNTER — Other Ambulatory Visit: Payer: Self-pay

## 2016-03-17 VITALS — BP 134/80 | HR 81 | Ht 60.0 in | Wt 221.5 lb

## 2016-03-17 DIAGNOSIS — R079 Chest pain, unspecified: Secondary | ICD-10-CM

## 2016-03-17 NOTE — Patient Instructions (Signed)
Medication Instructions: Continue same medications.   Labwork: None.   Procedures/Testing: None.   Follow-Up: 6 months with Dr. Fletcher Anon  Any Additional Special Instructions Will Be Listed Below (If Applicable).  Stop Plavix 1 week before surgery once scheduled.    If you need a refill on your cardiac medications before your next appointment, please call your pharmacy.

## 2016-03-17 NOTE — Progress Notes (Signed)
Cardiology Office Note   Date:  03/17/2016   ID:  Andrea Webster, DOB 1956/10/07, MRN [id number]  PCP:  Elsie Stain, MD  Cardiologist:   Kathlyn Sacramento, MD   Chief Complaint  Patient presents with  . other    C/o chest pain went to ED due to Gallstones and ulcers. Meds reviewd verbally with pt.      History of Present Illness: Andrea Webster is a 60 y.o. female who presents for  A follow-up visit regarding coronary artery disease. She has known history of CAD s/p MI at the age of 51, with PTCA performed to unknown vessel.She has a strong family history for CAD and also smoked until March 2016.She was seen by me last year for increased angina.   cardiac catheterization in June 2016 showed a chronic total occlusion of the RCA, severe mid LCx disease with a 90% stenosis, and moderate proximal LAD disease.Pressure wire analysis was performed within the LAD stenosis and her fractional flow reserve was found to be not significant at 0.83, thus no PCI was performed within the LAD.The LCx was felt to be the culprit vessel and this was successfully stented using a 2.75 x 15 mm Xience Alpine DES. She has been doing very well with no recurrent angina since then. She went to the emergency room recently with lower substernal chest pain radiating to her back described as back discomfort. Cardiac workup with enzymes were negative. Her LFTs were abnormal and thus she underwent an abdominal ultrasound which showed gallstones. She denies exertional symptoms.  Past Medical History  Diagnosis Date  . Arthritis   . headaches   . GERD (gastroesophageal reflux disease)   . Hayfever   . CAD (coronary artery disease)     a. s/p MI & PTCA;  b. Ex MV: basal inflat, mid inflat, apical inf, apical lat ischemia;  c. 04/2015 PCI: LM nl, LAD 60p (FFR 0.83), D1 60ost, LCX 50p, 56m (2.75x15 Xience Alpine DES), OM1/2/3 min irregs, RCA 174m CTO.  . High cholesterol   . Sciatica   . Smoker     a. quit  01/2014.  Marland Kitchen RLS (restless legs syndrome)   . Insomnia   . Anxiety   . Depression     intolerant of paxil, wellbutrin, zoloft  . Baker's cyst   . Heart murmur   . Hypertension     Past Surgical History  Procedure Laterality Date  . Appendectomy  1978  . Cesarean section    . Vaginal hysterectomy    . Angioplasty  1997  . Carpal tunnel release    . Cardiac catheterization      [place]  . Coronary angioplasty      [place]   . Cardiac catheterization N/A 04/30/2015    Procedure: Left Heart Cath;  Surgeon: Wellington Hampshire, MD;  Location: [facility];  Service: Cardiovascular;  Laterality: N/A;     Current Outpatient Prescriptions  Medication Sig Dispense Refill  . ALPRAZolam (XANAX) 1 MG tablet TAKE 1 TABLET THREE TIMES DAILY AS NEEDED FOR ANXIETY 270 tablet 1  . aspirin 81 MG chewable tablet Chew 81 mg by mouth daily.    Marland Kitchen atorvastatin (LIPITOR) 80 MG tablet TAKE 1 TABLET EVERY DAY  AT  6  PM 90 tablet 3  . clopidogrel (PLAVIX) 75 MG tablet TAKE 1 TABLET  DAILY WITH BREAKFAST 90 tablet 3  . famotidine (PEPCID) 20 MG tablet Take  1 tablet (20 mg total) by mouth 2 (two) times daily. 60 tablet 0  . fluticasone (FLONASE) 50 MCG/ACT nasal spray USE TWO SPRAY(S) IN EACH NOSTRIL ONCE DAILY 16 g 5  . HYDROcodone-homatropine (HYCODAN) 5-1.5 MG/5ML syrup Take 5 mLs by mouth at bedtime as needed. 50 mL 0  . isosorbide mononitrate (IMDUR) 60 MG 24 hr tablet TAKE 1 TABLET EVERY DAY 90 tablet 3  . metoprolol tartrate (LOPRESSOR) 25 MG tablet TAKE 1 TABLET TWICE DAILY 180 tablet 3  . Multiple Vitamin (MULTIVITAMIN) tablet Take 1 tablet by mouth daily.      . nitroGLYCERIN (NITROSTAT) 0.4 MG SL tablet Place 1 tablet (0.4 mg total) under the tongue every 5 (five) minutes as needed for chest pain. 25 tablet 3  . ondansetron (ZOFRAN ODT) 4 MG disintegrating tablet Take 1 tablet (4 mg total) by mouth every 8 (eight) hours as needed for nausea or  vomiting. 20 tablet 0  . oxyCODONE (ROXICODONE) 5 MG immediate release tablet Take 1 tablet (5 mg total) by mouth every 6 (six) hours as needed for breakthrough pain. 12 tablet 0  . pantoprazole (PROTONIX) 40 MG tablet TAKE 1 TABLET EVERY DAY 90 tablet 3  . senna (SENOKOT) 8.6 MG tablet Take 1 tablet by mouth as needed for constipation.     . sucralfate (CARAFATE) 1 g tablet Take 1 tablet (1 g total) by mouth 4 (four) times daily. 120 tablet 1  . VENTOLIN HFA 108 (90 Base) MCG/ACT inhaler INHALE 2 PUFFS EVERY 6  HOURS AS NEEDED FOR WHEEZING OR SHORTNESS OF BREATH. 54 g 1  . zolpidem (AMBIEN) 10 MG tablet Take 1 tablet (10 mg total) by mouth at bedtime as needed. for sleep 90 tablet 1   No current facility-administered medications for this visit.    Allergies:   Paroxetine hcl; Sertraline hcl; and Wellbutrin    Social History:  The patient  reports that she quit smoking about 13 months ago. Her smoking use included Cigarettes. She has a 6.6 pack-year smoking history. She has never used smokeless tobacco. She reports that she drinks alcohol. She reports that she does not use illicit drugs.   Family History:  The patient's family history includes Alcohol abuse in her father; Breast cancer in her maternal grandmother; Diabetes in her mother; Heart attack in her father and mother; Heart disease in her father and mother; Hypertension in her mother. There is no history of Colon cancer.    ROS:  Please see the history of present illness.   Otherwise, review of systems are positive for none.   All other systems are reviewed and negative.    PHYSICAL EXAM: VS:  BP 134/80 mmHg  Pulse 81  Ht 5' (1.524 m)  Wt 221 lb 8 oz (100.472 kg)  BMI 43.26 kg/m2 , BMI Body mass index is 43.26 kg/(m^2). GEN: Well nourished, well developed, in no acute distress HEENT: normal Neck: no JVD, carotid bruits, or masses Cardiac: RRR; no  rubs, or gallops,no edema . 2/6 systolic ejection murmur in the aortic area which  is early peaking Respiratory:  clear to auscultation bilaterally, normal work of breathing GI: soft, nontender, nondistended, + BS MS: no deformity or atrophy Skin: warm and dry, no rash Neuro:  Strength and sensation are intact Psych: euthymic mood, full affect   EKG:  EKG is not ordered today.    Recent Labs: 04/15/2015: TSH 2.84 03/13/2016: ALT 96*; BUN 10; Creatinine, Ser 0.71; Hemoglobin 13.0; Platelets 240; Potassium 4.2; Sodium  136    Lipid Panel    Component Value Date/Time   CHOL 190 04/15/2015 1018   TRIG 166.0* 04/15/2015 1018   HDL 54.60 04/15/2015 1018   CHOLHDL 3 04/15/2015 1018   VLDL 33.2 04/15/2015 1018   LDLCALC 102* 04/15/2015 1018      Wt Readings from Last 3 Encounters:  03/17/16 221 lb 8 oz (100.472 kg)  03/13/16 211 lb (95.709 kg)  12/25/15 215 lb 1.9 oz (97.578 kg)      Other studies Reviewed: Additional studies/ records that were reviewed today include: Recent records from the emergency room. Review of the above records demonstrates: Troponin was negative. EKG was normal.   ASSESSMENT AND PLAN:  1.  Coronary artery disease involving native coronary arteries without angina: She is overall doing well with no anginal symptoms. Continue medical therapy. If she needs to have cholecystectomy done, Plavix can be held 7 days before the surgery. I am planning to stop Plavix anyway in June but it can be held before then if needed for surgery.  2. Hyperlipidemia: Continue high dose atorvastatin. She will need a follow-up lipid profile. Recent liver enzymes were mildly elevated which was attributed to gallstones. Another possibility could be high dose atorvastatin and thus a follow-up liver profile is recommended within the next month or 2.  3. Preoperative cardiovascular evaluation: The patient might need cholecystectomy done. She has no anginal symptoms and has been stable since her PCI in June 2016. Her EKG is normal. Thus, she does not require ischemic  cardiac evaluation before surgery.  4. Aortic sclerosis murmur: I would consider repeat echocardiogram the next year.  Disposition:   FU with me in 6 months  Signed,  Kathlyn Sacramento, MD  03/17/2016 2:03 PM    [place]

## 2016-03-18 ENCOUNTER — Ambulatory Visit (INDEPENDENT_AMBULATORY_CARE_PROVIDER_SITE_OTHER): Payer: Commercial Managed Care - HMO | Admitting: General Surgery

## 2016-03-18 ENCOUNTER — Encounter: Payer: Self-pay | Admitting: General Surgery

## 2016-03-18 VITALS — BP 147/86 | HR 78 | Temp 98.7°F | Ht 61.0 in | Wt 222.0 lb

## 2016-03-18 DIAGNOSIS — K8050 Calculus of bile duct without cholangitis or cholecystitis without obstruction: Secondary | ICD-10-CM

## 2016-03-18 NOTE — Patient Instructions (Addendum)
You have requested to have your gallbladder removed. We will arrange for this to be done on Mar 30, 2016 at [facility] with Dr. Dahlia Byes.  You will most likely be out of work 1-2 weeks for this surgery. You will return after your post-op appointment with a lifting restriction for approximately 4 more weeks.  You will be able to eat anything you would like to following surgery. But, start by eating a bland diet and advance this as tolerated.  Please see the (blue)pre-care form that you have been given today. If you have any questions, please call our office.

## 2016-03-18 NOTE — Progress Notes (Signed)
Patient ID: Andrea Webster, female   DOB: 1956-09-26, 60 y.o.   MRN: [id number]  CC: ABDOMINAL PAIN  HPI Andrea Webster is a 60 y.o. female presents to clinic today for follow-up from a recent ER visit for right upper quadrant abdominal pain. Patient states that after having a fatty spicy meal over the weekend she had epigastric pain that radiated to her right upper quadrant through to her right back. She says it feels like a sharp fire poker was diving into her right abdomen. The pain lasted for approximately 4 hours but started within 30 minutes of eating a meal. She's had a history of indigestion before and may be some mild pain in the right upper quadrant but never this bad. She was evaluated in the emergency department and found to have gallstones with mildly elevated liver functions including a mildly elevated bilirubin at that time. She does have a significant heart history and was afraid with the midepigastric pain that it could represent a heart attack however cardiac workup was negative. She is also already been seen by cardiology who gave permission to stop her aspirin and Plavix prior to surgical dimension. Since being seen in the emergency department she has not had any fevers, chills, nausea, vomiting, diarrhea, constipation, chest pain, shortness of breath.  HPI  Past Medical History  Diagnosis Date  . Arthritis   . headaches   . GERD (gastroesophageal reflux disease)   . Hayfever   . CAD (coronary artery disease)     a. s/p MI & PTCA;  b. Ex MV: basal inflat, mid inflat, apical inf, apical lat ischemia;  c. 04/2015 PCI: LM nl, LAD 60p (FFR 0.83), D1 60ost, LCX 50p, 61m (2.75x15 Xience Alpine DES), OM1/2/3 min irregs, RCA 179m CTO.  . High cholesterol   . Sciatica   . Smoker     a. quit 01/2014.  Marland Kitchen RLS (restless legs syndrome)   . Insomnia   . Anxiety   . Depression     intolerant of paxil, wellbutrin, zoloft  . Baker's cyst   . Heart murmur   . Hypertension     Past  Surgical History  Procedure Laterality Date  . Appendectomy  1978  . Cesarean section    . Vaginal hysterectomy    . Angioplasty  1997  . Carpal tunnel release    . Cardiac catheterization      [place]  . Coronary angioplasty      [facility] [place]   . Cardiac catheterization N/A 04/30/2015    Procedure: Left Heart Cath;  Surgeon: Wellington Hampshire, MD;  Location: [facility];  Service: Cardiovascular;  Laterality: N/A;    Family History  Problem Relation Age of Onset  . Heart disease Mother   . Diabetes Mother   . Hypertension Mother   . Heart attack Mother   . Alcohol abuse Father   . Heart disease Father   . Heart attack Father   . Breast cancer Maternal Grandmother   . Colon cancer Neg Hx     Social History Social History  Substance Use Topics  . Smoking status: Former Smoker -- 0.33 packs/day for 20 years    Types: Cigarettes    Quit date: 02/13/2015  . Smokeless tobacco: Never Used  . Alcohol Use: 0.0 oz/week    0 Standard drinks or equivalent per week     Comment: rare    Allergies  Allergen Reactions  .  Paroxetine Hcl   . Sertraline Hcl   . Wellbutrin [Bupropion Hcl]     Current Outpatient Prescriptions  Medication Sig Dispense Refill  . ALPRAZolam (XANAX) 1 MG tablet TAKE 1 TABLET THREE TIMES DAILY AS NEEDED FOR ANXIETY 270 tablet 1  . aspirin 81 MG chewable tablet Chew 81 mg by mouth daily.    Marland Kitchen atorvastatin (LIPITOR) 80 MG tablet TAKE 1 TABLET EVERY DAY  AT  6  PM 90 tablet 3  . clopidogrel (PLAVIX) 75 MG tablet TAKE 1 TABLET  DAILY WITH BREAKFAST 90 tablet 3  . famotidine (PEPCID) 20 MG tablet Take 1 tablet (20 mg total) by mouth 2 (two) times daily. 60 tablet 0  . fluticasone (FLONASE) 50 MCG/ACT nasal spray USE TWO SPRAY(S) IN EACH NOSTRIL ONCE DAILY 16 g 5  . HYDROcodone-homatropine (HYCODAN) 5-1.5 MG/5ML syrup Take 5 mLs by mouth at bedtime as needed. 50 mL 0  . isosorbide mononitrate (IMDUR) 60 MG 24 hr  tablet TAKE 1 TABLET EVERY DAY 90 tablet 3  . metoprolol tartrate (LOPRESSOR) 25 MG tablet TAKE 1 TABLET TWICE DAILY 180 tablet 3  . Multiple Vitamin (MULTIVITAMIN) tablet Take 1 tablet by mouth daily.      . nitroGLYCERIN (NITROSTAT) 0.4 MG SL tablet Place 1 tablet (0.4 mg total) under the tongue every 5 (five) minutes as needed for chest pain. 25 tablet 3  . ondansetron (ZOFRAN ODT) 4 MG disintegrating tablet Take 1 tablet (4 mg total) by mouth every 8 (eight) hours as needed for nausea or vomiting. 20 tablet 0  . oxyCODONE (ROXICODONE) 5 MG immediate release tablet Take 1 tablet (5 mg total) by mouth every 6 (six) hours as needed for breakthrough pain. 12 tablet 0  . pantoprazole (PROTONIX) 40 MG tablet TAKE 1 TABLET EVERY DAY 90 tablet 3  . senna (SENOKOT) 8.6 MG tablet Take 1 tablet by mouth as needed for constipation.     . sucralfate (CARAFATE) 1 g tablet Take 1 tablet (1 g total) by mouth 4 (four) times daily. 120 tablet 1  . VENTOLIN HFA 108 (90 Base) MCG/ACT inhaler INHALE 2 PUFFS EVERY 6  HOURS AS NEEDED FOR WHEEZING OR SHORTNESS OF BREATH. 54 g 1  . zolpidem (AMBIEN) 10 MG tablet Take 1 tablet (10 mg total) by mouth at bedtime as needed. for sleep 90 tablet 1   No current facility-administered medications for this visit.     Review of Systems A Multi-point review of systems was asked and was negative except for the findings documented in the history of present illness  Physical Exam Blood pressure 147/86, pulse 78, temperature 98.7 F (37.1 C), temperature source Oral, height 5\' 1"  (1.549 m), weight 100.699 kg (222 lb). CONSTITUTIONAL: No acute distress. EYES: Pupils are equal, round, and reactive to light, Sclera are non-icteric. EARS, NOSE, MOUTH AND THROAT: The oropharynx is clear. The oral mucosa is pink and moist. Hearing is intact to voice. LYMPH NODES:  Lymph nodes in the neck are normal. RESPIRATORY:  Lungs are clear. There is normal respiratory effort, with equal breath  sounds bilaterally, and without pathologic use of accessory muscles. CARDIOVASCULAR: Heart is regular without murmurs, gallops, or rubs. GI: The abdomen is large, soft, nontender, and nondistended. There are no palpable masses. There is no hepatosplenomegaly. There are normal bowel sounds in all quadrants. GU: Rectal deferred.   MUSCULOSKELETAL: Normal muscle strength and tone. No cyanosis or edema.   SKIN: Turgor is good and there are no pathologic skin lesions  or ulcers. NEUROLOGIC: Motor and sensation is grossly normal. Cranial nerves are grossly intact. PSYCH:  Oriented to person, place and time. Affect is normal.  Data Reviewed Images and labs reviewed. Labs are significant for a mild hyperbilirubinemia of 1.4, mild elevation of ALT and AST at 96 and 91, mild elevation of alkaline phosphatase at 131. Her white blood cell count was normal at 9.4. The ultrasound showed a possible hepatic steatosis and multiple gallstones without evidence of cholecystitis I have personally reviewed the patient's imaging, laboratory findings and medical records.    Assessment    Biliary colic    Plan    60 year old female with biliary colic and mild elevation of liver function tests. Discussed with the patient that the elevations in LFTs could be related to her heart history and cholesterol medications however it is also possible is from passing a stone. Given this was discussed the importance of doing a contrast study of the time of surgery to evaluate her ducts. I discussed the procedure of a laparoscopic cholecystectomy in detail.  The patient was given Neurosurgeon.  We discussed the risks and benefits of a laparoscopic cholecystectomy and possible cholangiogram including, but not limited to bleeding, infection, injury to surrounding structures such as the intestine or liver, bile leak, retained gallstones, need to convert to an open procedure, prolonged diarrhea, blood clots such as  DVT, common  bile duct injury, anesthesia risks, and possible need for additional procedures.  The likelihood of improvement in symptoms and return to the patient's normal status is good. We discussed the typical post-operative recovery course. Patient desires to proceed as soon as possible. Given my current schedule the earliest operative time in a safe window for her off of aspirin and Plavix his or time available for Dr. Dahlia Byes. Patient voiced understanding and agrees to have surgery with him. Plan for surgery on May 10 after being off of aspirin and Plavix for at least 7 days.      Time spent with the patient was 60 minutes, with more than 50% of the time spent in face-to-face education, counseling and care coordination.     Andrea Pert, MD FACS General Surgeon 03/18/2016, 11:33 AM

## 2016-03-21 ENCOUNTER — Telehealth: Payer: Self-pay | Admitting: Surgery

## 2016-03-21 NOTE — Telephone Encounter (Signed)
Pt advised of pre op date/time and sx date. Sx: 03/30/16 with Dr Pabon--Laparoscopic cholecystectomy  With IOC. Pre op: 03/24/16 @ 9:00am--Office.   Patient made aware to call [phone], between 1-3:00pm the day before surgery, to find out what time to arrive.

## 2016-03-24 ENCOUNTER — Encounter
Admission: RE | Admit: 2016-03-24 | Discharge: 2016-03-24 | Disposition: A | Discharge status: Home / Self Care | Payer: Commercial Managed Care - HMO | Source: Ambulatory Visit | Attending: Surgery | Admitting: Surgery

## 2016-03-24 DIAGNOSIS — Z95820 Peripheral vascular angioplasty status with implants and grafts: Secondary | ICD-10-CM | POA: Insufficient documentation

## 2016-03-24 DIAGNOSIS — I10 Essential (primary) hypertension: Secondary | ICD-10-CM | POA: Insufficient documentation

## 2016-03-24 DIAGNOSIS — Z01810 Encounter for preprocedural cardiovascular examination: Secondary | ICD-10-CM | POA: Diagnosis not present

## 2016-03-24 HISTORY — DX: Acute myocardial infarction, unspecified: I21.9

## 2016-03-24 NOTE — Patient Instructions (Signed)
Your procedure is scheduled on: WED 03/30/16 Report to Day Surgery. 2ND FLOOR MEDICAL MALL ENTRANCE To find out your arrival time please call [phone] between 1PM - 3PM on TUE 03/29/16.  Remember: Instructions that are not followed completely may result in serious medical risk, up to and including death, or upon the discretion of your surgeon and anesthesiologist your surgery may need to be rescheduled.    __X__ 1. Do not eat food or drink liquids after midnight. No gum chewing or hard candies.     __X__ 2. No Alcohol for 24 hours before or after surgery.   ____ 3. Bring all medications with you on the day of surgery if instructed.    __X__ 4. Notify your doctor if there is any change in your medical condition     (cold, fever, infections).     Do not wear jewelry, make-up, hairpins, clips or nail polish.  Do not wear lotions, powders, or perfumes.   Do not shave 48 hours prior to surgery. Men may shave face and neck.  Do not bring valuables to the hospital.    [facility] is not responsible for any belongings or valuables.               Contacts, dentures or bridgework may not be worn into surgery.  Leave your suitcase in the car. After surgery it may be brought to your room.  For patients admitted to the hospital, discharge time is determined by your                treatment team.   Patients discharged the day of surgery will not be allowed to drive home.   Please read over the following fact sheets that you were given:   Surgical Site Infection Prevention   __X__ Take these medicines the morning of surgery with A SIP OF WATER:    1. ISOSORBIDE  2. METOPROLOL  3. PANTOPRAZOLE  4. ALPRAZOLAM IF NEEDED FOR ANXIETY  5. OXYCODONE IF NEEDED FOR PAIN  6.  ____ Fleet Enema (as directed)   __X__ Use CHG Soap as directed  __X__ Use inhalers on the day of surgery AND BRING DAY OF SURGERY  ____ Stop metformin 2 days prior to surgery    ____ Take 1/2 of usual insulin dose the  night before surgery and none on the morning of surgery.   __X__ Stop Coumadin/Plavix/aspirin on 7DAYS PRIOR TO SURGERY  ____ Stop Anti-inflammatories on    ____ Stop supplements until after surgery.    ____ Bring C-Pap to the hospital.

## 2016-03-24 NOTE — Pre-Procedure Instructions (Signed)
ANESTHESIA - CARDIOLOGY NOTE INDICATES PATIENT NEEDS NO FURTHER WORKUP PRIOR TO HER CHOLECYSTECTOMY, SEE ASSESSMENT AND PLAN     Wellington Hampshire, MD Physician Signed Cardiology Progress Notes 03/17/2016 2:03 PM  Related encounter: Office Visit from 03/17/2016 in [facility]      Cardiology Office Note   Date: 03/17/2016   ID: MERY DEATER, DOB January 02, 1956, MRN [id number]  PCP: Elsie Stain, MD Cardiologist: Kathlyn Sacramento, MD   Chief Complaint  Patient presents with  . other    C/o chest pain went to ED due to Gallstones and ulcers. Meds reviewd verbally with pt.     History of Present Illness: Andrea Webster is a 60 y.o. female who presents for A follow-up visit regarding coronary artery disease. She has known history of CAD s/p MI at the age of 39, with PTCA performed to unknown vessel.She has a strong family history for CAD and also smoked until March 2016.She was seen by me last year for increased angina.  cardiac catheterization in June 2016 showed a chronic total occlusion of the RCA, severe mid LCx disease with a 90% stenosis, and moderate proximal LAD disease.Pressure wire analysis was performed within the LAD stenosis and her fractional flow reserve was found to be not significant at 0.83, thus no PCI was performed within the LAD.The LCx was felt to be the culprit vessel and this was successfully stented using a 2.75 x 15 mm Xience Alpine DES. She has been doing very well with no recurrent angina since then. She went to the emergency room recently with lower substernal chest pain radiating to her back described as back discomfort. Cardiac workup with enzymes were negative. Her LFTs were abnormal and thus she underwent an abdominal ultrasound which showed gallstones. She denies exertional symptoms.  Past Medical History  Diagnosis Date  . Arthritis   . headaches   . GERD (gastroesophageal  reflux disease)   . Hayfever   . CAD (coronary artery disease)     a. s/p MI & PTCA; b. Ex MV: basal inflat, mid inflat, apical inf, apical lat ischemia; c. 04/2015 PCI: LM nl, LAD 60p (FFR 0.83), D1 60ost, LCX 50p, 39m (2.75x15 Xience Alpine DES), OM1/2/3 min irregs, RCA 122m CTO.  . High cholesterol   . Sciatica   . Smoker     a. quit 01/2014.  Marland Kitchen RLS (restless legs syndrome)   . Insomnia   . Anxiety   . Depression     intolerant of paxil, wellbutrin, zoloft  . Baker's cyst   . Heart murmur   . Hypertension     Past Surgical History  Procedure Laterality Date  . Appendectomy  1978  . Cesarean section    . Vaginal hysterectomy    . Angioplasty  1997  . Carpal tunnel release    . Cardiac catheterization      [place]  . Coronary angioplasty      [facility]   . Cardiac catheterization N/A 04/30/2015    Procedure: Left Heart Cath; Surgeon: Wellington Hampshire, MD; Location: [facility]; Service: Cardiovascular; Laterality: N/A;     Current Outpatient Prescriptions  Medication Sig Dispense Refill  . ALPRAZolam (XANAX) 1 MG tablet TAKE 1 TABLET THREE TIMES DAILY AS NEEDED FOR ANXIETY 270 tablet 1  . aspirin 81 MG chewable tablet Chew 81 mg by mouth daily.    Marland Kitchen atorvastatin (LIPITOR) 80 MG  tablet TAKE 1 TABLET EVERY DAY AT 6 PM 90 tablet 3  . clopidogrel (PLAVIX) 75 MG tablet TAKE 1 TABLET DAILY WITH BREAKFAST 90 tablet 3  . famotidine (PEPCID) 20 MG tablet Take 1 tablet (20 mg total) by mouth 2 (two) times daily. 60 tablet 0  . fluticasone (FLONASE) 50 MCG/ACT nasal spray USE TWO SPRAY(S) IN EACH NOSTRIL ONCE DAILY 16 g 5  . HYDROcodone-homatropine (HYCODAN) 5-1.5 MG/5ML syrup Take 5 mLs by mouth at bedtime as needed. 50 mL 0  . isosorbide mononitrate (IMDUR) 60 MG 24 hr tablet TAKE 1 TABLET  EVERY DAY 90 tablet 3  . metoprolol tartrate (LOPRESSOR) 25 MG tablet TAKE 1 TABLET TWICE DAILY 180 tablet 3  . Multiple Vitamin (MULTIVITAMIN) tablet Take 1 tablet by mouth daily.     . nitroGLYCERIN (NITROSTAT) 0.4 MG SL tablet Place 1 tablet (0.4 mg total) under the tongue every 5 (five) minutes as needed for chest pain. 25 tablet 3  . ondansetron (ZOFRAN ODT) 4 MG disintegrating tablet Take 1 tablet (4 mg total) by mouth every 8 (eight) hours as needed for nausea or vomiting. 20 tablet 0  . oxyCODONE (ROXICODONE) 5 MG immediate release tablet Take 1 tablet (5 mg total) by mouth every 6 (six) hours as needed for breakthrough pain. 12 tablet 0  . pantoprazole (PROTONIX) 40 MG tablet TAKE 1 TABLET EVERY DAY 90 tablet 3  . senna (SENOKOT) 8.6 MG tablet Take 1 tablet by mouth as needed for constipation.     . sucralfate (CARAFATE) 1 g tablet Take 1 tablet (1 g total) by mouth 4 (four) times daily. 120 tablet 1  . VENTOLIN HFA 108 (90 Base) MCG/ACT inhaler INHALE 2 PUFFS EVERY 6 HOURS AS NEEDED FOR WHEEZING OR SHORTNESS OF BREATH. 54 g 1  . zolpidem (AMBIEN) 10 MG tablet Take 1 tablet (10 mg total) by mouth at bedtime as needed. for sleep 90 tablet 1   No current facility-administered medications for this visit.    Allergies: Paroxetine hcl; Sertraline hcl; and Wellbutrin    Social History: The patient  reports that she quit smoking about 13 months ago. Her smoking use included Cigarettes. She has a 6.6 pack-year smoking history. She has never used smokeless tobacco. She reports that she drinks alcohol. She reports that she does not use illicit drugs.   Family History: The patient's family history includes Alcohol abuse in her father; Breast cancer in her maternal grandmother; Diabetes in her mother; Heart attack in her father and mother; Heart disease in her father and mother; Hypertension in her mother. There is no history of Colon  cancer.    ROS: Please see the history of present illness. Otherwise, review of systems are positive for none. All other systems are reviewed and negative.    PHYSICAL EXAM: VS: BP 134/80 mmHg  Pulse 81  Ht 5' (1.524 m)  Wt 221 lb 8 oz (100.472 kg)  BMI 43.26 kg/m2 , BMI Body mass index is 43.26 kg/(m^2). GEN: Well nourished, well developed, in no acute distress  HEENT: normal  Neck: no JVD, carotid bruits, or masses Cardiac: RRR; no rubs, or gallops,no edema . 2/6 systolic ejection murmur in the aortic area which is early peaking Respiratory: clear to auscultation bilaterally, normal work of breathing GI: soft, nontender, nondistended, + BS MS: no deformity or atrophy  Skin: warm and dry, no rash Neuro: Strength and sensation are intact Psych: euthymic mood, full affect   EKG: EKG is not ordered today.  Recent Labs: 04/15/2015: TSH 2.84 03/13/2016: ALT 96*; BUN 10; Creatinine, Ser 0.71; Hemoglobin 13.0; Platelets 240; Potassium 4.2; Sodium 136    Lipid Panel  Labs (Brief)       Component Value Date/Time   CHOL 190 04/15/2015 1018   TRIG 166.0* 04/15/2015 1018   HDL 54.60 04/15/2015 1018   CHOLHDL 3 04/15/2015 1018   VLDL 33.2 04/15/2015 1018   LDLCALC 102* 04/15/2015 1018       Wt Readings from Last 3 Encounters:  03/17/16 221 lb 8 oz (100.472 kg)  03/13/16 211 lb (95.709 kg)  12/25/15 215 lb 1.9 oz (97.578 kg)      Other studies Reviewed: Additional studies/ records that were reviewed today include: Recent records from the emergency room. Review of the above records demonstrates: Troponin was negative. EKG was normal.   ASSESSMENT AND PLAN:  1. Coronary artery disease involving native coronary arteries without angina: She is overall doing well with no anginal symptoms. Continue medical therapy. If she needs to have cholecystectomy done, Plavix can be held 7 days before the surgery. I am planning to stop  Plavix anyway in June but it can be held before then if needed for surgery.  2. Hyperlipidemia: Continue high dose atorvastatin. She will need a follow-up lipid profile. Recent liver enzymes were mildly elevated which was attributed to gallstones. Another possibility could be high dose atorvastatin and thus a follow-up liver profile is recommended within the next month or 2.  3. Preoperative cardiovascular evaluation: The patient might need cholecystectomy done. She has no anginal symptoms and has been stable since her PCI in June 2016. Her EKG is normal. Thus, she does not require ischemic cardiac evaluation before surgery.  4. Aortic sclerosis murmur: I would consider repeat echocardiogram the next year.  Disposition: FU with me in 6 months  Signed,  Kathlyn Sacramento, MD  03/17/2016 2:03 PM  [place]

## 2016-03-24 NOTE — Pre-Procedure Instructions (Signed)
REPEAT EKG AT PRE ADMIT VISTI "OK" PER DR Ronelle Nigh

## 2016-03-30 ENCOUNTER — Ambulatory Visit: Payer: Commercial Managed Care - HMO | Admitting: Certified Registered Nurse Anesthetist

## 2016-03-30 ENCOUNTER — Ambulatory Visit: Payer: Commercial Managed Care - HMO

## 2016-03-30 ENCOUNTER — Ambulatory Visit
Admission: RE | Admit: 2016-03-30 | Discharge: 2016-03-30 | Disposition: A | Discharge status: Home / Self Care | Payer: Commercial Managed Care - HMO | Source: Ambulatory Visit | Attending: Surgery | Admitting: Surgery

## 2016-03-30 ENCOUNTER — Encounter
Admission: RE | Disposition: A | Discharge status: Home / Self Care | Payer: Self-pay | Source: Ambulatory Visit | Attending: Surgery

## 2016-03-30 ENCOUNTER — Encounter: Payer: Self-pay | Admitting: *Deleted

## 2016-03-30 DIAGNOSIS — I10 Essential (primary) hypertension: Secondary | ICD-10-CM | POA: Diagnosis not present

## 2016-03-30 DIAGNOSIS — K811 Chronic cholecystitis: Secondary | ICD-10-CM | POA: Insufficient documentation

## 2016-03-30 DIAGNOSIS — Z7902 Long term (current) use of antithrombotics/antiplatelets: Secondary | ICD-10-CM | POA: Diagnosis not present

## 2016-03-30 DIAGNOSIS — Z79899 Other long term (current) drug therapy: Secondary | ICD-10-CM | POA: Diagnosis not present

## 2016-03-30 DIAGNOSIS — M1991 Primary osteoarthritis, unspecified site: Secondary | ICD-10-CM | POA: Diagnosis not present

## 2016-03-30 DIAGNOSIS — F419 Anxiety disorder, unspecified: Secondary | ICD-10-CM | POA: Insufficient documentation

## 2016-03-30 DIAGNOSIS — I2510 Atherosclerotic heart disease of native coronary artery without angina pectoris: Secondary | ICD-10-CM | POA: Diagnosis not present

## 2016-03-30 DIAGNOSIS — Z87891 Personal history of nicotine dependence: Secondary | ICD-10-CM | POA: Insufficient documentation

## 2016-03-30 DIAGNOSIS — F418 Other specified anxiety disorders: Secondary | ICD-10-CM | POA: Diagnosis not present

## 2016-03-30 DIAGNOSIS — E7800 Pure hypercholesterolemia, unspecified: Secondary | ICD-10-CM | POA: Diagnosis not present

## 2016-03-30 DIAGNOSIS — Z7951 Long term (current) use of inhaled steroids: Secondary | ICD-10-CM | POA: Insufficient documentation

## 2016-03-30 DIAGNOSIS — K8000 Calculus of gallbladder with acute cholecystitis without obstruction: Secondary | ICD-10-CM | POA: Insufficient documentation

## 2016-03-30 DIAGNOSIS — Z7982 Long term (current) use of aspirin: Secondary | ICD-10-CM | POA: Diagnosis not present

## 2016-03-30 DIAGNOSIS — Z419 Encounter for procedure for purposes other than remedying health state, unspecified: Secondary | ICD-10-CM

## 2016-03-30 DIAGNOSIS — Z9861 Coronary angioplasty status: Secondary | ICD-10-CM | POA: Diagnosis not present

## 2016-03-30 DIAGNOSIS — K8020 Calculus of gallbladder without cholecystitis without obstruction: Secondary | ICD-10-CM | POA: Diagnosis not present

## 2016-03-30 DIAGNOSIS — F329 Major depressive disorder, single episode, unspecified: Secondary | ICD-10-CM | POA: Diagnosis not present

## 2016-03-30 DIAGNOSIS — K8050 Calculus of bile duct without cholangitis or cholecystitis without obstruction: Secondary | ICD-10-CM | POA: Diagnosis not present

## 2016-03-30 DIAGNOSIS — K219 Gastro-esophageal reflux disease without esophagitis: Secondary | ICD-10-CM | POA: Insufficient documentation

## 2016-03-30 HISTORY — PX: CHOLECYSTECTOMY: SHX55

## 2016-03-30 SURGERY — LAPAROSCOPIC CHOLECYSTECTOMY WITH INTRAOPERATIVE CHOLANGIOGRAM
Anesthesia: General | Wound class: Clean Contaminated

## 2016-03-30 MED ORDER — SUGAMMADEX SODIUM 500 MG/5ML IV SOLN
INTRAVENOUS | Status: DC | PRN
  Administered 2016-03-30: 200 mg via INTRAVENOUS

## 2016-03-30 MED ORDER — BUPIVACAINE-EPINEPHRINE (PF) 0.25% -1:200000 IJ SOLN
INTRAMUSCULAR | Status: AC
  Filled 2016-03-30: qty 30

## 2016-03-30 MED ORDER — EPHEDRINE SULFATE 50 MG/ML IJ SOLN
INTRAMUSCULAR | Status: DC | PRN
  Administered 2016-03-30: 15 mg via INTRAVENOUS

## 2016-03-30 MED ORDER — BUPIVACAINE-EPINEPHRINE 0.25% -1:200000 IJ SOLN
INTRAMUSCULAR | Status: DC | PRN
  Administered 2016-03-30: 30 mL

## 2016-03-30 MED ORDER — FENTANYL CITRATE (PF) 100 MCG/2ML IJ SOLN
INTRAMUSCULAR | Status: DC
Start: 2016-03-30 — End: 2016-03-30
  Filled 2016-03-30: qty 2

## 2016-03-30 MED ORDER — CHLORHEXIDINE GLUCONATE 4 % EX LIQD: 1.0000 "application " | Freq: Once | CUTANEOUS | Status: DC

## 2016-03-30 MED ORDER — CEFOXITIN SODIUM 1 G IV SOLR
1.0000 g | INTRAVENOUS | Status: AC
  Administered 2016-03-30: 1 g via INTRAVENOUS
  Filled 2016-03-30: qty 1

## 2016-03-30 MED ORDER — KETOROLAC TROMETHAMINE 30 MG/ML IJ SOLN
INTRAMUSCULAR | Status: DC | PRN
  Administered 2016-03-30: 30 mg via INTRAVENOUS

## 2016-03-30 MED ORDER — ONDANSETRON HCL 4 MG/2ML IJ SOLN: 4.0000 mg | Freq: Once | INTRAMUSCULAR | Status: DC | PRN

## 2016-03-30 MED ORDER — ACETAMINOPHEN 10 MG/ML IV SOLN
INTRAVENOUS | Status: AC
  Filled 2016-03-30: qty 100

## 2016-03-30 MED ORDER — FENTANYL CITRATE (PF) 100 MCG/2ML IJ SOLN
INTRAMUSCULAR | Status: DC | PRN
  Administered 2016-03-30 (×4): 50 ug via INTRAVENOUS

## 2016-03-30 MED ORDER — SCOPOLAMINE 1 MG/3DAYS TD PT72
MEDICATED_PATCH | TRANSDERMAL | Status: AC
  Filled 2016-03-30: qty 1

## 2016-03-30 MED ORDER — ROCURONIUM BROMIDE 100 MG/10ML IV SOLN
INTRAVENOUS | Status: DC | PRN
  Administered 2016-03-30: 10 mg via INTRAVENOUS
  Administered 2016-03-30: 20 mg via INTRAVENOUS

## 2016-03-30 MED ORDER — DEXAMETHASONE SODIUM PHOSPHATE 10 MG/ML IJ SOLN
INTRAMUSCULAR | Status: DC | PRN
  Administered 2016-03-30: 10 mg via INTRAVENOUS

## 2016-03-30 MED ORDER — LIDOCAINE HCL (CARDIAC) 20 MG/ML IV SOLN
INTRAVENOUS | Status: DC | PRN
  Administered 2016-03-30: 60 mg via INTRAVENOUS

## 2016-03-30 MED ORDER — FENTANYL CITRATE (PF) 100 MCG/2ML IJ SOLN
25.0000 ug | INTRAMUSCULAR | Status: DC | PRN
  Administered 2016-03-30 (×3): 25 ug via INTRAVENOUS

## 2016-03-30 MED ORDER — SUCCINYLCHOLINE CHLORIDE 20 MG/ML IJ SOLN
INTRAMUSCULAR | Status: DC | PRN
  Administered 2016-03-30: 100 mg via INTRAVENOUS

## 2016-03-30 MED ORDER — MIDAZOLAM HCL 2 MG/2ML IJ SOLN
INTRAMUSCULAR | Status: DC | PRN
  Administered 2016-03-30: 2 mg via INTRAVENOUS

## 2016-03-30 MED ORDER — ACETAMINOPHEN 10 MG/ML IV SOLN
INTRAVENOUS | Status: DC | PRN
  Administered 2016-03-30: 1000 mg via INTRAVENOUS

## 2016-03-30 MED ORDER — PROPOFOL 10 MG/ML IV BOLUS
INTRAVENOUS | Status: DC | PRN
  Administered 2016-03-30: 140 mg via INTRAVENOUS

## 2016-03-30 MED ORDER — IOPAMIDOL (ISOVUE-300) INJECTION 61%
INTRAVENOUS | Status: DC | PRN
  Administered 2016-03-30: 15 mL via INTRAVENOUS

## 2016-03-30 MED ORDER — LACTATED RINGERS IV SOLN
INTRAVENOUS | Status: DC
  Administered 2016-03-30: 11:00:00 via INTRAVENOUS

## 2016-03-30 MED ORDER — SCOPOLAMINE 1 MG/3DAYS TD PT72
1.0000 | MEDICATED_PATCH | Freq: Once | TRANSDERMAL | Status: DC
  Administered 2016-03-30: 1.5 mg via TRANSDERMAL

## 2016-03-30 MED ORDER — ONDANSETRON HCL 4 MG/2ML IJ SOLN
INTRAMUSCULAR | Status: DC | PRN
  Administered 2016-03-30: 4 mg via INTRAVENOUS

## 2016-03-30 MED ORDER — OXYCODONE-ACETAMINOPHEN 7.5-325 MG PO TABS: 1.0000 | ORAL_TABLET | ORAL | Status: DC | PRN

## 2016-03-30 SURGICAL SUPPLY — 40 items
APPLIER CLIP 5 13 M/L LIGAMAX5 (MISCELLANEOUS) ×3
BLADE SURG 15 STRL LF DISP TIS (BLADE) ×1 IMPLANT
BLADE SURG 15 STRL SS (BLADE) ×2
CANISTER SUCT 1200ML W/VALVE (MISCELLANEOUS) ×3 IMPLANT
CHLORAPREP W/TINT 26ML (MISCELLANEOUS) ×3 IMPLANT
CHOLANGIOGRAM CATH TAUT (CATHETERS) ×3 IMPLANT
CLEANER CAUTERY TIP 5X5 PAD (MISCELLANEOUS) ×1 IMPLANT
CLIP APPLIE 5 13 M/L LIGAMAX5 (MISCELLANEOUS) ×1 IMPLANT
DECANTER SPIKE VIAL GLASS SM (MISCELLANEOUS) IMPLANT
DEVICE TROCAR PUNCTURE CLOSURE (ENDOMECHANICALS) IMPLANT
DRAPE C-ARM XRAY 36X54 (DRAPES) ×3 IMPLANT
ELECT REM PT RETURN 9FT ADLT (ELECTROSURGICAL) ×3
ELECTRODE REM PT RTRN 9FT ADLT (ELECTROSURGICAL) ×1 IMPLANT
ENDOPOUCH RETRIEVER 10 (MISCELLANEOUS) ×3 IMPLANT
GLOVE BIO SURGEON STRL SZ7 (GLOVE) ×15 IMPLANT
GOWN STRL REUS W/ TWL LRG LVL3 (GOWN DISPOSABLE) ×3 IMPLANT
GOWN STRL REUS W/TWL LRG LVL3 (GOWN DISPOSABLE) ×6
IRRIGATION STRYKERFLOW (MISCELLANEOUS) ×1 IMPLANT
IRRIGATOR STRYKERFLOW (MISCELLANEOUS) ×3
IV CATH ANGIO 12GX3 LT BLUE (NEEDLE) ×3 IMPLANT
IV SOD CHL 0.9% 1000ML (IV SOLUTION) ×3 IMPLANT
L-HOOK LAP DISP 36CM (ELECTROSURGICAL) ×3
LHOOK LAP DISP 36CM (ELECTROSURGICAL) ×1 IMPLANT
LIQUID BAND (GAUZE/BANDAGES/DRESSINGS) ×3 IMPLANT
NEEDLE HYPO 22GX1.5 SAFETY (NEEDLE) ×3 IMPLANT
PACK LAP CHOLECYSTECTOMY (MISCELLANEOUS) ×3 IMPLANT
PAD CLEANER CAUTERY TIP 5X5 (MISCELLANEOUS) ×2
PENCIL ELECTRO HAND CTR (MISCELLANEOUS) ×3 IMPLANT
SCISSORS METZENBAUM CVD 33 (INSTRUMENTS) ×3 IMPLANT
SLEEVE ENDOPATH XCEL 5M (ENDOMECHANICALS) ×6 IMPLANT
STOPCOCK 3 WAY  REPLAC (MISCELLANEOUS) ×3 IMPLANT
SUT ETHIBOND 0 MO6 C/R (SUTURE) IMPLANT
SUT MNCRL AB 4-0 PS2 18 (SUTURE) ×3 IMPLANT
SUT VIC AB 0 CT2 27 (SUTURE) IMPLANT
SUT VICRYL 0 AB UR-6 (SUTURE) ×6 IMPLANT
SYR 20CC LL (SYRINGE) ×3 IMPLANT
TROCAR XCEL BLUNT TIP 100MML (ENDOMECHANICALS) ×3 IMPLANT
TROCAR XCEL NON-BLD 5MMX100MML (ENDOMECHANICALS) ×3 IMPLANT
TUBING INSUFFLATOR HI FLOW (MISCELLANEOUS) ×3 IMPLANT
WATER STERILE IRR 1000ML POUR (IV SOLUTION) ×3 IMPLANT

## 2016-03-30 NOTE — Anesthesia Procedure Notes (Signed)
Procedure Name: Intubation Date/Time: 03/30/2016 11:47 AM Performed by: Kennon Holter Pre-anesthesia Checklist: Timeout performed, Patient being monitored, Suction available, Emergency Drugs available and Patient identified Patient Re-evaluated:Patient Re-evaluated prior to inductionOxygen Delivery Method: Circle system utilized Preoxygenation: Pre-oxygenation with 100% oxygen Intubation Type: IV induction Ventilation: Mask ventilation without difficulty Laryngoscope Size: Miller and 2 Grade View: Grade I Tube type: Oral Tube size: 7.0 mm Number of attempts: 1 Airway Equipment and Method: Stylet Placement Confirmation: ETT inserted through vocal cords under direct vision,  positive ETCO2 and breath sounds checked- equal and bilateral Secured at: 21 cm Tube secured with: Tape Dental Injury: Teeth and Oropharynx as per pre-operative assessment

## 2016-03-30 NOTE — Op Note (Signed)
Laparoscopic Cholecystectomy  Pre-operative Diagnosis: Symptomatic Cholelithiasis  Post-operative Diagnosis: SAME  Procedure: Laparoscopic cholecystectomy w IOC  Surgeon: Caroleen Hamman, MD FACS  Anesthesia: Gen. with endotracheal tube  Findings: Chronic Cholecystitis  Normal Cholangiogram  Estimated Blood Loss: 10 cc         Drains: none         Specimens: Gallbladder           Complications: none  Procedure Details  The patient was seen again in the Holding Room. The benefits, complications, treatment options, and expected outcomes were discussed with the patient. The risks of bleeding, infection, recurrence of symptoms, failure to resolve symptoms, bile duct damage, bile duct leak, retained common bile duct stone, bowel injury, any of which could require further surgery and/or ERCP, stent, or papillotomy were reviewed with the patient. The likelihood of improving the patient's symptoms with return to their baseline status is good.  The patient and/or family concurred with the proposed plan, giving informed consent.  The patient was taken to Operating Room, identified as Andrea Webster and the procedure verified as Laparoscopic Cholecystectomy. A Time Out was held and the above information confirmed.  Prior to the induction of general anesthesia, antibiotic prophylaxis was administered. VTE prophylaxis was in place. General endotracheal anesthesia was then administered and tolerated well. After the induction, the abdomen was prepped with Chloraprep and draped in the sterile fashion. The patient was positioned in the supine position.  Local anesthetic  was injected into the skin near the umbilicus and an incision made. Cut down technique was used to enter the abdominal cavity and a Hasson trochar was placed after two vicryl stitches were anchored to the fascia. Pneumoperitoneum was then created with CO2 and tolerated well without any adverse changes in the patient's vital signs.  Three  5-mm ports were placed in the right upper quadrant all under direct vision. All skin incisions  were infiltrated with a local anesthetic agent before making the incision and placing the trocars.   The patient was positioned  in reverse Trendelenburg, tilted slightly to the patient's left.  The gallbladder was identified, the fundus grasped and retracted cephalad. Adhesions were lysed bluntly. The infundibulum was grasped and retracted laterally, exposing the peritoneum overlying the triangle of Calot. This was then divided and exposed in a blunt fashion. An extended critical view of the cystic duct and cystic artery was obtained. The cystic duct was clearly identified and bluntly dissected.  Cholangiogram performed. Antegrade and retrograde flow w/o filling defects. No CBD injury. Cystic Artery and duct were double clipped and divided. The gallbladder was taken from the gallbladder fossa in a retrograde fashion with the electrocautery. The gallbladder was removed and placed in an Endocatch bag. The liver bed was irrigated and inspected. Hemostasis was achieved with the electrocautery. Copious irrigation was utilized and was repeatedly aspirated until clear.  The gallbladder and Endocatch sac were then removed through the epigastric port site.   Inspection of the right upper quadrant was performed. No bleeding, bile duct injury or leak, or bowel injury was noted. Pneumoperitoneum was released.  The periumbilical port site was closed with figure-of-eight 0 Vicryl sutures. 4-0 subcuticular Monocryl was used to close the skin. Dermabond was  applied.  The patient was then extubated and brought to the recovery room in stable condition. Sponge, lap, and needle counts were correct at closure and at the conclusion of the case.             [place],  MD, FACS

## 2016-03-30 NOTE — Interval H&P Note (Signed)
History and Physical Interval Note:  03/30/2016 11:28 AM  Andrea Webster  has presented today for surgery, with the diagnosis of BILIARY COLIC  The various methods of treatment have been discussed with the patient and family. After consideration of risks, benefits and other options for treatment, the patient has consented to  Procedure(s): LAPAROSCOPIC CHOLECYSTECTOMY WITH INTRAOPERATIVE CHOLANGIOGRAM (N/A) as a surgical intervention .  The patient's history has been reviewed, patient examined, no change in status, stable for surgery.  I have reviewed the patient's chart and labs.  Questions were answered to the patient's satisfaction.     [name]

## 2016-03-30 NOTE — Transfer of Care (Signed)
Immediate Anesthesia Transfer of Care Note  Patient: Andrea Webster  Procedure(s) Performed: Procedure(s): LAPAROSCOPIC CHOLECYSTECTOMY WITH INTRAOPERATIVE CHOLANGIOGRAM (N/A)  Patient Location: PACU  Anesthesia Type:General  Level of Consciousness: awake, alert , oriented and patient cooperative  Airway & Oxygen Therapy: Patient Spontanous Breathing and Patient connected to face mask oxygen  Post-op Assessment: Report given to RN and Post -op Vital signs reviewed and stable  Post vital signs: Reviewed and stable  Last Vitals:  Filed Vitals:   03/30/16 1033 03/30/16 1309  BP: 132/67   Pulse: 86   Temp:  36.2 C  Resp: 16     Last Pain:  Filed Vitals:   03/30/16 1309  PainSc: 0-No pain         Complications: No apparent anesthesia complications

## 2016-03-30 NOTE — Anesthesia Preprocedure Evaluation (Signed)
Anesthesia Evaluation  Patient identified by MRN, date of birth, ID band Patient awake    Reviewed: Allergy & Precautions, H&P , NPO status , Patient's Chart, lab work & pertinent test results, reviewed documented beta blocker date and time   History of Anesthesia Complications (+) PONV and history of anesthetic complications  Airway Mallampati: III  TM Distance: >3 FB Neck ROM: full    Dental no notable dental hx. (+) Partial Upper, Missing, Chipped, Caps   Pulmonary neg shortness of breath, neg sleep apnea, neg COPD, neg recent URI, former smoker,    Pulmonary exam normal breath sounds clear to auscultation       Cardiovascular Exercise Tolerance: Good hypertension, (-) angina+ CAD, + Past MI and + Cardiac Stents  (-) CABG Normal cardiovascular exam(-) dysrhythmias + Valvular Problems/Murmurs  Rhythm:regular Rate:Normal     Neuro/Psych neg Seizures PSYCHIATRIC DISORDERS (Depression and anxiety)  Neuromuscular disease    GI/Hepatic Neg liver ROS, GERD  ,  Endo/Other  neg diabetesMorbid obesity  Renal/GU negative Renal ROS  negative genitourinary   Musculoskeletal   Abdominal   Peds  Hematology negative hematology ROS (+)   Anesthesia Other Findings Past Medical History:   Arthritis                                                    headaches                                                    GERD (gastroesophageal reflux disease)                       Hayfever                                                     CAD (coronary artery disease)                                  Comment:a. s/p MI & PTCA;  b. Ex MV: basal inflat, mid               inflat, apical inf, apical lat ischemia;  c.               04/2015 PCI: LM nl, LAD 60p (FFR 0.83), D1               60ost, LCX 50p, 74m (2.75x15 Xience Alpine               DES), OM1/2/3 min irregs, RCA 195m CTO.   High cholesterol                                            Sciatica  Smoker                                                         Comment:a. quit 01/2014.   RLS (restless legs syndrome)                                 Insomnia                                                     Anxiety                                                      Depression                                                     Comment:intolerant of paxil, wellbutrin, zoloft   Baker's cyst                                                   Comment:x 2   Heart murmur                                                 Hypertension                                                 Myocardial infarction ([place])                                  Reproductive/Obstetrics negative OB ROS                             Anesthesia Physical Anesthesia Plan  ASA: III  Anesthesia Plan: General   Post-op Pain Management:    Induction:   Airway Management Planned:   Additional Equipment:   Intra-op Plan:   Post-operative Plan:   Informed Consent: I have reviewed the patients History and Physical, chart, labs and discussed the procedure including the risks, benefits and alternatives for the proposed anesthesia with the patient or authorized representative who has indicated his/her understanding and acceptance.   Dental Advisory Given  Plan Discussed with: Anesthesiologist, CRNA and Surgeon  Anesthesia Plan Comments:         Anesthesia Quick Evaluation

## 2016-03-30 NOTE — Anesthesia Postprocedure Evaluation (Signed)
Anesthesia Post Note  Patient: Andrea Webster  Procedure(s) Performed: Procedure(s) (LRB): LAPAROSCOPIC CHOLECYSTECTOMY WITH INTRAOPERATIVE CHOLANGIOGRAM (N/A)  Patient location during evaluation: PACU Anesthesia Type: General Level of consciousness: awake and alert Pain management: pain level controlled Vital Signs Assessment: post-procedure vital signs reviewed and stable Respiratory status: spontaneous breathing, nonlabored ventilation, respiratory function stable and patient connected to nasal cannula oxygen Cardiovascular status: blood pressure returned to baseline and stable Postop Assessment: no signs of nausea or vomiting Anesthetic complications: no    Last Vitals:  Filed Vitals:   03/30/16 1309 03/30/16 1408  BP:  122/65  Pulse:  69  Temp: 36.2 C 35.9 C  Resp:  16    Last Pain:  Filed Vitals:   03/30/16 1412  PainSc: 2                  Martha Clan

## 2016-03-30 NOTE — Discharge Instructions (Signed)
General Anesthesia, Adult °General anesthesia is a sleep-like state of non-feeling produced by medicines (anesthetics). General anesthesia prevents you from being alert and feeling pain during a medical procedure. Your caregiver may recommend general anesthesia if your procedure: °· Is long. °· Is painful or uncomfortable. °· Would be frightening to see or hear. °· Requires you to be still. °· Affects your breathing. °· Causes significant blood loss. °LET YOUR CAREGIVER KNOW ABOUT: °· Allergies to food or medicine. °· Medicines taken, including vitamins, herbs, eyedrops, over-the-counter medicines, and creams. °· Use of steroids (by mouth or creams). °· Previous problems with anesthetics or numbing medicines, including problems experienced by relatives. °· History of bleeding problems or blood clots. °· Previous surgeries and types of anesthetics received. °· Possibility of pregnancy, if this applies. °· Use of cigarettes, alcohol, or illegal drugs. °· Any health condition(s), especially diabetes, sleep apnea, and high blood pressure. °RISKS AND COMPLICATIONS °General anesthesia rarely causes complications. However, if complications do occur, they can be life threatening. Complications include: °· A lung infection. °· A stroke. °· A heart attack. °· Waking up during the procedure. When this occurs, the patient may be unable to move and communicate that he or she is awake. The patient may feel severe pain. °Older adults and adults with serious medical problems are more likely to have complications than adults who are young and healthy. Some complications can be prevented by answering all of your caregiver's questions thoroughly and by following all pre-procedure instructions. It is important to tell your caregiver if any of the pre-procedure instructions, especially those related to diet, were not followed. Any food or liquid in the stomach can cause problems when you are under general anesthesia. °BEFORE THE  PROCEDURE °· Ask your caregiver if you will have to spend the night at the hospital. If you will not have to spend the night, arrange to have an adult drive you and stay with you for 24 hours. °· Follow your caregiver's instructions if you are taking dietary supplements or medicines. Your caregiver may tell you to stop taking them or to reduce your dosage. °· Do not smoke for as long as possible before your procedure. If possible, stop smoking 3-6 weeks before the procedure. °· Do not take new dietary supplements or medicines within 1 week of your procedure unless your caregiver approves them. °· Do not eat within 8 hours of your procedure or as directed by your caregiver. Drink only clear liquids, such as water, black coffee (without milk or cream), and fruit juices (without pulp). °· Do not drink within 3 hours of your procedure or as directed by your caregiver. °· You may brush your teeth on the morning of the procedure, but make sure to spit out the toothpaste and water when finished. °PROCEDURE  °You will receive anesthetics through a mask, through an intravenous (IV) access tube, or through both. A doctor who specializes in anesthesia (anesthesiologist) or a nurse who specializes in anesthesia (nurse anesthetist) or both will stay with you throughout the procedure to make sure you remain unconscious. He or she will also watch your blood pressure, pulse, and oxygen levels to make sure that the anesthetics do not cause any problems. Once you are asleep, a breathing tube or mask may be used to help you breathe. °AFTER THE PROCEDURE °You will wake up after the procedure is complete. You may be in the room where the procedure was performed or in a recovery area. You may have a sore throat   if a breathing tube was used. You may also feel: °· Dizzy. °· Weak. °· Drowsy. °· Confused. °· Nauseous. °· Cold. °These are all normal responses and can be expected to last for up to 24 hours after the procedure is complete. A  caregiver will tell you when you are ready to go home. This will usually be when you are fully awake and in stable condition. °  °This information is not intended to replace advice given to you by your health care provider. Make sure you discuss any questions you have with your health care provider. °  °Document Released: 02/14/2008 Document Revised: 11/28/2014 Document Reviewed: 03/07/2012 °Elsevier Interactive Patient Education ©2016 [employer] ° °

## 2016-03-30 NOTE — H&P (View-Only) (Signed)
Patient ID: Andrea Webster, female   DOB: 1956-09-26, 60 y.o.   MRN: [id number]  CC: ABDOMINAL PAIN  HPI Andrea Webster is a 60 y.o. female presents to clinic today for follow-up from a recent ER visit for right upper quadrant abdominal pain. Patient states that after having a fatty spicy meal over the weekend she had epigastric pain that radiated to her right upper quadrant through to her right back. She says it feels like a sharp fire poker was diving into her right abdomen. The pain lasted for approximately 4 hours but started within 30 minutes of eating a meal. She's had a history of indigestion before and may be some mild pain in the right upper quadrant but never this bad. She was evaluated in the emergency department and found to have gallstones with mildly elevated liver functions including a mildly elevated bilirubin at that time. She does have a significant heart history and was afraid with the midepigastric pain that it could represent a heart attack however cardiac workup was negative. She is also already been seen by cardiology who gave permission to stop her aspirin and Plavix prior to surgical dimension. Since being seen in the emergency department she has not had any fevers, chills, nausea, vomiting, diarrhea, constipation, chest pain, shortness of breath.  HPI  Past Medical History  Diagnosis Date  . Arthritis   . headaches   . GERD (gastroesophageal reflux disease)   . Hayfever   . CAD (coronary artery disease)     a. s/p MI & PTCA;  b. Ex MV: basal inflat, mid inflat, apical inf, apical lat ischemia;  c. 04/2015 PCI: LM nl, LAD 60p (FFR 0.83), D1 60ost, LCX 50p, 61m (2.75x15 Xience Alpine DES), OM1/2/3 min irregs, RCA 179m CTO.  . High cholesterol   . Sciatica   . Smoker     a. quit 01/2014.  Marland Kitchen RLS (restless legs syndrome)   . Insomnia   . Anxiety   . Depression     intolerant of paxil, wellbutrin, zoloft  . Baker's cyst   . Heart murmur   . Hypertension     Past  Surgical History  Procedure Laterality Date  . Appendectomy  1978  . Cesarean section    . Vaginal hysterectomy    . Angioplasty  1997  . Carpal tunnel release    . Cardiac catheterization      [place]  . Coronary angioplasty      [facility]   . Cardiac catheterization N/A 04/30/2015    Procedure: Left Heart Cath;  Surgeon: Wellington Hampshire, MD;  Location: [facility];  Service: Cardiovascular;  Laterality: N/A;    Family History  Problem Relation Age of Onset  . Heart disease Mother   . Diabetes Mother   . Hypertension Mother   . Heart attack Mother   . Alcohol abuse Father   . Heart disease Father   . Heart attack Father   . Breast cancer Maternal Grandmother   . Colon cancer Neg Hx     Social History Social History  Substance Use Topics  . Smoking status: Former Smoker -- 0.33 packs/day for 20 years    Types: Cigarettes    Quit date: 02/13/2015  . Smokeless tobacco: Never Used  . Alcohol Use: 0.0 oz/week    0 Standard drinks or equivalent per week     Comment: rare    Allergies  Allergen Reactions  .  Paroxetine Hcl   . Sertraline Hcl   . Wellbutrin [Bupropion Hcl]     Current Outpatient Prescriptions  Medication Sig Dispense Refill  . ALPRAZolam (XANAX) 1 MG tablet TAKE 1 TABLET THREE TIMES DAILY AS NEEDED FOR ANXIETY 270 tablet 1  . aspirin 81 MG chewable tablet Chew 81 mg by mouth daily.    Marland Kitchen atorvastatin (LIPITOR) 80 MG tablet TAKE 1 TABLET EVERY DAY  AT  6  PM 90 tablet 3  . clopidogrel (PLAVIX) 75 MG tablet TAKE 1 TABLET  DAILY WITH BREAKFAST 90 tablet 3  . famotidine (PEPCID) 20 MG tablet Take 1 tablet (20 mg total) by mouth 2 (two) times daily. 60 tablet 0  . fluticasone (FLONASE) 50 MCG/ACT nasal spray USE TWO SPRAY(S) IN EACH NOSTRIL ONCE DAILY 16 g 5  . HYDROcodone-homatropine (HYCODAN) 5-1.5 MG/5ML syrup Take 5 mLs by mouth at bedtime as needed. 50 mL 0  . isosorbide mononitrate (IMDUR) 60 MG 24 hr  tablet TAKE 1 TABLET EVERY DAY 90 tablet 3  . metoprolol tartrate (LOPRESSOR) 25 MG tablet TAKE 1 TABLET TWICE DAILY 180 tablet 3  . Multiple Vitamin (MULTIVITAMIN) tablet Take 1 tablet by mouth daily.      . nitroGLYCERIN (NITROSTAT) 0.4 MG SL tablet Place 1 tablet (0.4 mg total) under the tongue every 5 (five) minutes as needed for chest pain. 25 tablet 3  . ondansetron (ZOFRAN ODT) 4 MG disintegrating tablet Take 1 tablet (4 mg total) by mouth every 8 (eight) hours as needed for nausea or vomiting. 20 tablet 0  . oxyCODONE (ROXICODONE) 5 MG immediate release tablet Take 1 tablet (5 mg total) by mouth every 6 (six) hours as needed for breakthrough pain. 12 tablet 0  . pantoprazole (PROTONIX) 40 MG tablet TAKE 1 TABLET EVERY DAY 90 tablet 3  . senna (SENOKOT) 8.6 MG tablet Take 1 tablet by mouth as needed for constipation.     . sucralfate (CARAFATE) 1 g tablet Take 1 tablet (1 g total) by mouth 4 (four) times daily. 120 tablet 1  . VENTOLIN HFA 108 (90 Base) MCG/ACT inhaler INHALE 2 PUFFS EVERY 6  HOURS AS NEEDED FOR WHEEZING OR SHORTNESS OF BREATH. 54 g 1  . zolpidem (AMBIEN) 10 MG tablet Take 1 tablet (10 mg total) by mouth at bedtime as needed. for sleep 90 tablet 1   No current facility-administered medications for this visit.     Review of Systems A Multi-point review of systems was asked and was negative except for the findings documented in the history of present illness  Physical Exam Blood pressure 147/86, pulse 78, temperature 98.7 F (37.1 C), temperature source Oral, height 5\' 1"  (1.549 m), weight 100.699 kg (222 lb). CONSTITUTIONAL: No acute distress. EYES: Pupils are equal, round, and reactive to light, Sclera are non-icteric. EARS, NOSE, MOUTH AND THROAT: The oropharynx is clear. The oral mucosa is pink and moist. Hearing is intact to voice. LYMPH NODES:  Lymph nodes in the neck are normal. RESPIRATORY:  Lungs are clear. There is normal respiratory effort, with equal breath  sounds bilaterally, and without pathologic use of accessory muscles. CARDIOVASCULAR: Heart is regular without murmurs, gallops, or rubs. GI: The abdomen is large, soft, nontender, and nondistended. There are no palpable masses. There is no hepatosplenomegaly. There are normal bowel sounds in all quadrants. GU: Rectal deferred.   MUSCULOSKELETAL: Normal muscle strength and tone. No cyanosis or edema.   SKIN: Turgor is good and there are no pathologic skin lesions  or ulcers. NEUROLOGIC: Motor and sensation is grossly normal. Cranial nerves are grossly intact. PSYCH:  Oriented to person, place and time. Affect is normal.  Data Reviewed Images and labs reviewed. Labs are significant for a mild hyperbilirubinemia of 1.4, mild elevation of ALT and AST at 96 and 91, mild elevation of alkaline phosphatase at 131. Her white blood cell count was normal at 9.4. The ultrasound showed a possible hepatic steatosis and multiple gallstones without evidence of cholecystitis I have personally reviewed the patient's imaging, laboratory findings and medical records.    Assessment    Biliary colic    Plan    60 year old female with biliary colic and mild elevation of liver function tests. Discussed with the patient that the elevations in LFTs could be related to her heart history and cholesterol medications however it is also possible is from passing a stone. Given this was discussed the importance of doing a contrast study of the time of surgery to evaluate her ducts. I discussed the procedure of a laparoscopic cholecystectomy in detail.  The patient was given Neurosurgeon.  We discussed the risks and benefits of a laparoscopic cholecystectomy and possible cholangiogram including, but not limited to bleeding, infection, injury to surrounding structures such as the intestine or liver, bile leak, retained gallstones, need to convert to an open procedure, prolonged diarrhea, blood clots such as  DVT, common  bile duct injury, anesthesia risks, and possible need for additional procedures.  The likelihood of improvement in symptoms and return to the patient's normal status is good. We discussed the typical post-operative recovery course. Patient desires to proceed as soon as possible. Given my current schedule the earliest operative time in a safe window for her off of aspirin and Plavix his or time available for Dr. Dahlia Byes. Patient voiced understanding and agrees to have surgery with him. Plan for surgery on May 10 after being off of aspirin and Plavix for at least 7 days.      Time spent with the patient was 60 minutes, with more than 50% of the time spent in face-to-face education, counseling and care coordination.     Clayburn Pert, MD FACS General Surgeon 03/18/2016, 11:33 AM

## 2016-03-31 ENCOUNTER — Telehealth: Payer: Self-pay | Admitting: Cardiovascular Disease

## 2016-03-31 ENCOUNTER — Other Ambulatory Visit: Payer: Self-pay

## 2016-03-31 DIAGNOSIS — E785 Hyperlipidemia, unspecified: Secondary | ICD-10-CM

## 2016-03-31 LAB — SURGICAL PATHOLOGY

## 2016-03-31 MED ORDER — ATORVASTATIN CALCIUM 40 MG PO TABS: 40.0000 mg | ORAL_TABLET | Freq: Every day | ORAL | Status: DC

## 2016-03-31 NOTE — Telephone Encounter (Signed)
Pt calling stating yesterday she had a gallbladder procedure  Needs to know how to restart Plavix, was only told to be off it 7 days prior   Please call back

## 2016-03-31 NOTE — Telephone Encounter (Signed)
Per verbal from Dr. Fletcher Anon, if pt has had no bleeding since surgery, restart Plavix. Continue until June 30 then discontinue. S/w pt of recommendations. Pt asks if she can decrease lipitor. As liver enzymes were elevated at 4/34 hospital visit, Dr. Fletcher Anon is agreeable to decrease lipitor to 40mg  qd and recheck labs in 2 weeks. Pt agreeable w/plan. Prescription submitted, labs scheduled May 26, 9:50am. Pt understands these are fasting labs. Pt had no further questions.

## 2016-04-06 ENCOUNTER — Other Ambulatory Visit: Payer: Self-pay

## 2016-04-07 ENCOUNTER — Encounter: Payer: Self-pay | Admitting: Gastroenterology

## 2016-04-07 ENCOUNTER — Ambulatory Visit (INDEPENDENT_AMBULATORY_CARE_PROVIDER_SITE_OTHER): Payer: Commercial Managed Care - HMO | Admitting: Gastroenterology

## 2016-04-07 VITALS — BP 152/87 | HR 97 | Temp 98.1°F | Ht 60.0 in | Wt 219.0 lb

## 2016-04-07 DIAGNOSIS — R748 Abnormal levels of other serum enzymes: Secondary | ICD-10-CM

## 2016-04-07 NOTE — Progress Notes (Signed)
Gastroenterology Consultation  Referring Provider:     Tonia Ghent, MD Primary Care Physician:  Elsie Stain, MD Primary Gastroenterologist:  Dr. Allen Norris     Reason for Consultation:     Follow-up from ER        HPI:   Andrea Webster is a 60 y.o. y/o female referred for consultation & management of Follow-up from ER by Dr. Elsie Stain, MD.  As patient comes today and states that she had an ultrasound of her right upper quadrant and was told that she had ulcers based on the ultrasound. The patient comes with her husband who confirms that was what they were told. The patient states that since having her gallbladder out her epigastric discomfort heartburn and a lot of her other symptoms have resolved. The patient had been found to have findings on the ultrasound consistent with fatty liver disease. The patient also was found to have abnormal liver enzymes. The patient has been followed up with cardiology for the abnormal liver enzymes and her cholesterol medication was decreased. The patient has no complaint the present time except that she has some abdominal bloating after having her gallbladder out recently.  Past Medical History  Diagnosis Date  . Arthritis   . headaches   . GERD (gastroesophageal reflux disease)   . Hayfever   . CAD (coronary artery disease)     a. s/p MI & PTCA;  b. Ex MV: basal inflat, mid inflat, apical inf, apical lat ischemia;  c. 04/2015 PCI: LM nl, LAD 60p (FFR 0.83), D1 60ost, LCX 50p, 71m (2.75x15 Xience Alpine DES), OM1/2/3 min irregs, RCA 122m CTO.  . High cholesterol   . Sciatica   . Smoker     a. quit 01/2014.  Marland Kitchen RLS (restless legs syndrome)   . Insomnia   . Anxiety   . Depression     intolerant of paxil, wellbutrin, zoloft  . Baker's cyst     x 2  . Heart murmur   . Hypertension   . Myocardial infarction [facility])     Past Surgical History  Procedure Laterality Date  . Appendectomy  1978  . Cesarean section    . Vaginal hysterectomy    .  Angioplasty  1997  . Carpal tunnel release    . Cardiac catheterization      [place]  . Coronary angioplasty      [place]   . Cardiac catheterization N/A 04/30/2015    Procedure: Left Heart Cath;  Surgeon: Wellington Hampshire, MD;  Location: [facility];  Service: Cardiovascular;  Laterality: N/A;  . Cholecystectomy N/A 03/30/2016    Procedure: LAPAROSCOPIC CHOLECYSTECTOMY WITH INTRAOPERATIVE CHOLANGIOGRAM;  Surgeon: Jules Husbands, MD;  Location: [facility] ORS;  Service: General;  Laterality: N/A;    Prior to Admission medications   Medication Sig Start Date End Date Taking? Authorizing Provider  ALPRAZolam Duanne Moron) 1 MG tablet TAKE 1 TABLET THREE TIMES DAILY AS NEEDED FOR ANXIETY 12/01/15  Yes Tonia Ghent, MD  aspirin 81 MG chewable tablet Chew 81 mg by mouth daily.   Yes Historical Provider, MD  atorvastatin (LIPITOR) 40 MG tablet Take 1 tablet (40 mg total) by mouth daily. 03/31/16  Yes Wellington Hampshire, MD  clopidogrel (PLAVIX) 75 MG tablet TAKE 1 TABLET  DAILY WITH BREAKFAST 02/10/16  Yes Wellington Hampshire, MD  fluticasone (FLONASE) 50 MCG/ACT nasal spray USE TWO SPRAY(S) IN EACH NOSTRIL ONCE DAILY  12/01/15  Yes Tonia Ghent, MD  isosorbide mononitrate (IMDUR) 60 MG 24 hr tablet TAKE 1 TABLET Webster DAY 02/10/16  Yes Wellington Hampshire, MD  metoprolol tartrate (LOPRESSOR) 25 MG tablet TAKE 1 TABLET TWICE DAILY 02/10/16  Yes Wellington Hampshire, MD  Multiple Vitamin (MULTIVITAMIN) tablet Take 1 tablet by mouth daily.     Yes Historical Provider, MD  nitroGLYCERIN (NITROSTAT) 0.4 MG SL tablet Place 1 tablet (0.4 mg total) under the tongue Webster 5 (five) minutes as needed for chest pain. 05/01/15  Yes Rogelia Mire, NP  pantoprazole (PROTONIX) 40 MG tablet TAKE 1 TABLET Webster DAY 02/10/16  Yes Wellington Hampshire, MD  senna (SENOKOT) 8.6 MG tablet Take 1 tablet by mouth as needed for constipation.    Yes Historical Provider, MD  VENTOLIN HFA 108 (90  Base) MCG/ACT inhaler INHALE 2 PUFFS Webster 6  HOURS AS NEEDED FOR WHEEZING OR SHORTNESS OF BREATH. 02/12/16  Yes Tonia Ghent, MD  zolpidem (AMBIEN) 10 MG tablet Take 1 tablet (10 mg total) by mouth at bedtime as needed. for sleep 12/01/15  Yes Tonia Ghent, MD  atorvastatin (LIPITOR) 80 MG tablet Reported on 04/07/2016 02/12/16   Historical Provider, MD  HYDROcodone-homatropine (HYCODAN) 5-1.5 MG/5ML syrup Take 5 mLs by mouth at bedtime as needed. Patient not taking: Reported on 03/30/2016 12/25/15   Pleas Koch, NP  ondansetron (ZOFRAN ODT) 4 MG disintegrating tablet Take 1 tablet (4 mg total) by mouth Webster 8 (eight) hours as needed for nausea or vomiting. Patient not taking: Reported on 03/30/2016 03/13/16   Carrie Mew, MD  oxyCODONE (ROXICODONE) 5 MG immediate release tablet Take 1 tablet (5 mg total) by mouth Webster 6 (six) hours as needed for breakthrough pain. Patient not taking: Reported on 03/30/2016 03/13/16   Carrie Mew, MD  oxyCODONE-acetaminophen (PERCOCET) 7.5-325 MG tablet Take 1 tablet by mouth Webster 4 (four) hours as needed for severe pain. Patient not taking: Reported on 04/07/2016 03/30/16   Jules Husbands, MD  sucralfate (CARAFATE) 1 g tablet Take 1 tablet (1 g total) by mouth 4 (four) times daily. Patient not taking: Reported on 03/30/2016 03/13/16   Carrie Mew, MD    Family History  Problem Relation Age of Onset  . Heart disease Mother   . Diabetes Mother   . Hypertension Mother   . Heart attack Mother   . Alcohol abuse Father   . Heart disease Father   . Heart attack Father   . Breast cancer Maternal Grandmother   . Colon cancer Neg Hx      Social History  Substance Use Topics  . Smoking status: Former Smoker -- 0.33 packs/day for 20 years    Types: Cigarettes    Quit date: 02/13/2015  . Smokeless tobacco: Never Used  . Alcohol Use: 0.0 oz/week    0 Standard drinks or equivalent per week     Comment: rare    Allergies as of 04/07/2016 -  Review Complete 04/07/2016  Allergen Reaction Noted  . Paroxetine hcl  03/30/2011  . Sertraline hcl  03/30/2011  . Wellbutrin [bupropion hcl]  03/30/2011    Review of Systems:    All systems reviewed and negative except where noted in HPI.   Physical Exam:  BP 152/87 mmHg  Pulse 97  Temp(Src) 98.1 F (36.7 C) (Oral)  Ht 5' (1.524 m)  Wt 219 lb (99.338 kg)  BMI 42.77 kg/m2 No LMP recorded. Patient has had a hysterectomy. Psych:  Alert and cooperative. Normal mood and affect. General:   Alert,  Well-developed, well-nourished, pleasant and cooperative in NAD Head:  Normocephalic and atraumatic. Eyes:  Sclera clear, no icterus.   Conjunctiva pink. Ears:  Normal auditory acuity. Nose:  No deformity, discharge, or lesions. Mouth:  No deformity or lesions,oropharynx pink & moist. Neck:  Supple; no masses or thyromegaly. Lungs:  Respirations even and unlabored.  Clear throughout to auscultation.   No wheezes, crackles, or rhonchi. No acute distress. Heart:  Regular rate and rhythm; no murmurs, clicks, rubs, or gallops. Abdomen:  Normal bowel sounds.  No bruits.  Soft, non-tender and non-distended without masses, hepatosplenomegaly or hernias noted.  No guarding or rebound tenderness.  Negative Carnett sign.   Rectal:  Deferred.  Msk:  Symmetrical without gross deformities.  Good, equal movement & strength bilaterally. Pulses:  Normal pulses noted. Extremities:  No clubbing or edema.  No cyanosis. Neurologic:  Alert and oriented x3;  grossly normal neurologically. Skin:  Intact without significant lesions or rashes.  No jaundice. Lymph Nodes:  No significant cervical adenopathy. Psych:  Alert and cooperative. Normal mood and affect.  Imaging Studies: Dg Chest 2 View  03/13/2016  CLINICAL DATA:  Chest pain EXAM: CHEST  2 VIEW COMPARISON:  04/27/2015 FINDINGS: Lungs are clear.  No pleural effusion or pneumothorax. The heart is normal in size. Visualized osseous structures are within  normal limits. IMPRESSION: Normal chest radiographs. Electronically Signed   By: Julian Hy M.D.   On: 03/13/2016 12:20   Dg Cholangiogram Operative  03/30/2016  CLINICAL DATA:  60 year old female with a history of cholelithiasis EXAM: INTRAOPERATIVE CHOLANGIOGRAM TECHNIQUE: Cholangiographic images from the C-arm fluoroscopic device were submitted for interpretation post-operatively. Please see the procedural report for the amount of contrast and the fluoroscopy time utilized. COMPARISON:  None. FINDINGS: Surgical instruments project over the upper abdomen. There is cannulation of the cystic duct/gallbladder neck, with antegrade infusion of contrast. Caliber of the extrahepatic ductal system within normal limits. Note that the images have excluded the distal CBD and ampulla IMPRESSION: Limited images during intraoperative cholangiogram demonstrating partial opacification/ imaging of the extrahepatic biliary system. Please refer to the dictated operative report for full details of intraoperative findings and procedure. Signed, Dulcy Fanny. Earleen Newport, DO [facility] Electronically Signed   By: Corrie Mckusick D.O.   On: 03/30/2016 13:44   US Abdomen Limited Ruq  03/13/2016  CLINICAL DATA:  Elevated LFTs. EXAM: US ABDOMEN LIMITED - RIGHT UPPER QUADRANT COMPARISON:  None FINDINGS: Gallbladder: Several small echogenic foci along the dependent wall the bladder likely represent gallstones. No gallbladder wall thickening or pericholecystic fluid. Negative sonographic Murphy's sign. Common bile duct: Diameter: Not identified due to densely echogenic liver. Liver: Densely echogenic liver difficult to penetrate.  No duct dilatation. IMPRESSION: Densely echogenic liver commonly represents hepatic steatosis. Also consider hepatic cellular disease (e.g. Cirrhosis). Gallstones without evidence of cholecystitis. Electronically Signed   By: Suzy Bouchard M.D.   On:  03/13/2016 16:19    Assessment and Plan:   Andrea Webster is a 60 y.o. y/o female who comes in today with a report of being told that she has ulcers seen on ultrasound. The patient has been reassured that ulcers cannot be seen on ultrasound. The patient is not having any GI complaints at the present time. The patient does have fatty liver on the ultrasound with abnormal liver enzymes. The patient is due to have her liver enzymes checked when she sees  her cardiologist in 8 more days. The patient has been told that she has not had a colonoscopy in the past and should consider having a screening colonoscopy. The patient will also have her liver enzymes sent to me for evaluation and if they're still elevated the patient will need a workup to find the cause of her abnormal liver enzymes. The patient has been explained the plan and agrees with it.   Note: This dictation was prepared with Dragon dictation along with smaller phrase technology. Any transcriptional errors that result from this process are unintentional.

## 2016-04-14 ENCOUNTER — Ambulatory Visit (INDEPENDENT_AMBULATORY_CARE_PROVIDER_SITE_OTHER): Payer: Commercial Managed Care - HMO | Admitting: Surgery

## 2016-04-14 ENCOUNTER — Encounter: Payer: Self-pay | Admitting: Surgery

## 2016-04-14 VITALS — BP 123/71 | HR 74 | Temp 98.7°F | Wt 217.0 lb

## 2016-04-14 DIAGNOSIS — K8000 Calculus of gallbladder with acute cholecystitis without obstruction: Secondary | ICD-10-CM

## 2016-04-14 NOTE — Patient Instructions (Signed)
Please go to the lab and have your blood drawn to check on your liver enzymes.  Please call our office with any questions or concerns.  Please do not submerge in a tub, hot tub, or pool until incisions are completely sealed.  Use sun block to incision area over the next year if this area will be exposed to sun. This helps decrease scarring.  You may now resume your normal activities. Listen to your body when lifting, if you have pain when lifting, stop and then try again in a few days.  If you develop redness, drainage, or pain at incision sites- call our office immediately and speak with a nurse.  For your abdominal swelling you are able to put a heating pad for at least two times a day for a period of 15 minutes.

## 2016-04-14 NOTE — Progress Notes (Signed)
60yr old female s/p lap chole on 5/10 with Dr. Dahlia Byes.  Patient has been doing well.  She has her appetite back and energy as well.  She is not longer having pain.  She is having good bowel movements without any diarrhea.   Filed Vitals:   04/14/16 0932  BP: 123/71  Pulse: 74  Temp: 98.7 F (37.1 C)   PE:  Gen: NAD Abd: soft, incisions c/d/i, no signs of infection    A/P:  Patient healing well after lap chole.  She did have elevated liver enzymes from indirect bilirubin indicating liver source.  She has repeat labs and f/u with PCP next week.  She also was asking about colonoscopy which I encouraged her and her husband to have since she is over the age of 60 and never had one.  She can f/u prn .

## 2016-04-15 ENCOUNTER — Other Ambulatory Visit: Payer: Commercial Managed Care - HMO

## 2016-04-15 ENCOUNTER — Encounter: Payer: Self-pay | Admitting: Family Medicine

## 2016-04-15 ENCOUNTER — Other Ambulatory Visit (INDEPENDENT_AMBULATORY_CARE_PROVIDER_SITE_OTHER): Payer: Commercial Managed Care - HMO

## 2016-04-15 ENCOUNTER — Ambulatory Visit (INDEPENDENT_AMBULATORY_CARE_PROVIDER_SITE_OTHER): Payer: Commercial Managed Care - HMO | Admitting: Family Medicine

## 2016-04-15 VITALS — BP 122/82 | HR 72 | Temp 97.9°F | Wt 216.0 lb

## 2016-04-15 DIAGNOSIS — R945 Abnormal results of liver function studies: Secondary | ICD-10-CM

## 2016-04-15 DIAGNOSIS — R7989 Other specified abnormal findings of blood chemistry: Secondary | ICD-10-CM | POA: Diagnosis not present

## 2016-04-15 DIAGNOSIS — Z87891 Personal history of nicotine dependence: Secondary | ICD-10-CM | POA: Diagnosis not present

## 2016-04-15 DIAGNOSIS — Z1239 Encounter for other screening for malignant neoplasm of breast: Secondary | ICD-10-CM

## 2016-04-15 DIAGNOSIS — E785 Hyperlipidemia, unspecified: Secondary | ICD-10-CM | POA: Diagnosis not present

## 2016-04-15 DIAGNOSIS — Z1211 Encounter for screening for malignant neoplasm of colon: Secondary | ICD-10-CM

## 2016-04-15 DIAGNOSIS — Z9049 Acquired absence of other specified parts of digestive tract: Secondary | ICD-10-CM

## 2016-04-15 NOTE — Patient Instructions (Addendum)
Ebony Hail will call about your referral.  See her on the way out.    You can call for a mammogram at [facility] at [facility].  [place]  Glad to see you.

## 2016-04-15 NOTE — Progress Notes (Signed)
Pre visit review using our clinic review tool, if applicable. No additional management support is needed unless otherwise documented below in the visit note.  Intentional weight loss noted with diet.   D/w pt.   LFT elevation noted.  She had f/u lipids and LFTs today, results pending.  Per cards.   She is almost 1 year out from prev cardiac cath.   She is recovered from cholecystectomy, has seen surgery in the meantime.  No abd pain.  No need for sucralfate in the meantime, no opioids in the meantime.  No diarrhea.  Her cough is also better, since her surgery.   She quit smoking.    Mammogram encouraged.   D/w patient [id number] for colon cancer screening, including IFOB vs. colonoscopy.  Risks and benefits of both were discussed and patient voiced understanding.  Pt elects for: colonoscopy when she is off plavix.  It isn't emergent for her to go now, d/w pt.    Meds, vitals, and allergies reviewed.   ROS: Per HPI unless specifically indicated in ROS section   GEN: nad, alert and oriented HEENT: mucous membranes moist NECK: supple w/o LA CV: rrr.  PULM: ctab, no inc wob ABD: soft, +bs EXT: no edema SKIN: no acute rash No jaundice.

## 2016-04-16 LAB — LIPID PANEL
CHOL/HDL RATIO: 3.2 ratio (ref 0.0–4.4)
Cholesterol, Total: 157 mg/dL (ref 100–199)
HDL: 49 mg/dL (ref >39)
LDL CALC: 74 mg/dL (ref 0–99)
Triglycerides: 171 mg/dL — ABNORMAL HIGH (ref 0–149)
VLDL Cholesterol Cal: 34 mg/dL (ref 5–40)

## 2016-04-16 LAB — HEPATIC FUNCTION PANEL
ALBUMIN: 4.6 g/dL (ref 3.5–5.5)
ALT: 78 IU/L — ABNORMAL HIGH (ref 0–32)
AST: 66 IU/L — ABNORMAL HIGH (ref 0–40)
Alkaline Phosphatase: 131 IU/L — ABNORMAL HIGH (ref 39–117)
BILIRUBIN TOTAL: 0.7 mg/dL (ref 0.0–1.2)
Bilirubin, Direct: 0.2 mg/dL (ref 0.00–0.40)
TOTAL PROTEIN: 6.9 g/dL (ref 6.0–8.5)

## 2016-04-18 DIAGNOSIS — Z1211 Encounter for screening for malignant neoplasm of colon: Secondary | ICD-10-CM | POA: Insufficient documentation

## 2016-04-18 DIAGNOSIS — R7989 Other specified abnormal findings of blood chemistry: Secondary | ICD-10-CM | POA: Insufficient documentation

## 2016-04-18 DIAGNOSIS — R945 Abnormal results of liver function studies: Secondary | ICD-10-CM

## 2016-04-18 DIAGNOSIS — Z1239 Encounter for other screening for malignant neoplasm of breast: Secondary | ICD-10-CM | POA: Insufficient documentation

## 2016-04-18 DIAGNOSIS — Z9049 Acquired absence of other specified parts of digestive tract: Secondary | ICD-10-CM | POA: Insufficient documentation

## 2016-04-18 NOTE — Assessment & Plan Note (Signed)
Per cards  

## 2016-04-18 NOTE — Assessment & Plan Note (Signed)
D/w patient [id number] for colon cancer screening, including IFOB vs. colonoscopy. Risks and benefits of both were discussed and patient voiced understanding. Pt elects for: colonoscopy when she is off plavix. It isn't emergent for her to go now, d/w pt. Refer.

## 2016-04-18 NOTE — Assessment & Plan Note (Signed)
Mammogram encouraged.

## 2016-04-18 NOTE — Assessment & Plan Note (Signed)
No abd pain. No need for sucralfate in the meantime, no opioids in the meantime.  Her abd site are healing well.  Doing well overall. >25 minutes spent in face to face time with patient, >50% spent in counselling or coordination of care.

## 2016-04-18 NOTE — Assessment & Plan Note (Signed)
She quit smoking.  I thanked her.

## 2016-04-19 ENCOUNTER — Ambulatory Visit: Payer: Commercial Managed Care - HMO | Admitting: Cardiovascular Disease

## 2016-04-19 ENCOUNTER — Telehealth: Payer: Self-pay | Admitting: Cardiovascular Disease

## 2016-04-19 ENCOUNTER — Other Ambulatory Visit: Payer: Self-pay

## 2016-04-19 DIAGNOSIS — E785 Hyperlipidemia, unspecified: Secondary | ICD-10-CM

## 2016-04-19 MED ORDER — ROSUVASTATIN CALCIUM 10 MG PO TABS: 10.0000 mg | ORAL_TABLET | Freq: Every day | ORAL | Status: DC

## 2016-04-19 NOTE — Telephone Encounter (Signed)
Pt called back to review labs again.  Reviewed lipid/liver results and need to repeat labs in two months. Informed pt I have mailed copy of lab orders to take to PCP as she would like labs drawn there. She verbalized understanding with no further questions.

## 2016-04-19 NOTE — Telephone Encounter (Signed)
Pt has a question regarding her labs. Please call.

## 2016-04-19 NOTE — Telephone Encounter (Signed)
Left message on machine for patient to contact the office.   

## 2016-04-22 ENCOUNTER — Other Ambulatory Visit: Payer: Self-pay

## 2016-04-22 NOTE — Telephone Encounter (Signed)
Spoke with Tech Rachel Bo) with [facility] pharmacy the patient needs to discontinue the atorvastatin 40 mg and  replace with the rosuvastatin 10 mg one tablet everyday.

## 2016-04-30 ENCOUNTER — Other Ambulatory Visit: Payer: Self-pay | Admitting: Family Medicine

## 2016-05-02 NOTE — Telephone Encounter (Signed)
She should have about 1 month left.  Too early.  Have her notify us in about 2 weeks.  Thanks.

## 2016-05-02 NOTE — Telephone Encounter (Signed)
Electronic refill request. Last Filled:   Alprazolam  270 tablet 1 12/01/2015  Last Filled:   Zolpidem        90 tablet                                           1                                                      12/01/2015 Last office visit:   04/15/16.  Please advise.

## 2016-05-04 NOTE — Telephone Encounter (Signed)
Noted. Thanks.

## 2016-05-04 NOTE — Telephone Encounter (Signed)
Pt left v/m requesting cb about status of zolpidem and alprazolam refills.

## 2016-05-04 NOTE — Telephone Encounter (Signed)
Patient notified as instructed by telephone and verbalized understanding. Patient stated that she has plenty left and the only reason she called is because her mail order called her and told her that they had requested the refills and had not heard anything back from her doctor. Patient stated that she has plenty and will call back as instructed.

## 2016-05-12 ENCOUNTER — Telehealth: Payer: Self-pay | Admitting: Family Medicine

## 2016-05-12 NOTE — Telephone Encounter (Signed)
LM for pt to sch AWV, mn ° °

## 2016-05-17 ENCOUNTER — Other Ambulatory Visit: Payer: Self-pay | Admitting: Family Medicine

## 2016-05-17 NOTE — Telephone Encounter (Signed)
Electronic refill request. Last Filled:   Zolpidem 90 tablet 1 12/01/2015  Last Filled:   Alprazolam  270 tablet 1 12/01/2015  Last office visit:   04/15/16  Please advise.

## 2016-05-18 ENCOUNTER — Other Ambulatory Visit: Payer: Self-pay | Admitting: Family Medicine

## 2016-05-18 NOTE — Telephone Encounter (Signed)
Printed.  Please fax in. Thanks.  

## 2016-05-18 NOTE — Telephone Encounter (Signed)
Rx faxed to pharmacy as instructed. 

## 2016-05-25 ENCOUNTER — Telehealth: Payer: Self-pay

## 2016-05-25 ENCOUNTER — Telehealth: Payer: Self-pay | Admitting: Family Medicine

## 2016-05-25 NOTE — Telephone Encounter (Signed)
[facility] Patient Name: Andrea Webster DOB: 1956-05-10 Initial Comment Caller had gallbladder surgery exactly 8 weeks ago today. Knot about belly button not gone away. Lifted something yesterday and now knot is sore Nurse Assessment Nurse: Martyn Ehrich, RN, Felicia Date/Time (Eastern Time): 05/25/2016 3:02:04 PM Confirm and document reason for call. If symptomatic, describe symptoms. You must click the next button to save text entered. ---PT lifted a case of water with 32 bottles in it into the house. Now the knot that she has had surgery 8 wks ago to remove gall bladder. The knot is directly above the naval. Now it is a little swollen and sore. Mild pain. No fever. No redness. She said she has not spoken to the surgery. Has the patient traveled out of the country within the last 30 days? ---No Does the patient have any new or worsening symptoms? ---Yes Will a triage be completed? ---YesRelated visit to physician within the last 2 weeks? ---No Does the PT have any chronic conditions? (i.e. diabetes, asthma, etc.) ---No Is this a behavioral health or substance abuse call? ---No Guidelines Guideline Title Affirmed Question Affirmed Notes Post-Op Incision Symptoms Swelling of surgical incision Final Disposition User See Physician within 24 Hours Browning, RN, [place] Ice Comments Walking around normally without any dizziness pain is not there all the time and never more than mild (but mild swellling now onset today after lifting water) She says when she touches it it makes a sound - at previous cholycestectomy incision from 8 wks ago. She is not wanting to Dentist. Will check with PCP office to see if they want her to come to PCP or surgeon since 8 weeks after surgery recommended ER bc some swelling starting there - she is declininginstead of discussing appointment with office will tell them ER was  recommended bc mild swelling at previous incision but she declined. Told caller office will be notified that she refused ER disposition and someone will call her back from office but not sure when Conferenced Trina the triage nurse at [facility] to call (she agrees Psychologist, sport and exercise needs to be called).Call Id: [phone]

## 2016-05-25 NOTE — Telephone Encounter (Signed)
Please see 05/25/16 phone note.

## 2016-05-25 NOTE — Telephone Encounter (Signed)
Andrea Webster with [facility] transferred call to me; pt was advised to go to ED by [facility] and pt declined advice. Pt said 8 weeks ago had cholecystomy; since surgery pt has noticed a knot on incision;2 weeks ago pt noticed when pushes on incision can hear a noise like an air pocket. On 05/24/16 pt lifted a case of 35 bottles of water (pt not sure of wt of bottles) but since then pt has mild pain and swelling at incision line. No redness and pt does not have fever. Advised pt should contact surgeon and if condition changes or worsens should go to ED for eval. Pt voiced understanding and agreed with plan. FYI to Dr Damita Dunnings.

## 2016-05-25 NOTE — Telephone Encounter (Signed)
Noted. Thanks.

## 2016-05-26 ENCOUNTER — Telehealth: Payer: Self-pay | Admitting: Surgery

## 2016-05-26 NOTE — Telephone Encounter (Signed)
Returned phone call to patient at this time. She states that the "knot" under her umbilicus has not gone away since surgery and she picked up water this week and noticed that the area was more pronounced. States that she placed an icepack on the area and now the "knot" is gone. I informed patient that she may have this "knot" for up to 6 months as it may take this long for sutures to dissolve under the skin. If she notices that she still has this, after that timeframe she will need to be seen by one of our surgeons. This would be around November 10th. Patient verbalizes understanding of this conversation.  She was encouraged to call back with any other questions or concerns.

## 2016-05-26 NOTE — Telephone Encounter (Signed)
Patient had LAPAROSCOPIC CHOLECYSTECTOMY on May 10th with Dr Dahlia Byes. On Tuesday night she picked up a case of 35 water bottles. Shortly after a knot appeared above her navel and became tender. She would like to speak with someone to make sure everything is okay.

## 2016-07-20 ENCOUNTER — Telehealth: Payer: Self-pay | Admitting: Family Medicine

## 2016-07-20 NOTE — Telephone Encounter (Signed)
LVM to call back and schedule AWV for 2017

## 2016-08-05 ENCOUNTER — Ambulatory Visit (INDEPENDENT_AMBULATORY_CARE_PROVIDER_SITE_OTHER): Payer: Commercial Managed Care - HMO

## 2016-08-05 DIAGNOSIS — Z23 Encounter for immunization: Secondary | ICD-10-CM

## 2016-08-08 DIAGNOSIS — Z23 Encounter for immunization: Secondary | ICD-10-CM

## 2016-09-26 ENCOUNTER — Encounter: Payer: Self-pay | Admitting: Family Medicine

## 2016-09-26 ENCOUNTER — Ambulatory Visit (INDEPENDENT_AMBULATORY_CARE_PROVIDER_SITE_OTHER): Payer: Commercial Managed Care - HMO | Admitting: Family Medicine

## 2016-09-26 VITALS — BP 130/90 | HR 86 | Temp 98.4°F | Wt 225.8 lb

## 2016-09-26 DIAGNOSIS — F410 Panic disorder [episodic paroxysmal anxiety] without agoraphobia: Secondary | ICD-10-CM | POA: Diagnosis not present

## 2016-09-26 DIAGNOSIS — K439 Ventral hernia without obstruction or gangrene: Secondary | ICD-10-CM

## 2016-09-26 DIAGNOSIS — E785 Hyperlipidemia, unspecified: Secondary | ICD-10-CM

## 2016-09-26 LAB — HEPATIC FUNCTION PANEL
ALT: 58 U/L — ABNORMAL HIGH (ref 0–35)
AST: 37 U/L (ref 0–37)
Albumin: 4.5 g/dL (ref 3.5–5.2)
Alkaline Phosphatase: 86 U/L (ref 39–117)
BILIRUBIN DIRECT: 0.2 mg/dL (ref 0.0–0.3)
BILIRUBIN TOTAL: 0.8 mg/dL (ref 0.2–1.2)
Total Protein: 7 g/dL (ref 6.0–8.3)

## 2016-09-26 NOTE — Patient Instructions (Addendum)
Go to the lab on the way out.  We'll contact you with your lab report. We can see about simvastatin vs other meds after I get your report.   [name] will call about your referral. Take care.  Glad to see you.   You can call for a mammogram at [facility] at [facility].  Andrea Webster

## 2016-09-26 NOTE — Progress Notes (Signed)
She was intolerant of crestor 10mg .  Muscle aches.  Off statin now.  She was able to tolerate simvastatin. D/w pt about options.  D/w pt about rechecking LFTs today. We need to address that first.    "It still feels like I'm pregnant."  Surgery done 03/30/16.  abd mass can vary in size, gradually getting bigger.  It will "pooch out".  No FCNAVD.    Anxiety discussed with patient. She has failed multiple daily medications. Still on Xanax taking that daily. No adverse effect on medication. She has increased stressors in her life and was asking what can be done otherwise with medications. I want to consider options and I needed to see her liver tests first. See notes on labs. We specifically talked about not increasing her benzodiazepine dose.  PMH and SH reviewed  ROS: Per HPI unless specifically indicated in ROS section   Meds, vitals, and allergies reviewed.   GEN: nad, alert and oriented HEENT: mucous membranes moist NECK: supple w/o LA CV: rrr. PULM: ctab, no inc wob ABD: soft, +bs with either diastasis recti vs hernia noted, soft, obvious bulge that can be reduced on exam.  EXT: no edema SKIN: no acute rash

## 2016-09-26 NOTE — Progress Notes (Signed)
Pre visit review using our clinic review tool, if applicable. No additional management support is needed unless otherwise documented below in the visit note. 

## 2016-09-28 ENCOUNTER — Other Ambulatory Visit: Payer: Self-pay | Admitting: *Deleted

## 2016-09-28 ENCOUNTER — Encounter: Payer: Self-pay | Admitting: Family Medicine

## 2016-09-28 DIAGNOSIS — K439 Ventral hernia without obstruction or gangrene: Secondary | ICD-10-CM | POA: Insufficient documentation

## 2016-09-28 MED ORDER — VENLAFAXINE HCL 37.5 MG PO TABS: ORAL_TABLET | ORAL | 1 refills | Status: DC

## 2016-09-28 MED ORDER — SIMVASTATIN 20 MG PO TABS: 20.0000 mg | ORAL_TABLET | Freq: Every day | ORAL | 3 refills | Status: DC

## 2016-09-28 NOTE — Telephone Encounter (Signed)
Patient requested a refill on her Nitrostat.

## 2016-09-28 NOTE — Assessment & Plan Note (Signed)
Diastasis recti vs hernia.  Refer to gen surgery.  Anatomy d/w pt.

## 2016-09-28 NOTE — Assessment & Plan Note (Signed)
And anxiety. See notes on labs. I am trying to consider options for long-term treatment other than benzodiazepines. She has failed multiple medications previously. See orders.

## 2016-09-28 NOTE — Assessment & Plan Note (Signed)
See notes on labs. I may need to get cardiac input on options regarding statin. All of this is dependent on her having improved LFTs. She was intolerant of Crestor 10 mg. She was able to tolerate simvastatin.

## 2016-09-29 ENCOUNTER — Other Ambulatory Visit: Payer: Self-pay

## 2016-09-29 MED ORDER — NITROGLYCERIN 0.4 MG SL SUBL: 0.4000 mg | SUBLINGUAL_TABLET | SUBLINGUAL | 3 refills | Status: DC | PRN

## 2016-10-10 ENCOUNTER — Encounter: Payer: Self-pay | Admitting: Surgery

## 2016-10-10 ENCOUNTER — Ambulatory Visit (INDEPENDENT_AMBULATORY_CARE_PROVIDER_SITE_OTHER): Payer: Commercial Managed Care - HMO | Admitting: Surgery

## 2016-10-10 VITALS — BP 138/82 | HR 79 | Temp 98.1°F | Ht 60.0 in | Wt 225.0 lb

## 2016-10-10 DIAGNOSIS — K439 Ventral hernia without obstruction or gangrene: Secondary | ICD-10-CM

## 2016-10-10 NOTE — Patient Instructions (Signed)
We will send a referral to Dr. Duke Salvia at this time for Bariatric surgery evaluation and hernia. Their office will call you with any appointment as soon as they have a chance to review your chart.

## 2016-10-10 NOTE — Progress Notes (Signed)
Mrs. Dellarocco is  Following for an incisional hernia. He reports an obvious bulging of her abdominal wall where the supraumbilical incision from her previous laparoscopic cholecystectomy last. He did perform a laparoscopic cholecystectomy 6 months ago and was uneventful. She does have an hypertension and is morbidly obese. She denies any evidence of incarceration. Has some occasional intermittent mild dull pain around the hernia site. She is interested in losing weight and in weight loss surgery  PE NAd , obese Chest CTA, NSR  no murmurs Abd: soft, 3.5 cm supraumbilical defect, reducible No peritonitis Ext: no edema, well perfused  A/P a dramatic incisional hernia on a morbidly obese female who is interested in weight loss surgery. I discussed with her about options and I emphasized that in order to have the best outcome she should lose weight since obesity is a known risk factors for recurrence and complications after incisional hernia. Currently she is interested in weight loss surgery and we'll refer her to Dr. Duke Salvia. We will be happy to do a repair after she is optimized from her weight loss perspective She understands.

## 2016-10-11 ENCOUNTER — Telehealth: Payer: Self-pay | Admitting: Surgery

## 2016-10-11 NOTE — Telephone Encounter (Signed)
I have faxed a referral to Dr Bernette Mayers office for patient to be consulted for Bariatric Surgery. This was sent to attention to [facility] @ [phone].  I have contacted the patient to make her aware that the referral has been sent and to allow 5 business days for them to call to make her an appointment. She was also advised to go to [email or web address] to sign up for a online or in-person seminar. She was advised that this is required prior to being seen by Dr Duke Salvia. Patient understands all information.

## 2016-10-12 ENCOUNTER — Telehealth: Payer: Self-pay | Admitting: Family Medicine

## 2016-10-12 NOTE — Telephone Encounter (Signed)
[facility] transferred call to me; pt said last seen 09/26/16; pt taking venlafaxine 37.5 mg daily for about one week. Pt feels worse since taking med; pt more anxious,irritable,and feels like personality change;No SI/HI. Pt has not started taking venlafaxine bid yet and wants to know what to do. Dr Damita Dunnings said pt should stop Venlafaxine; if pt symptoms improve stay off med and call on 10/17/16 with update; if symptoms worsen after stopping med pt should restart venlafaxine 37.5 mg taking one daily again and call on 10/17/16 with update. If pt has SI or HI pt should go immediately to ED. Pt voiced understanding and will cb on 10/17/16 with update and give Dr Damita Dunnings further info about her hernia. FYI to Dr Damita Dunnings.

## 2016-10-12 NOTE — Telephone Encounter (Signed)
Thanks

## 2016-10-12 NOTE — Telephone Encounter (Signed)
[facility] Patient Name: Andrea Webster DOB: 1956/08/25 Initial Comment dr put her on new anti-depressant for 3 days, now her palms are sweaty, change in behavior-more angry, feels like jumping out of her skin Nurse Assessment Nurse: Martyn Ehrich, RN, Solmon Ice Date/Time (Eastern Time): 10/12/2016 3:15:04 PM Confirm and document reason for call. If symptomatic, describe symptoms. You must click the next button to save text entered. ---PT has been on a new antidepressant for about 7 d (Venlafaxine) and it is making her feel "like Im going to jump out of my skin and mean as a snake - I was even mean to people in the grocery store". This was a change in her personality. Does the patient have any new or worsening symptoms? ---Yes Will a triage be completed? ---Yes Related visit to physician within the last 2 weeks? ---NoDoes the PT have any chronic conditions? (i.e. diabetes, asthma, etc.) ---Yes List chronic conditions. ---she had a stent and gallbladder was removed and now has she has a hernia right above Office manager and Damita Dunnings, PCP wants her to go to surgeon Is this a behavioral health or substance abuse call? ---Yes Are you having any thoughts or feelings of harming or killing yourself or someone else? ---No Are you currently experiencing any physical discomfort that you think may be related to the use of alcohol or other drugs? (use substance abuse or alcohol abuse guidelines. These include withdrawal symptoms) ---No Do you worry that you may be hearing or seeing things that others do not? ---No Do you take medications for your condition(s)? ---Yes List medications here. ---venlafaxine, xanax prnGuidelines Guideline Title Affirmed Question Affirmed Notes Anxiety and Panic Attack [1] Started on anti-anxiety medication AND [2] no relief Hernia Hernia is painful or tender to touch Hernia Can't reduce  the hernia (NO pain, local tenderness, or vomiting) Final Disposition User See Physician within 24 Hours [place], RN, Solmon Ice Comments Monday she saw surgeon for herniaComments the general surgeon that did her gallbladder surgery she saw Mon wants her to go to bariatric surgeon bc she wants a sleeve and the surgeon she already saw said to go to bariatric surgery to do both. Not worse than when seen Mon. she says her tenderness at the site is because the general surgeon mashed on it heavily and so now it is a little sore it does not reduce - slightly bruised which she feels is due to exam THEN SHE SAID NO PAIN OR TENDERNESS TODAY IN [place]ERNIA Will call her in a minute and see if she wants UC options - Gershon Mussel is THanksgiving and conferenced her to office related to her Medication question - conferenced her to [facility] triage nurse in office Referrals [place] UNDECIDEDDisagree/Comply: Comply Call Id: [id number]

## 2016-11-01 DIAGNOSIS — K432 Incisional hernia without obstruction or gangrene: Secondary | ICD-10-CM | POA: Diagnosis not present

## 2016-11-01 DIAGNOSIS — R5381 Other malaise: Secondary | ICD-10-CM | POA: Diagnosis not present

## 2016-11-01 DIAGNOSIS — Z5181 Encounter for therapeutic drug level monitoring: Secondary | ICD-10-CM | POA: Diagnosis not present

## 2016-11-01 DIAGNOSIS — R638 Other symptoms and signs concerning food and fluid intake: Secondary | ICD-10-CM | POA: Diagnosis not present

## 2016-11-01 DIAGNOSIS — R5383 Other fatigue: Secondary | ICD-10-CM | POA: Diagnosis not present

## 2016-11-04 ENCOUNTER — Other Ambulatory Visit: Payer: Self-pay | Admitting: Family Medicine

## 2016-11-07 NOTE — Telephone Encounter (Signed)
Electronic refill request. Last Filled:   Zolpidem  90 tablet 1 05/18/2016  Last Filled:   Alprazolam 270 tablet 1 05/18/2016  Last office visit:   09/26/16   Please advise.

## 2016-11-08 NOTE — Telephone Encounter (Signed)
Faxed to [facility]

## 2016-11-08 NOTE — Telephone Encounter (Signed)
Printed.  Thanks.  

## 2016-11-16 ENCOUNTER — Ambulatory Visit (INDEPENDENT_AMBULATORY_CARE_PROVIDER_SITE_OTHER): Payer: Commercial Managed Care - HMO | Admitting: Physician Assistant

## 2016-11-16 ENCOUNTER — Encounter: Payer: Self-pay | Admitting: Physician Assistant

## 2016-11-16 VITALS — BP 140/68 | HR 79 | Ht 60.0 in | Wt 222.5 lb

## 2016-11-16 DIAGNOSIS — I2510 Atherosclerotic heart disease of native coronary artery without angina pectoris: Secondary | ICD-10-CM | POA: Diagnosis not present

## 2016-11-16 DIAGNOSIS — Z01810 Encounter for preprocedural cardiovascular examination: Secondary | ICD-10-CM

## 2016-11-16 DIAGNOSIS — Z6841 Body Mass Index (BMI) 40.0 and over, adult: Secondary | ICD-10-CM

## 2016-11-16 DIAGNOSIS — IMO0001 Reserved for inherently not codable concepts without codable children: Secondary | ICD-10-CM

## 2016-11-16 DIAGNOSIS — I358 Other nonrheumatic aortic valve disorders: Secondary | ICD-10-CM | POA: Diagnosis not present

## 2016-11-16 DIAGNOSIS — E782 Mixed hyperlipidemia: Secondary | ICD-10-CM

## 2016-11-16 DIAGNOSIS — E6609 Other obesity due to excess calories: Secondary | ICD-10-CM

## 2016-11-16 NOTE — Progress Notes (Signed)
Cardiology Office Note Date:  11/16/2016  Patient ID:  Anora, Schwenke 09/03/1956, MRN [id number] PCP:  Elsie Stain, MD  Cardiologist:  Dr. Fletcher Anon, MD    Chief Complaint: Follow up  History of Present Illness: MAKILAH DOWDA is a 60 y.o. female with history of CAD s/p MI at age 54 with PTCA performed of unknown vessel, strong family history of CAD, prior tobacco abuse quitting in 01/2015, HLD, HTN, GERD, and anxiety who presents for routine follow up of her CAD. Most recent cardiac cath in 04/2015 in the setting an intermediate risk stress test for chest pain showed a chronic total occlusion of the RCA, severe mid LCx disease with a 90% stenosis, and moderate proximal LAD disease. FFR was performed within the LAD stenosis and was found to not be significant at 0.83, thus no PCI was performed on the LAD. The LCx was felt to be the culprit vessel and was successfully treated with 2.75 x 15 mm Xience Alpine DES. Normal EF by noninvasive testing via stress test as above in 04/2015. She was last seen by Dr. Fletcher Anon in 02/2016 for follow up and was doing well at that time. Prior to that visit she had recently been seen in the ED for lower substeral chest pain radiating to her back. Cardaic workup with enzymes was negative. Her LFTs were abnormal, she underwent abdominal ultrasound which showed gallstones. She underwent successful cholecystectomy in 03/2016. Post operatively this was complicated by an insicional hernia. Repair was deferred at that time given her morbid obesity and desire for weight loss with plans for future repair. Most recently her LFTs on 11/6 showed improvement with normalization of alk phose, AST and mildly elevated ALT at 58 (previously 96-->78). She was restarted on simvastatin as she has previously tolerated this and did not tolerate Crestor 2/2 headaches or Lipitor possibly 2/2 elevated LFTs (though this may have been 2/2 gallbladder disease). Most recent LDL from 03/2016 was 74 on  Lipitor-->Crestor (did not tolerate)-->simvastatin.  She comes in today doing well. She has not had any chest pain or SOB. No decline in functional capacity. Able to function at > 4 METs. Tolerating all medications without issues. She is planning to have her hernia repaired when cleared with Dr. Darnell Level and is now starting the process to have a gastric sleeve placed down the road. BP at home has been running in the 130's/70's. She is under stress today as she was not aware of having an appointment with Dr. Darnell Level today and missed it.    Past Medical History:  Diagnosis Date  . Anxiety   . Arthritis   . Baker's cyst    x 2  . CAD (coronary artery disease)    a. s/p MI & PTCA;  b. Ex MV: basal inflat, mid inflat, apical inf, apical lat ischemia;  c. 04/2015 PCI: LM nl, LAD 60p (FFR 0.83), D1 60ost, LCX 50p, 12m(2.75x15 Xience Alpine DES), OM1/2/3 min irregs, RCA 109mTO.  . Depression    intolerant of paxil, wellbutrin, zoloft  . GERD (gastroesophageal reflux disease)   . Hayfever   . headaches   . Heart murmur   . High cholesterol   . Hypertension   . Insomnia   . Myocardial infarction   . RLS (restless legs syndrome)   . Sciatica   . Smoker    a. quit 01/2014.    Past Surgical History:  Procedure Laterality Date  . ANGIOPLASTY  1997  . APPENDECTOMY  1978   open  . [place]  . CARDIAC CATHETERIZATION N/A 04/30/2015   Procedure: Left Heart Cath;  Surgeon: Wellington Hampshire, MD;  Location: [place] [facility];  Service: Cardiovascular;  Laterality: N/A;  . CARPAL TUNNEL RELEASE    . CESAREAN SECTION  1980  . CESAREAN SECTION  1984  . CHOLECYSTECTOMY N/A 03/30/2016   Procedure: LAPAROSCOPIC CHOLECYSTECTOMY WITH INTRAOPERATIVE CHOLANGIOGRAM;  Surgeon: Jules Husbands, MD;  Location: [facility];  Service: General;  Laterality: N/A;  . [place]   [facility]  . VAGINAL HYSTERECTOMY      Current Outpatient Prescriptions  Medication Sig Dispense Refill  . ALPRAZolam (XANAX) 1 MG tablet TAKE 1 TABLET THREE TIMES DAILY AS NEEDED FOR ANXIETY 270 tablet 1  . aspirin 81 MG chewable tablet Chew 81 mg by mouth daily.    . fluticasone (FLONASE) 50 MCG/ACT nasal spray USE TWO SPRAY(S) IN EACH NOSTRIL ONCE DAILY 16 g 5  . isosorbide mononitrate (IMDUR) 60 MG 24 hr tablet TAKE 1 TABLET EVERY DAY 90 tablet 3  . metoprolol tartrate (LOPRESSOR) 25 MG tablet TAKE 1 TABLET TWICE DAILY 180 tablet 3  . Multiple Vitamin (MULTIVITAMIN) tablet Take 1 tablet by mouth daily.      . nitroGLYCERIN (NITROSTAT) 0.4 MG SL tablet Place 1 tablet (0.4 mg total) under the tongue every 5 (five) minutes as needed for chest pain. 25 tablet 3  . pantoprazole (PROTONIX) 40 MG tablet TAKE 1 TABLET EVERY DAY 90 tablet 3  . senna (SENOKOT) 8.6 MG tablet Take 1 tablet by mouth as needed for constipation.     . simvastatin (ZOCOR) 20 MG tablet Take 1 tablet (20 mg total) by mouth at bedtime. 90 tablet 3  . venlafaxine (EFFEXOR) 37.5 MG tablet 1 tablet day for 1 week then increase to 1 tab twice a day thereafter. 180 tablet 1  . VENTOLIN HFA 108 (90 Base) MCG/ACT inhaler INHALE 2 PUFFS EVERY 6  HOURS AS NEEDED FOR WHEEZING OR SHORTNESS OF BREATH. 54 g 1  . zolpidem (AMBIEN) 10 MG tablet TAKE 1 TABLET AT BEDTIME AS NEEDED FOR SLEEP 90 tablet 1   No current facility-administered medications for this visit.     Allergies:   Crestor [rosuvastatin calcium]; Paroxetine hcl; Sertraline hcl; and Wellbutrin [bupropion hcl]   Social History:  The patient  reports that she quit smoking about 21 months ago. Her smoking use included Cigarettes. She has a 6.60 pack-year smoking history. She has never used smokeless tobacco. She reports that she drinks alcohol. She reports that she does not use drugs.   Family History:  The patient's family history includes Alcohol abuse in  her father; Breast cancer in her maternal grandmother; Diabetes in her mother; Heart attack in her father and mother; Heart disease in her father and mother; Hypertension in her mother.  ROS:   Review of Systems  Constitutional: Negative for chills, diaphoresis, fever, malaise/fatigue and weight loss.  HENT: Negative for congestion.   Eyes: Negative for discharge and redness.  Respiratory: Negative for cough, hemoptysis, sputum production, shortness of breath and wheezing.   Cardiovascular: Negative for chest pain, palpitations, orthopnea, claudication, leg swelling and PND.  Gastrointestinal: Negative for abdominal pain, blood in stool, heartburn, melena, nausea and vomiting.  Genitourinary: Negative for hematuria.  Musculoskeletal: Negative for  falls and myalgias.  Skin: Negative for rash.  Neurological: Negative for dizziness, tingling, tremors, sensory change, speech change, focal weakness, loss of consciousness and weakness.  Endo/Heme/Allergies: Does not bruise/bleed easily.  Psychiatric/Behavioral: Negative for substance abuse. The patient is not nervous/anxious.   All other systems reviewed and are negative.    PHYSICAL EXAM:  VS:  BP 140/68 (BP Location: Left Arm, Patient Position: Sitting, Cuff Size: Large)   Pulse 79   Ht 5' (1.524 m)   Wt 222 lb 8 oz (100.9 kg)   BMI 43.45 kg/m  BMI: Body mass index is 43.45 kg/m.  Physical Exam  Constitutional: She is oriented to person, place, and time. She appears well-developed and well-nourished.  HENT:  Head: Normocephalic and atraumatic.  Eyes: Right eye exhibits no discharge. Left eye exhibits no discharge.  Neck: Normal range of motion. No JVD present.  Cardiovascular: Normal rate, regular rhythm, S1 normal, S2 normal and normal heart sounds.  Exam reveals no distant heart sounds, no friction rub, no midsystolic click and no opening snap.   No murmur heard. Pulmonary/Chest: Effort normal and breath sounds normal. No  respiratory distress. She has no decreased breath sounds. She has no wheezes. She has no rales. She exhibits no tenderness.  Abdominal: Soft. She exhibits mass. She exhibits no shifting dullness, no distension, no fluid wave, no ascites and no pulsatile midline mass. There is no tenderness. A hernia is present. Hernia confirmed positive in the ventral area.  Musculoskeletal: She exhibits no edema.  Neurological: She is alert and oriented to person, place, and time.  Skin: Skin is warm and dry. No cyanosis. Nails show no clubbing.  Psychiatric: She has a normal mood and affect. Her speech is normal and behavior is normal. Judgment and thought content normal.    EKG:  Was ordered and interpreted by me today. Shows NSR, 79 bpm, low voltage QRS, no acute st/t changes  Recent Labs: 03/13/2016: BUN 10; Creatinine, Ser 0.71; Hemoglobin 13.0; Platelets 240; Potassium 4.2; Sodium 136 09/26/2016: ALT 58  04/15/2016: Chol/HDL Ratio 3.2; Cholesterol, Total 157; HDL 49; LDL Calculated 74; Triglycerides 171   CrCl cannot be calculated (Patient's most recent lab result is older than the maximum 21 days allowed.).   Wt Readings from Last 3 Encounters:  11/16/16 222 lb 8 oz (100.9 kg)  10/10/16 225 lb (102.1 kg)  09/26/16 225 lb 12 oz (102.4 kg)     Other studies reviewed: Additional studies/records reviewed today include: summarized above  ASSESSMENT AND PLAN:  1. CAD: No symptoms concerning for angina at this time. Continue ASA 81 mg, Imdur 60 mg daily, Lopressor 25 mg bid, SL NTG prn (has not needed any), and simvastatin 20 mg daily. Schedule Lexiscan Myoview as below as part of her pre-operative evaluation.   2.   Pre-operative cardiac evaluation: See below.  PREOPERATIVE CARDIAC RISK ASSESSMENT:   Revised Cardiac Risk Index:  High Risk Surgery (defined as Intraperitoneal, intrathoracic or suprainguinal vascular): Yes; (ventral hernia repair and gastric sleeve)  CAD: Yes; (Prior MI,  Asymptomatic)  CHF: No  Cerebrovascular Disease: No   Diabetes: No; On Insulin: No  CKD (Cr >~ 2): No   Estimated Risk of Adverse Outcome: moderate risk for high-risk non-cardiac surgery. Estimated Risk of MI, PE, VF/VT (Cardiac Arrest), Complete Heart Block: 6.6 %   ACC/AHA Guidelines for "Clearance":  Step 1 - Need for Emergency Surgery: No   If Yes - go straight to OR with perioperative surveillance  Step 2 -  Active Cardiac Conditions (Unstable Angina, Decompensated HF, Significant  Arrhytmias - Complete HB,Mobitz II, Symptomatic VT or SVT, Severe Aortic Stenosis - mean gradient > 40 mmHg, Valve area < 1.0 cm2): No  If Yes - Evaluate & Treat per ACC/AHA Guidelines  Step 3 -  Low Risk Surgery: No, high risk  If Yes --> proceed to OR  If No --> Step 4  Step 4 - Functional Capacity >= 4 METS without symptoms: Yes  If Yes --> proceed to OR  If No --> Step 5  Step 5 --  Clinical Risk Factors (CRF)  - Zero --> proceed to OR   Will check Lexiscan Myoview prior to clearing for high-risk non-cardiac surgery  3.   HLD: Patient's LFT increased while Lipitor, though cannot rule out this being related to gallbladder alone or even a combination of both. Nonetheless, her liver function was improving at last check in early November. She is due for follow up lipid and liver at this time. If labs today indicate worsening of lipids with normal liver function could consider retrial of low-dose Lipitor with close follow up of liver function. For now continue simvastatin unless indicated by labs. Lifestyle modification.   4.   Aortic sclerosis murmur: Check echo as part of her pre-operative evaluation and per prior office visit.   5.   Obesity: Starting gastric sleeve process.   Disposition: F/u with Dr. Fletcher Anon, MD in 6 months.    Current medicines are reviewed at length with the patient today.  The patient did not have any concerns regarding medicines.  Melvern Banker  PA-C 11/16/2016 2:49 PM     [address], [place] [id number] [phone]

## 2016-11-16 NOTE — Patient Instructions (Signed)
Medication Instructions:  Please continue your current medications  Labwork: Liver,lipid  Testing/Procedures: Your physician has requested that you have an echocardiogram. Echocardiography is a painless test that uses sound waves to create images of your heart. It provides your doctor with information about the size and shape of your heart and how well your heart's chambers and valves are working. This procedure takes approximately one hour. There are no restrictions for this procedure.  [place]  Your caregiver has ordered a Stress Test with nuclear imaging. The purpose of this test is to evaluate the blood supply to your heart muscle. This procedure is referred to as a "Non-Invasive Stress Test." This is because other than having an IV started in your vein, nothing is inserted or "invades" your body. Cardiac stress tests are done to find areas of poor blood flow to the heart by determining the extent of coronary artery disease (CAD). Some patients exercise on a treadmill, which naturally increases the blood flow to your heart, while others who are  unable to walk on a treadmill due to physical limitations have a pharmacologic/chemical stress agent called Lexiscan . This medicine will mimic walking on a treadmill by temporarily increasing your coronary blood flow.   Please note: these test may take anywhere between 2-4 hours to complete  PLEASE REPORT TO [place] AT THE FIRST DESK WILL DIRECT YOU WHERE TO GO  Date of Procedure:_____Thursday, Dec 28_______  Arrival Time for Procedure:_____7:45 am________  Instructions regarding medication:   __X__:  Hold METOPROLOL the night before and morning of procedure  How to prepare for your Myoview test:  1. Do not eat or drink after midnight 2. No caffeine for 24 hours prior to test 3. No smoking 24 hours prior to test. 4. Your medication may be taken with water.  If your doctor stopped a medication because  of this test, do not take that medication. 5. Ladies, please do not wear dresses.  Skirts or pants are appropriate. Please wear a short sleeve shirt. 6. No perfume, cologne or lotion.   Follow-Up: Your physician wants you to follow-up in: 6 months w/ Dr. Zannie Cove will receive a reminder letter in the mail two months in advance. If you don't receive a letter, please call our office to schedule the follow-up appointment.   If you need a refill on your cardiac medications before your next appointment, please call your pharmacy.

## 2016-11-17 ENCOUNTER — Encounter: Payer: Self-pay | Admitting: Physician Assistant

## 2016-11-17 ENCOUNTER — Other Ambulatory Visit: Payer: Self-pay | Admitting: Physician Assistant

## 2016-11-17 ENCOUNTER — Encounter
Admission: RE | Admit: 2016-11-17 | Discharge: 2016-11-17 | Disposition: A | Discharge status: Home / Self Care | Payer: Commercial Managed Care - HMO | Source: Ambulatory Visit | Attending: Physician Assistant | Admitting: Physician Assistant

## 2016-11-17 DIAGNOSIS — E782 Mixed hyperlipidemia: Secondary | ICD-10-CM | POA: Diagnosis not present

## 2016-11-17 DIAGNOSIS — Z01810 Encounter for preprocedural cardiovascular examination: Secondary | ICD-10-CM

## 2016-11-17 DIAGNOSIS — I2510 Atherosclerotic heart disease of native coronary artery without angina pectoris: Secondary | ICD-10-CM | POA: Insufficient documentation

## 2016-11-17 DIAGNOSIS — I358 Other nonrheumatic aortic valve disorders: Secondary | ICD-10-CM

## 2016-11-17 LAB — LIPID PANEL
Chol/HDL Ratio: 3.8 ratio units (ref 0.0–4.4)
Cholesterol, Total: 206 mg/dL — ABNORMAL HIGH (ref 100–199)
HDL: 54 mg/dL (ref >39)
LDL Calculated: 118 mg/dL — ABNORMAL HIGH (ref 0–99)
TRIGLYCERIDES: 169 mg/dL — AB (ref 0–149)
VLDL CHOLESTEROL CAL: 34 mg/dL (ref 5–40)

## 2016-11-17 LAB — NM MYOCAR MULTI W/SPECT W/WALL MOTION / EF
CHL CUP NUCLEAR SRS: 13
CHL CUP RESTING HR STRESS: 75 {beats}/min
LV dias vol: 49 mL (ref 46–106)
LV sys vol: 15 mL
NUC STRESS TID: 0.75
Peak HR: 105 {beats}/min
Percent HR: 65 %
SDS: 7
SSS: 18

## 2016-11-17 LAB — HEPATIC FUNCTION PANEL
ALBUMIN: 4.7 g/dL (ref 3.5–5.5)
ALT: 47 IU/L — ABNORMAL HIGH (ref 0–32)
AST: 43 IU/L — AB (ref 0–40)
Alkaline Phosphatase: 92 IU/L (ref 39–117)
BILIRUBIN TOTAL: 0.8 mg/dL (ref 0.0–1.2)
Bilirubin, Direct: 0.23 mg/dL (ref 0.00–0.40)
TOTAL PROTEIN: 6.7 g/dL (ref 6.0–8.5)

## 2016-11-17 MED ORDER — TECHNETIUM TC 99M TETROFOSMIN IV KIT
32.9300 | PACK | Freq: Once | INTRAVENOUS | Status: AC | PRN
  Administered 2016-11-17: 32.93 via INTRAVENOUS

## 2016-11-17 MED ORDER — TECHNETIUM TC 99M TETROFOSMIN IV KIT
13.4500 | PACK | Freq: Once | INTRAVENOUS | Status: AC | PRN
  Administered 2016-11-17: 13.45 via INTRAVENOUS

## 2016-11-17 MED ORDER — REGADENOSON 0.4 MG/5ML IV SOLN
0.4000 mg | Freq: Once | INTRAVENOUS | Status: AC
  Administered 2016-11-17: 0.4 mg via INTRAVENOUS

## 2016-11-17 NOTE — Progress Notes (Signed)
Note for surgical clearance.

## 2016-11-18 ENCOUNTER — Telehealth: Payer: Self-pay | Admitting: *Deleted

## 2016-11-18 NOTE — Telephone Encounter (Signed)
-----   Message from Rise Mu, PA-C sent at 11/17/2016  4:31 PM EST ----- Please call the patient. Patient asymptomatic. Discussed with Dr. Rockey Situ. Ok to proceed with surgery. No need for cardiac cath. Letter typed up and signed. Placed at [employer].

## 2016-11-18 NOTE — Telephone Encounter (Signed)
Results called to pt. Pt verbalized understanding. Letter from Christell Faith, [place] faxed to [facility]- Dr Kreg Shropshire at [phone].

## 2016-11-21 HISTORY — PX: HERNIA REPAIR: SHX51

## 2016-11-24 ENCOUNTER — Ambulatory Visit: Payer: Commercial Managed Care - HMO

## 2016-11-24 ENCOUNTER — Encounter: Payer: Self-pay | Admitting: Physician Assistant

## 2016-11-24 NOTE — Progress Notes (Deleted)
Patient ID: Andrea Webster                 DOB: 12/02/1955                    MRN: [id number]     HPI: Andrea Webster is a 61 y.o. female patient of Dr Fletcher Anon referred to lipid clinic by Christell Faith, PA. PMH is significant for CAD s/p MI at age 64 with PTCA, strong family history of CAD, prior tobacco abuse quit in 01/2015, HLD, HTN, GERD, obesity, and anxiety. Cardiac cath in 04/2015 showed chronic total occlusion of RCA, severe mid LCx disease with 90% stenosis treated with DES, and moderate proximal LAD disease. Pt has hx of elevated LFTs which have improved since switching from Lipitor to simvastatin. Pt did not tolerate Crestor due to myalgias. Of note, pt did have gallbladder removed in May 2017 and ultrasound showed fatty liver.  Pt tolerating simva? Try zetia?  New ins card? Pt has Medicare  Current Medications: simvastatin 20mg  daily Intolerances: Crestor 10mg  (May - Nov 2017) - myalgias, Lipitor 40mg  and 80mg  (2016-2017) - LFT increase Risk Factors: CAD s/p MI at age 66 LDL goal: 70mg /dL  Diet:   Exercise:   Family History: The patient's family history includes Alcohol abuse in her father; Breast cancer in her maternal grandmother; Diabetes in her mother; Heart attack in her father and mother; Heart disease in her father and mother; Hypertension in her mother.  Social History: The patient  reports that she quit smoking about 21 months ago. Her smoking use included Cigarettes. She has a 6.60 pack-year smoking history. She has never used smokeless tobacco. She reports that she drinks alcohol. She reports that she does not use drugs.   Labs: 11/16/16: TC 206, TG 169, HDL 54, LDL 118, AST 43,ALT 47 (both improved from 7 and 8 months ago - pt took Crestor at that time, now on simvastatin 20)  Past Medical History:  Diagnosis Date  . Anxiety   . Arthritis   . Baker's cyst    x 2  . CAD (coronary artery disease)    a. s/p MI & PTCA;  b. Ex MV: basal inflat, mid inflat, apical inf,  apical lat ischemia;  c. 04/2015 PCI: LM nl, LAD 60p (FFR 0.83), D1 60ost, LCX 50p, 84m (2.75x15 Xience Alpine DES), OM1/2/3 min irregs, RCA 168m CTO.  . Depression    intolerant of paxil, wellbutrin, zoloft  . GERD (gastroesophageal reflux disease)   . Hayfever   . headaches   . Heart murmur   . High cholesterol   . Hypertension   . Insomnia   . Myocardial infarction   . RLS (restless legs syndrome)   . Sciatica   . Smoker    a. quit 01/2014.    Current Outpatient Prescriptions on File Prior to Visit  Medication Sig Dispense Refill  . ALPRAZolam (XANAX) 1 MG tablet TAKE 1 TABLET THREE TIMES DAILY AS NEEDED FOR ANXIETY 270 tablet 1  . aspirin 81 MG chewable tablet Chew 81 mg by mouth daily.    . fluticasone (FLONASE) 50 MCG/ACT nasal spray USE TWO SPRAY(S) IN EACH NOSTRIL ONCE DAILY 16 g 5  . isosorbide mononitrate (IMDUR) 60 MG 24 hr tablet TAKE 1 TABLET EVERY DAY 90 tablet 3  . metoprolol tartrate (LOPRESSOR) 25 MG tablet TAKE 1 TABLET TWICE DAILY 180 tablet 3  . Multiple Vitamin (MULTIVITAMIN) tablet Take 1 tablet by mouth daily.      Marland Kitchen  nitroGLYCERIN (NITROSTAT) 0.4 MG SL tablet Place 1 tablet (0.4 mg total) under the tongue every 5 (five) minutes as needed for chest pain. 25 tablet 3  . pantoprazole (PROTONIX) 40 MG tablet TAKE 1 TABLET EVERY DAY 90 tablet 3  . senna (SENOKOT) 8.6 MG tablet Take 1 tablet by mouth as needed for constipation.     . simvastatin (ZOCOR) 20 MG tablet Take 1 tablet (20 mg total) by mouth at bedtime. 90 tablet 3  . venlafaxine (EFFEXOR) 37.5 MG tablet 1 tablet day for 1 week then increase to 1 tab twice a day thereafter. 180 tablet 1  . VENTOLIN HFA 108 (90 Base) MCG/ACT inhaler INHALE 2 PUFFS EVERY 6  HOURS AS NEEDED FOR WHEEZING OR SHORTNESS OF BREATH. 54 g 1  . zolpidem (AMBIEN) 10 MG tablet TAKE 1 TABLET AT BEDTIME AS NEEDED FOR SLEEP 90 tablet 1   No current facility-administered medications on file prior to visit.     Allergies  Allergen  Reactions  . Crestor [Rosuvastatin Calcium] Other (See Comments)    aches  . Paroxetine Hcl   . Sertraline Hcl   . Wellbutrin [Bupropion Hcl]     Assessment/Plan:

## 2016-11-25 DIAGNOSIS — Z5181 Encounter for therapeutic drug level monitoring: Secondary | ICD-10-CM | POA: Diagnosis not present

## 2016-11-29 DIAGNOSIS — K432 Incisional hernia without obstruction or gangrene: Secondary | ICD-10-CM | POA: Diagnosis not present

## 2016-11-30 ENCOUNTER — Ambulatory Visit: Payer: Commercial Managed Care - HMO

## 2016-12-01 ENCOUNTER — Ambulatory Visit (INDEPENDENT_AMBULATORY_CARE_PROVIDER_SITE_OTHER): Payer: Medicare HMO

## 2016-12-01 ENCOUNTER — Other Ambulatory Visit: Payer: Self-pay

## 2016-12-01 DIAGNOSIS — Z01810 Encounter for preprocedural cardiovascular examination: Secondary | ICD-10-CM

## 2016-12-01 DIAGNOSIS — I2510 Atherosclerotic heart disease of native coronary artery without angina pectoris: Secondary | ICD-10-CM

## 2016-12-01 DIAGNOSIS — I358 Other nonrheumatic aortic valve disorders: Secondary | ICD-10-CM | POA: Diagnosis not present

## 2016-12-02 ENCOUNTER — Telehealth: Payer: Self-pay | Admitting: Physician Assistant

## 2016-12-02 NOTE — Telephone Encounter (Signed)
Pt called back to review echo results. Reviewed information w/pt.  See results note

## 2016-12-02 NOTE — Telephone Encounter (Signed)
This encounter was created in error - please disregard.

## 2016-12-03 ENCOUNTER — Other Ambulatory Visit: Payer: Self-pay | Admitting: Cardiovascular Disease

## 2016-12-06 ENCOUNTER — Ambulatory Visit: Payer: Commercial Managed Care - HMO

## 2016-12-06 NOTE — Progress Notes (Deleted)
Patient ID: Andrea Webster                 DOB: Apr 20, 1956                    MRN: [id number]     HPI: Andrea Webster is a 61 y.o. female patient of Dr Andrea Webster referred to lipid clinic by Andrea Faith, PA. PMH is significant for CAD s/p MI at age 65 with PTCA performed to unknown vessel, unstable angina, cardiac cath in June 2016 with chronic total occlusion of RCA, severe mid LCx disease with 90% stenosis, and moderate proximal LAD disease s/p PCI to LAD to LCx. LFTs have been previously elevated due to gallstones.  Zetia, [id number]?  Has [employer] medicare...  Current Medications: Lipitor 80mg  daily Intolerances: Crestor - myalgias Risk Factors: CAD s/p MI at age 89, unstable angina, HLD, former smoker, family history LDL goal: 70mg /dL  Diet:   Exercise:   Family History: The patient's family history includes Alcohol abuse in her father; Breast cancer in her maternal grandmother; Diabetes in her mother; Heart attack in her father and mother; Heart disease in her father and mother; Hypertension in her mother. There is no history of Colon cancer.   Social History: Former smoker. Her smoking use included Cigarettes. She has a 6.6 pack-year smoking history. She has never used smokeless tobacco. She reports that she drinks alcohol. She reports that she does not use illicit drugs.   Labs: 11/16/16: TC 206, TG 169, HDL 54, LDL 118  Past Medical History:  Diagnosis Date  . Anxiety   . Arthritis   . Baker's cyst    x 2  . CAD (coronary artery disease)    a. s/p MI & PTCA;  b. Ex MV: basal inflat, mid inflat, apical inf, apical lat ischemia;  c. 04/2015 PCI: LM nl, LAD 60p (FFR 0.83), D1 60ost, LCX 50p, 44m (2.75x15 Xience Alpine DES), OM1/2/3 min irregs, RCA 169m CTO.  . Depression    intolerant of paxil, wellbutrin, zoloft  . GERD (gastroesophageal reflux disease)   . Hayfever   . headaches   . Heart murmur   . High cholesterol   . Hypertension   . Insomnia   . Myocardial infarction   .  RLS (restless legs syndrome)   . Sciatica   . Smoker    a. quit 01/2014.    Current Outpatient Prescriptions on File Prior to Visit  Medication Sig Dispense Refill  . ALPRAZolam (XANAX) 1 MG tablet TAKE 1 TABLET THREE TIMES DAILY AS NEEDED FOR ANXIETY 270 tablet 1  . aspirin 81 MG chewable tablet Chew 81 mg by mouth daily.    . fluticasone (FLONASE) 50 MCG/ACT nasal spray USE TWO SPRAY(S) IN EACH NOSTRIL ONCE DAILY 16 g 5  . isosorbide mononitrate (IMDUR) 60 MG 24 hr tablet TAKE 1 TABLET EVERY DAY 90 tablet 3  . metoprolol tartrate (LOPRESSOR) 25 MG tablet TAKE 1 TABLET TWICE DAILY 180 tablet 3  . Multiple Vitamin (MULTIVITAMIN) tablet Take 1 tablet by mouth daily.      . nitroGLYCERIN (NITROSTAT) 0.4 MG SL tablet Place 1 tablet (0.4 mg total) under the tongue every 5 (five) minutes as needed for chest pain. 25 tablet 3  . pantoprazole (PROTONIX) 40 MG tablet TAKE 1 TABLET EVERY DAY 90 tablet 3  . senna (SENOKOT) 8.6 MG tablet Take 1 tablet by mouth as needed for constipation.     . simvastatin (ZOCOR) 20 MG tablet  Take 1 tablet (20 mg total) by mouth at bedtime. 90 tablet 3  . venlafaxine (EFFEXOR) 37.5 MG tablet 1 tablet day for 1 week then increase to 1 tab twice a day thereafter. 180 tablet 1  . VENTOLIN HFA 108 (90 Base) MCG/ACT inhaler INHALE 2 PUFFS EVERY 6  HOURS AS NEEDED FOR WHEEZING OR SHORTNESS OF BREATH. 54 g 1  . zolpidem (AMBIEN) 10 MG tablet TAKE 1 TABLET AT BEDTIME AS NEEDED FOR SLEEP 90 tablet 1   No current facility-administered medications on file prior to visit.     Allergies  Allergen Reactions  . Crestor [Rosuvastatin Calcium] Other (See Comments)    aches  . Paroxetine Hcl   . Sertraline Hcl   . Wellbutrin [Bupropion Hcl]     Assessment/Plan:

## 2016-12-12 ENCOUNTER — Telehealth: Payer: Self-pay | Admitting: Physician Assistant

## 2016-12-12 ENCOUNTER — Other Ambulatory Visit: Payer: Self-pay | Admitting: Family Medicine

## 2016-12-12 ENCOUNTER — Other Ambulatory Visit: Payer: Self-pay | Admitting: Primary Care

## 2016-12-12 DIAGNOSIS — R059 Cough, unspecified: Secondary | ICD-10-CM

## 2016-12-12 DIAGNOSIS — R05 Cough: Secondary | ICD-10-CM

## 2016-12-12 NOTE — Telephone Encounter (Signed)
Sent.   If needed, get scheduled for eval re: cough prior to surgery.  Please advise her to notify surgeon about the cough, if she hasn't already.  Thanks.

## 2016-12-12 NOTE — Telephone Encounter (Signed)
Cardiac clearance form received from [facility]. Spoke with Christell Faith PA and letter was sent to them back on 11/17/16 for clearance. Patient is only on Aspirin 81 mg once daily and he states this should be continued. Will call them tomorrow to see if they need anything else.

## 2016-12-12 NOTE — Telephone Encounter (Signed)
Pt left v/m requesting refill benzonatate to [facility] [address]. Pt is scheduled for hernia surgery on 10/19/17; when pt lays on back she coughs; pt request cb. Last seen 09/26/16.Please advise.

## 2016-12-13 NOTE — Telephone Encounter (Signed)
Called [facility] and let them know that letter was sent back in December. Secretary Elmyra Ricks entered note with instructions to call back if any further information is needed.

## 2016-12-13 NOTE — Telephone Encounter (Signed)
Patient notified as instructed by telephone and verbalized understanding. Patient stated that she is scheduled for surgery Monday. Patient stated that she does not feel that she needs an appointment to be seen, but will call back if any thing changes. Patient stated that she has informed her surgeon of her cough.

## 2016-12-14 DIAGNOSIS — F4322 Adjustment disorder with anxiety: Secondary | ICD-10-CM | POA: Diagnosis not present

## 2016-12-16 DIAGNOSIS — F4322 Adjustment disorder with anxiety: Secondary | ICD-10-CM | POA: Diagnosis not present

## 2016-12-19 DIAGNOSIS — I2510 Atherosclerotic heart disease of native coronary artery without angina pectoris: Secondary | ICD-10-CM | POA: Diagnosis not present

## 2016-12-19 DIAGNOSIS — Z87891 Personal history of nicotine dependence: Secondary | ICD-10-CM | POA: Diagnosis not present

## 2016-12-19 DIAGNOSIS — K439 Ventral hernia without obstruction or gangrene: Secondary | ICD-10-CM | POA: Diagnosis not present

## 2016-12-19 DIAGNOSIS — I25810 Atherosclerosis of coronary artery bypass graft(s) without angina pectoris: Secondary | ICD-10-CM | POA: Diagnosis not present

## 2016-12-19 DIAGNOSIS — Z7982 Long term (current) use of aspirin: Secondary | ICD-10-CM | POA: Diagnosis not present

## 2016-12-19 DIAGNOSIS — I10 Essential (primary) hypertension: Secondary | ICD-10-CM | POA: Diagnosis not present

## 2016-12-19 DIAGNOSIS — K432 Incisional hernia without obstruction or gangrene: Secondary | ICD-10-CM | POA: Diagnosis not present

## 2016-12-19 DIAGNOSIS — I252 Old myocardial infarction: Secondary | ICD-10-CM | POA: Diagnosis not present

## 2016-12-20 DIAGNOSIS — I2510 Atherosclerotic heart disease of native coronary artery without angina pectoris: Secondary | ICD-10-CM | POA: Diagnosis not present

## 2016-12-20 DIAGNOSIS — Z87891 Personal history of nicotine dependence: Secondary | ICD-10-CM | POA: Diagnosis not present

## 2016-12-20 DIAGNOSIS — K432 Incisional hernia without obstruction or gangrene: Secondary | ICD-10-CM | POA: Diagnosis not present

## 2016-12-20 DIAGNOSIS — Z7982 Long term (current) use of aspirin: Secondary | ICD-10-CM | POA: Diagnosis not present

## 2016-12-20 DIAGNOSIS — I10 Essential (primary) hypertension: Secondary | ICD-10-CM | POA: Diagnosis not present

## 2016-12-20 DIAGNOSIS — I252 Old myocardial infarction: Secondary | ICD-10-CM | POA: Diagnosis not present

## 2016-12-23 NOTE — Telephone Encounter (Signed)
1 

## 2017-01-20 ENCOUNTER — Telehealth: Payer: Self-pay | Admitting: Family Medicine

## 2017-01-20 NOTE — Telephone Encounter (Signed)
Scheduled 01/23/17

## 2017-01-20 NOTE — Telephone Encounter (Signed)
LVM for pt to call back and schedule AWV + labs with Lesia and CPE with PCP. °

## 2017-01-23 ENCOUNTER — Ambulatory Visit (INDEPENDENT_AMBULATORY_CARE_PROVIDER_SITE_OTHER): Payer: Medicare HMO

## 2017-01-23 ENCOUNTER — Other Ambulatory Visit: Payer: Self-pay | Admitting: Family Medicine

## 2017-01-23 VITALS — BP 140/90 | HR 76 | Temp 98.2°F | Ht 59.5 in | Wt 218.8 lb

## 2017-01-23 DIAGNOSIS — Z0000 Encounter for general adult medical examination without abnormal findings: Secondary | ICD-10-CM | POA: Diagnosis not present

## 2017-01-23 DIAGNOSIS — Z119 Encounter for screening for infectious and parasitic diseases, unspecified: Secondary | ICD-10-CM | POA: Diagnosis not present

## 2017-01-23 DIAGNOSIS — E785 Hyperlipidemia, unspecified: Secondary | ICD-10-CM

## 2017-01-23 LAB — COMPREHENSIVE METABOLIC PANEL
ALBUMIN: 4.6 g/dL (ref 3.5–5.2)
ALT: 39 U/L — AB (ref 0–35)
AST: 29 U/L (ref 0–37)
Alkaline Phosphatase: 93 U/L (ref 39–117)
BILIRUBIN TOTAL: 0.8 mg/dL (ref 0.2–1.2)
BUN: 11 mg/dL (ref 6–23)
CO2: 29 mEq/L (ref 19–32)
CREATININE: 0.72 mg/dL (ref 0.40–1.20)
Calcium: 9.8 mg/dL (ref 8.4–10.5)
Chloride: 101 mEq/L (ref 96–112)
GFR: 88.02 mL/min (ref >60.00)
Glucose, Bld: 109 mg/dL — ABNORMAL HIGH (ref 70–99)
Potassium: 4.3 mEq/L (ref 3.5–5.1)
Sodium: 137 mEq/L (ref 135–145)
Total Protein: 7.4 g/dL (ref 6.0–8.3)

## 2017-01-23 NOTE — Progress Notes (Signed)
Subjective:   Andrea Webster is a 61 y.o. female who presents for Medicare Annual (Subsequent) preventive examination.  Review of Systems:  N/A Cardiac Risk Factors include: advanced age (>71men, >59 women);obesity (BMI >30kg/m2);dyslipidemia;hypertension     Objective:     Vitals: BP 140/90 (BP Location: Left Arm, Patient Position: Sitting, Cuff Size: Large)   Pulse 76   Temp 98.2 F (36.8 C) (Oral)   Ht 4' 11.5" (1.511 m) Comment: no shoes  Wt 218 lb 12 oz (99.2 kg)   SpO2 95%   BMI 43.44 kg/m   Body mass index is 43.44 kg/m.   Tobacco History  Smoking Status  . Former Smoker  . Packs/day: 0.33  . Years: 20.00  . Types: Cigarettes  . Quit date: 02/13/2015  Smokeless Tobacco  . Never Used     Counseling given: No   Past Medical History:  Diagnosis Date  . Anxiety   . Arthritis   . Baker's cyst    x 2  . CAD (coronary artery disease)    a. s/p MI & PTCA;  b. Ex MV: basal inflat, mid inflat, apical inf, apical lat ischemia;  c. 04/2015 PCI: LM nl, LAD 60p (FFR 0.83), D1 60ost, LCX 50p, 31m (2.75x15 Xience Alpine DES), OM1/2/3 min irregs, RCA 137m CTO.  . Depression    intolerant of paxil, wellbutrin, zoloft  . GERD (gastroesophageal reflux disease)   . Hayfever   . headaches   . Heart murmur   . High cholesterol   . Hypertension   . Insomnia   . Myocardial infarction   . RLS (restless legs syndrome)   . Sciatica   . Smoker    a. quit 01/2014.   Past Surgical History:  Procedure Laterality Date  . ANGIOPLASTY  1997  . APPENDECTOMY  1978   open  . [place]  . CARDIAC CATHETERIZATION N/A 04/30/2015   Procedure: Left Heart Cath;  Surgeon: Wellington Hampshire, MD;  Location: [facility];  Service: Cardiovascular;  Laterality: N/A;  . CARPAL TUNNEL RELEASE    . CESAREAN SECTION  1980  . CESAREAN SECTION  1984  . CHOLECYSTECTOMY N/A 03/30/2016   Procedure: LAPAROSCOPIC CHOLECYSTECTOMY WITH  INTRAOPERATIVE CHOLANGIOGRAM;  Surgeon: Jules Husbands, MD;  Location: [facility];  Service: General;  Laterality: N/A;  . [place]  [facility]  . VAGINAL HYSTERECTOMY     Family History  Problem Relation Age of Onset  . Heart disease Mother   . Diabetes Mother   . Hypertension Mother   . Heart attack Mother   . Alcohol abuse Father   . Heart disease Father   . Heart attack Father   . Breast cancer Maternal Grandmother   . Colon cancer Neg Hx    History  Sexual Activity  . Sexual activity: Not on file    Outpatient Encounter Prescriptions as of 01/23/2017  Medication Sig  . ALPRAZolam (XANAX) 1 MG tablet TAKE 1 TABLET THREE TIMES DAILY AS NEEDED FOR ANXIETY  . aspirin 81 MG chewable tablet Chew 81 mg by mouth daily.  . benzonatate (TESSALON) 200 MG capsule TAKE ONE CAPSULE BY MOUTH THREE TIMES DAILY AS NEEDED  . fluticasone (FLONASE) 50 MCG/ACT nasal spray USE TWO SPRAY(S) IN EACH NOSTRIL ONCE DAILY  . isosorbide mononitrate (IMDUR) 60  MG 24 hr tablet TAKE 1 TABLET EVERY DAY  . metoprolol tartrate (LOPRESSOR) 25 MG tablet TAKE 1 TABLET TWICE DAILY  . Multiple Vitamin (MULTIVITAMIN) tablet Take 1 tablet by mouth daily.    . nitroGLYCERIN (NITROSTAT) 0.4 MG SL tablet Place 1 tablet (0.4 mg total) under the tongue every 5 (five) minutes as needed for chest pain.  . pantoprazole (PROTONIX) 40 MG tablet TAKE 1 TABLET EVERY DAY  . senna (SENOKOT) 8.6 MG tablet Take 1 tablet by mouth as needed for constipation.   . simvastatin (ZOCOR) 20 MG tablet Take 1 tablet (20 mg total) by mouth at bedtime.  . VENTOLIN HFA 108 (90 Base) MCG/ACT inhaler INHALE 2 PUFFS EVERY 6  HOURS AS NEEDED FOR WHEEZING OR SHORTNESS OF BREATH.  Marland Kitchen zolpidem (AMBIEN) 10 MG tablet TAKE 1 TABLET AT BEDTIME AS NEEDED FOR SLEEP  . venlafaxine (EFFEXOR) 37.5 MG tablet 1 tablet day for 1 week then increase to 1 tab twice  a day thereafter. (Patient not taking: Reported on 01/23/2017)   No facility-administered encounter medications on file as of 01/23/2017.     Activities of Daily Living In your present state of health, do you have any difficulty performing the following activities: 01/23/2017 03/24/2016  Hearing? N N  Vision? N N  Difficulty concentrating or making decisions? N N  Walking or climbing stairs? N N  Dressing or bathing? N N  Doing errands, shopping? N N  Preparing Food and eating ? N -  Using the Toilet? N -  In the past six months, have you accidently leaked urine? Y -  Do you have problems with loss of bowel control? N -  Managing your Medications? N -  Managing your Finances? N -  Housekeeping or managing your Housekeeping? N -  Some recent data might be hidden    Patient Care Team: Tonia Ghent, MD as PCP - General (Family Medicine) Wellington Hampshire, MD as Consulting Physician (Cardiology) Bonner Puna, MD as Referring Physician [facility])    Assessment:     Hearing Screening   125Hz  250Hz  500Hz  1000Hz  2000Hz  3000Hz  4000Hz  6000Hz  8000Hz   Right ear:   40 40 40  40    Left ear:   40 40 40  40      Visual Acuity Screening   Right eye Left eye Both eyes  Without correction:     With correction: 20/40 20/30 20/30     Exercise Activities and Dietary recommendations Current Exercise Habits: Home exercise routine, Type of exercise: walking, Time (Minutes): 30, Frequency (Times/Week): 7, Weekly Exercise (Minutes/Week): 210, Intensity: Moderate, Exercise limited by: None identified  Goals    . Increase physical activity          Starting 01/23/17, I will continue to walk for 30 min daily.       Fall Risk Fall Risk  01/23/2017 06/30/2014  Falls in the past year? No Yes   Depression Screen PHQ 2/9 Scores 01/23/2017 06/30/2014  PHQ - 2 Score 0 0     Cognitive Function MMSE - Mini Mental State Exam 01/23/2017  Orientation to time 5  Orientation to Place 5  Registration 3    Attention/ Calculation 0  Recall 3  Language- name 2 objects 0  Language- repeat 1  Language- follow 3 step command 3  Language- read & follow direction 0  Write a sentence 0  Copy design 0  Total score 20     PLEASE NOTE: A Mini-Cog screen was  completed. Maximum score is 20. A value of 0 denotes this part of [place] MMSE was not completed or the patient failed this part of the Mini-Cog screening.   Mini-Cog Screening Orientation to Time - Max 5 pts Orientation to Place - Max 5 pts Registration - Max 3 pts Recall - Max 3 pts Language Repeat - Max 1 pts Language Follow 3 Step Command - Max 3 pts     Immunization History  Administered Date(s) Administered  . Influenza Split 11/01/2011  . Influenza, Seasonal, Injecte, Preservative Fre 11/23/2012  . Influenza,inj,Quad PF,36+ Mos 08/29/2013, 10/03/2014, 08/26/2015, 08/08/2016  . Pneumococcal Polysaccharide-23 08/26/2015  . Tdap 04/11/2013   Screening Tests Health Maintenance  Topic Date Due  . MAMMOGRAM  04/24/2017 (Originally 04/26/2015)  . COLONOSCOPY  09/18/2017 (Originally 09/20/2007)  . TETANUS/TDAP  04/12/2023  . INFLUENZA VACCINE  Completed  . Hepatitis C Screening  Completed  . HIV Screening  Completed      Plan:     I have personally reviewed and addressed the Medicare Annual Wellness questionnaire and have noted the following in the patient's chart:  A. Medical and social history B. Use of alcohol, tobacco or illicit drugs  C. Current medications and supplements D. Functional ability and status E.  Nutritional status F.  Physical activity G. Advance directives H. List of other physicians I.  Hospitalizations, surgeries, and ER visits in previous 12 months J.  [place] to include hearing, vision, cognitive, depression L. Referrals and appointments - none  In addition, I have reviewed and discussed with patient certain preventive protocols, quality metrics, and best practice recommendations. A  written personalized care plan for preventive services as well as general preventive health recommendations were provided to patient.  See attached scanned questionnaire for additional information.   Signed,   Lindell Noe, MHA, BS, LPN Health Coach

## 2017-01-23 NOTE — Patient Instructions (Addendum)
Ms. Nethercutt , Thank you for taking time to come for your Medicare Wellness Visit. I appreciate your ongoing commitment to your health goals. Please review the following plan we discussed and let me know if I can assist you in the future.   These are the goals we discussed: Goals    . Increase physical activity          Starting 01/23/17, I will continue to walk for 30 min daily.        This is a list of the screening recommended for you and due dates:  Health Maintenance  Topic Date Due  . Mammogram  04/24/2017*  . Colon Cancer Screening  09/18/2017*  . Tetanus Vaccine  04/12/2023  . Flu Shot  Completed  .  Hepatitis C: One time screening is recommended by Center for Disease Control  (CDC) for  adults born from [birth year] through 1964.   Completed  . HIV Screening  Completed  *Topic was postponed. The date shown is not the original due date.    Preventive Care for Adults  A healthy lifestyle and preventive care can promote health and wellness. Preventive health guidelines for adults include the following key practices.  . A routine yearly physical is a good way to check with your health care provider about your health and preventive screening. It is a chance to share any concerns and updates on your health and to receive a thorough exam.  . Visit your dentist for a routine exam and preventive care every 6 months. Brush your teeth twice a day and floss once a day. Good oral hygiene prevents tooth decay and gum disease.  . The frequency of eye exams is based on your age, health, family medical history, use  of contact lenses, and other factors. Follow your health care provider's ecommendations for frequency of eye exams.  . Eat a healthy diet. Foods like vegetables, fruits, whole grains, low-fat dairy products, and lean protein foods contain the nutrients you need without too many calories. Decrease your intake of foods high in solid fats, added sugars, and salt. Eat the right amount of  calories for you. Get information about a proper diet from your health care provider, if necessary.  . Regular physical exercise is one of the most important things you can do for your health. Most adults should get at least 150 minutes of moderate-intensity exercise (any activity that increases your heart rate and causes you to sweat) each week. In addition, most adults need muscle-strengthening exercises on 2 or more days a week.  Silver Sneakers may be a benefit available to you. To determine eligibility, you may visit the website: www.silversneakers.com or contact program at 431-828-6668 Mon-Fri between 8AM-8PM.   . Maintain a healthy weight. The body mass index (BMI) is a screening tool to identify possible weight problems. It provides an estimate of body fat based on height and weight. Your health care provider can find your BMI and can help you achieve or maintain a healthy weight.   For adults 20 years and older: ? A BMI below 18.5 is considered underweight. ? A BMI of 18.5 to 24.9 is normal. ? A BMI of 25 to 29.9 is considered overweight. ? A BMI of 30 and above is considered obese.   . Maintain normal blood lipids and cholesterol levels by exercising and minimizing your intake of saturated fat. Eat a balanced diet with plenty of fruit and vegetables. Blood tests for lipids and cholesterol should begin at age  20 and be repeated every 5 years. If your lipid or cholesterol levels are high, you are over 50, or you are at high risk for heart disease, you may need your cholesterol levels checked more frequently. Ongoing high lipid and cholesterol levels should be treated with medicines if diet and exercise are not working.  . If you smoke, find out from your health care provider how to quit. If you do not use tobacco, please do not start.  . If you choose to drink alcohol, please do not consume more than 2 drinks per day. One drink is considered to be 12 ounces (355 mL) of beer, 5 ounces  (148 mL) of wine, or 1.5 ounces (44 mL) of liquor.  . If you are 69-54 years old, ask your health care provider if you should take aspirin to prevent strokes.  . Use sunscreen. Apply sunscreen liberally and repeatedly throughout the day. You should seek shade when your shadow is shorter than you. Protect yourself by wearing long sleeves, pants, a wide-brimmed hat, and sunglasses year round, whenever you are outdoors.  . Once a month, do a whole body skin exam, using a mirror to look at the skin on your back. Tell your health care provider of new moles, moles that have irregular borders, moles that are larger than a pencil eraser, or moles that have changed in shape or color.

## 2017-01-23 NOTE — Progress Notes (Signed)
PCP notes:   Health maintenance:  Mammogram - pt will discuss with PCP at next appt Colon cancer screening - pt will discuss with PCP at next appt HIV screening - completed Hep C screening - completed  Abnormal screenings:   None   Patient concerns:   Urinary incontinence per questionnaire:   Urinary Incontinence Short Questionnaire 1. How often do you leak urine? Several times a day 2. How much urine do you normally leak (whether you wear protection or not)? A moderate amount 3. Overall, how much does leaking urine interfere with your everyday life?  Please circle a number between 0(not at all) and 10(a great deal). Patient's response:4 4. When does urine leak?  Leaks when I cough or sneeze  Nurse concerns:  None  Next PCP appt:   02/01/17 @ 1415  I reviewed health advisor's note, was available for consultation on the day of service listed in this note, and agree with documentation and plan. Elsie Stain, MD.

## 2017-01-23 NOTE — Progress Notes (Signed)
Pre visit review using our clinic review tool, if applicable. No additional management support is needed unless otherwise documented below in the visit note. 

## 2017-01-24 LAB — HEPATITIS C ANTIBODY: HCV AB: NEGATIVE

## 2017-01-24 LAB — HIV ANTIBODY (ROUTINE TESTING W REFLEX): HIV 1&2 Ab, 4th Generation: NONREACTIVE

## 2017-02-01 ENCOUNTER — Ambulatory Visit (INDEPENDENT_AMBULATORY_CARE_PROVIDER_SITE_OTHER): Payer: Medicare HMO | Admitting: Family Medicine

## 2017-02-01 ENCOUNTER — Encounter: Payer: Self-pay | Admitting: Family Medicine

## 2017-02-01 VITALS — BP 130/70 | HR 72 | Temp 98.8°F | Wt 222.5 lb

## 2017-02-01 DIAGNOSIS — R739 Hyperglycemia, unspecified: Secondary | ICD-10-CM

## 2017-02-01 DIAGNOSIS — E785 Hyperlipidemia, unspecified: Secondary | ICD-10-CM

## 2017-02-01 DIAGNOSIS — Z0000 Encounter for general adult medical examination without abnormal findings: Secondary | ICD-10-CM

## 2017-02-01 DIAGNOSIS — N393 Stress incontinence (female) (male): Secondary | ICD-10-CM | POA: Diagnosis not present

## 2017-02-01 DIAGNOSIS — R7989 Other specified abnormal findings of blood chemistry: Secondary | ICD-10-CM | POA: Diagnosis not present

## 2017-02-01 DIAGNOSIS — J309 Allergic rhinitis, unspecified: Secondary | ICD-10-CM | POA: Diagnosis not present

## 2017-02-01 DIAGNOSIS — Z1211 Encounter for screening for malignant neoplasm of colon: Secondary | ICD-10-CM

## 2017-02-01 DIAGNOSIS — F410 Panic disorder [episodic paroxysmal anxiety] without agoraphobia: Secondary | ICD-10-CM | POA: Diagnosis not present

## 2017-02-01 DIAGNOSIS — R945 Abnormal results of liver function studies: Secondary | ICD-10-CM

## 2017-02-01 NOTE — Progress Notes (Signed)
Health maintenance:  Mammogram - d/w pt.  Colon cancer screening - d/w pt. D/w patient MO:[id number] for colon cancer screening, including IFOB vs. colonoscopy.  Risks and benefits of both were discussed and patient voiced understanding.  Pt elects for:  IFOB.   HIV screening - completed, neg Hep C screening - completed, neg  Patient concerns:  Urinary incontinence per questionnaire:   Urinary Incontinence Short Questionnaire How often do you leak urine?Several times a day How much urine do you normally leak (whether you wear protection or not)? A moderate amount Overall, how much does leaking urine interfere with your everyday life? Please circle a number between 0(not at all) and 10(a great deal). Patient's response:4 When does urine leak?  Leaks when I cough or sneeze Has tried and failed Kegel exercises.  Not burning with urination.   D/w pt about urogyn referral for possible noninvasive options.    S/p hernia repair.  D/w pt about activity as tolerated.    Allergies.  Not always better with OTC allergy medicine.  Still on flonase, 2 sprays per nostril.  She rotates OTC allergy medicine about once every month or so now.  It seems like she will have some benefit from an over-the-counter antihistamine for about 1 week but then the effect wear off.  Anxiety.  She couldn't tolerate effexor, she got more irritable on the medicine.  Still on BZD w/o ADE.    HLD.  Able to tolerate simvastatin. LFTs improved, d/w pt.    Mild hyperglycemia, d/w pt.    Advance directive  D/w pt.  [name] designated if patient were incapacitated.    PMH and SH reviewed  ROS: Per HPI unless specifically indicated in ROS section   Meds, vitals, and allergies reviewed.   GEN: nad, alert and oriented HEENT: mucous membranes moist NECK: supple w/o LA CV: rrr.  no murmur PULM: ctab, no inc wob ABD: soft, +bs, hernia repair appears well-healed EXT: no edema SKIN: no acute rash

## 2017-02-01 NOTE — Progress Notes (Signed)
Pre visit review using our clinic review tool, if applicable. No additional management support is needed unless otherwise documented below in the visit note. 

## 2017-02-01 NOTE — Patient Instructions (Addendum)
Please call about a mammogram.   Go to the lab on the way out.  We'll contact you with your lab report. Andrea Webster will call about your referral. I would try switching antihistamines weekly (claritin, allegra, zyrtec).  Take care.  Glad to see you.  I'll check with cardiology in the meantime.

## 2017-02-02 ENCOUNTER — Telehealth: Payer: Self-pay | Admitting: Physician Assistant

## 2017-02-02 DIAGNOSIS — Z0000 Encounter for general adult medical examination without abnormal findings: Secondary | ICD-10-CM | POA: Insufficient documentation

## 2017-02-02 DIAGNOSIS — N393 Stress incontinence (female) (male): Secondary | ICD-10-CM | POA: Insufficient documentation

## 2017-02-02 DIAGNOSIS — J309 Allergic rhinitis, unspecified: Secondary | ICD-10-CM | POA: Insufficient documentation

## 2017-02-02 DIAGNOSIS — R739 Hyperglycemia, unspecified: Secondary | ICD-10-CM | POA: Insufficient documentation

## 2017-02-02 NOTE — Assessment & Plan Note (Signed)
D/w pt about diet and exercise as tolerated.

## 2017-02-02 NOTE — Assessment & Plan Note (Signed)
Anxiety.  D/w pt.  She couldn't tolerate effexor, she got more irritable on the medicine.  Still on BZD w/o ADE.   Continue as is.  She agrees.

## 2017-02-02 NOTE — Assessment & Plan Note (Signed)
See above re: HLD.

## 2017-02-02 NOTE — Assessment & Plan Note (Signed)
D/w pt.  Refer.  She agrees.

## 2017-02-02 NOTE — Assessment & Plan Note (Signed)
LFTs much improved. I would consider the LFT elevation resolved, given that she only has 1 minimal elevation in liver enzymes. I will ask for input from the cardiac/lipid clinic. D/w pt.

## 2017-02-02 NOTE — Telephone Encounter (Signed)
Per Christell Faith, PA-C, pt need appt w/lipid clinic in [place]. Pt cancelled January appt d/t illness. Pt agreeable to March 20, 2:30pm. Provided address and phone number along with directions to the office.

## 2017-02-02 NOTE — Assessment & Plan Note (Signed)
Reasonable to try Claritin for 1 week, then rotate to Allegra for one week and rotate Zyrtec for one week. This may be more effective for her. Continue Flonase. Update me as needed. She agrees.

## 2017-02-02 NOTE — Assessment & Plan Note (Signed)
Health maintenance:  Mammogram - d/w pt.  Colon cancer screening - d/w pt. D/w patient AR:WPTYYPE for colon cancer screening, including IFOB vs. colonoscopy.  Risks and benefits of both were discussed and patient voiced understanding.  Pt elects for:  IFOB.   HIV screening - completed, neg Hep C screening - completed, neg

## 2017-02-03 ENCOUNTER — Other Ambulatory Visit: Payer: Self-pay | Admitting: Physician Assistant

## 2017-02-06 ENCOUNTER — Encounter: Payer: Self-pay | Admitting: Urology

## 2017-02-06 ENCOUNTER — Ambulatory Visit (INDEPENDENT_AMBULATORY_CARE_PROVIDER_SITE_OTHER): Payer: Medicare HMO | Admitting: Urology

## 2017-02-06 VITALS — BP 158/82 | HR 96 | Ht 59.5 in | Wt 221.2 lb

## 2017-02-06 DIAGNOSIS — N393 Stress incontinence (female) (male): Secondary | ICD-10-CM | POA: Diagnosis not present

## 2017-02-06 DIAGNOSIS — N3946 Mixed incontinence: Secondary | ICD-10-CM | POA: Diagnosis not present

## 2017-02-06 LAB — BLADDER SCAN AMB NON-IMAGING: SCAN RESULT: 124

## 2017-02-06 NOTE — Progress Notes (Signed)
02/06/2017 4:07 PM   Andrea Webster 1956/07/05 [id number]  Referring provider: Tonia Ghent, MD [address] [place], [place] [address]  Chief Complaint  Patient presents with  . Urinary Incontinence    HPI: I was consulted to assess the patient's worsening urinary incontinence over the last many months or years. She recently describes a large abdominal hernia in the lower abdomen and the use mesh.  She leaks with coughing and sneezing but not bending or lifting. She has urgency incontinence if she holds it too long. She reports that the stress incontinence is most significant. She denies bedwetting. She wears 2 pads a day that are damp  She voids very frequently and cannot sit through it to our movie. She voids approximately every 1 hour and gets up once or twice a night to urinate  Her flow was good and she feels empty.  She denies a history kidney stones previous GU surgery and she does not get a lot of bladder infections. She has no neurologic issues. She has had a hysterectomy. Her bowel function is normal. The presentation is not medically treated  Modifying factors: There are no other modifying factors  Associated signs and symptoms: There are no other associated signs and symptoms Aggravating and relieving factors: There are no other aggravating or relieving factors Severity: Moderate Duration: Persistent     PMH: Past Medical History:  Diagnosis Date  . Anxiety   . Arthritis   . Baker's cyst    x 2  . CAD (coronary artery disease)    a. s/p MI & PTCA;  b. Ex MV: basal inflat, mid inflat, apical inf, apical lat ischemia;  c. 04/2015 PCI: LM nl, LAD 60p (FFR 0.83), D1 60ost, LCX 50p, 91m (2.75x15 Xience Alpine DES), OM1/2/3 min irregs, RCA 122m CTO.  . Depression    intolerant of paxil, wellbutrin, zoloft  . GERD (gastroesophageal reflux disease)   . Hayfever   . headaches   . Heart murmur   . High cholesterol   . Hypertension   . Insomnia   .  Myocardial infarction   . RLS (restless legs syndrome)   . Sciatica   . Smoker    a. quit 01/2014.    Surgical History: Past Surgical History:  Procedure Laterality Date  . ANGIOPLASTY  1997  . APPENDECTOMY  1978   open  . [place]  . CARDIAC CATHETERIZATION N/A 04/30/2015   Procedure: Left Heart Cath;  Surgeon: Wellington Hampshire, MD;  Location: [facility];  Service: Cardiovascular;  Laterality: N/A;  . CARPAL TUNNEL RELEASE    . CESAREAN SECTION  1980  . CESAREAN SECTION  1984  . CHOLECYSTECTOMY N/A 03/30/2016   Procedure: LAPAROSCOPIC CHOLECYSTECTOMY WITH INTRAOPERATIVE CHOLANGIOGRAM;  Surgeon: Jules Husbands, MD;  Location: [facility];  Service: General;  Laterality: N/A;  . [place]  [facility]  . HERNIA REPAIR    . VAGINAL HYSTERECTOMY      Home Medications:  Allergies as of 02/06/2017      Reactions   Crestor [rosuvastatin Calcium] Other (See Comments)   aches   Effexor [venlafaxine] Other (See Comments)   irritable   Lipitor [atorvastatin] Other (See Comments)   Intolerant   Paroxetine Hcl    Sertraline Hcl    Wellbutrin [bupropion  Hcl]       Medication List       Accurate as of 02/06/17  4:07 PM. Always use your most recent med list.          ALPRAZolam 1 MG tablet Commonly known as:  XANAX TAKE 1 TABLET THREE TIMES DAILY AS NEEDED FOR ANXIETY   aspirin 81 MG chewable tablet Chew 81 mg by mouth daily.   fluticasone 50 MCG/ACT nasal spray Commonly known as:  FLONASE USE TWO SPRAY(S) IN EACH NOSTRIL ONCE DAILY   isosorbide mononitrate 60 MG 24 hr tablet Commonly known as:  IMDUR TAKE 1 TABLET EVERY DAY   metoprolol tartrate 25 MG tablet Commonly known as:  LOPRESSOR TAKE 1 TABLET TWICE DAILY   multivitamin tablet Take 1 tablet by mouth daily.   nitroGLYCERIN 0.4 MG SL tablet Commonly  known as:  NITROSTAT PLACE 1 TABLET (0.4 MG TOTAL) UNDER THE TONGUE EVERY 5 (FIVE) MINUTES AS NEEDED FOR CHEST PAIN.   pantoprazole 40 MG tablet Commonly known as:  PROTONIX TAKE 1 TABLET EVERY DAY   senna 8.6 MG tablet Commonly known as:  SENOKOT Take 1 tablet by mouth as needed for constipation.   simvastatin 20 MG tablet Commonly known as:  ZOCOR Take 1 tablet (20 mg total) by mouth at bedtime.   VENTOLIN HFA 108 (90 Base) MCG/ACT inhaler Generic drug:  albuterol INHALE 2 PUFFS EVERY 6  HOURS AS NEEDED FOR WHEEZING OR SHORTNESS OF BREATH.   zolpidem 10 MG tablet Commonly known as:  AMBIEN TAKE 1 TABLET AT BEDTIME AS NEEDED FOR SLEEP       Allergies:  Allergies  Allergen Reactions  . Crestor [Rosuvastatin Calcium] Other (See Comments)    aches  . Effexor [Venlafaxine] Other (See Comments)    irritable  . Lipitor [Atorvastatin] Other (See Comments)    Intolerant  . Paroxetine Hcl   . Sertraline Hcl   . Wellbutrin [Bupropion Hcl]     Family History: Family History  Problem Relation Age of Onset  . Heart disease Mother   . Diabetes Mother   . Hypertension Mother   . Heart attack Mother   . Alcohol abuse Father   . Heart disease Father   . Heart attack Father   . Breast cancer Maternal Grandmother   . Colon cancer Neg Hx   . Bladder Cancer Neg Hx   . Kidney cancer Neg Hx     Social History:  reports that she quit smoking about 1 years ago. Her smoking use included Cigarettes. She has a 6.60 pack-year smoking history. She has never used smokeless tobacco. She reports that she drinks alcohol. She reports that she does not use drugs.  ROS: UROLOGY Frequent Urination?: Yes Hard to postpone urination?: No Burning/pain with urination?: No Get up at night to urinate?: Yes Leakage of urine?: Yes Urine stream starts and stops?: No Trouble starting stream?: No Do you have to strain to urinate?: No Blood in urine?: No Urinary tract infection?: No Sexually  transmitted disease?: No Injury to kidneys or bladder?: No Painful intercourse?: No Weak stream?: No Currently pregnant?: No Vaginal bleeding?: No Last menstrual period?: n  Gastrointestinal Nausea?: No Vomiting?: No Indigestion/heartburn?: Yes Diarrhea?: No Constipation?: No  Constitutional Fever: No Night sweats?: No Weight loss?: No Fatigue?: No  Skin Skin rash/lesions?: No Itching?: No  Eyes Blurred vision?: No Double vision?: No  Ears/Nose/Throat Sore throat?: No Sinus problems?: No  Hematologic/Lymphatic Swollen glands?: No Easy bruising?: No  Cardiovascular Leg swelling?:  Yes Chest pain?: Yes  Respiratory Cough?: Yes Shortness of breath?: No  Endocrine Excessive thirst?: No  Musculoskeletal Back pain?: Yes Joint pain?: No  Neurological Headaches?: No Dizziness?: No  Psychologic Depression?: Yes Anxiety?: Yes  Physical Exam: BP (!) 158/82 (BP Location: Left Arm, Patient Position: Sitting, Cuff Size: Large)   Pulse 96   Ht 4' 11.5" (1.511 m)   Wt 100.3 kg (221 lb 3.2 oz)   BMI 43.93 kg/m   Constitutional:  Alert and oriented, No acute distress. HEENT: [place] AT, moist mucus membranes.  Trachea midline, no masses. Cardiovascular: No clubbing, cyanosis, or edema. Respiratory: Normal respiratory effort, no increased work of breathing. GI: Abdomen is soft, nontender, nondistended, no abdominal masses GU: Grade 1 hypermobility of the bladder neck and no stress incontinence or prolapse. She had a transverse incision above the umbilicus and a previous C-section scar. Likely she had abdominal hernia and mesh would be well above the symphysis pubis Skin: No rashes, bruises or suspicious lesions. Lymph: No cervical or inguinal adenopathy. Neurologic: Grossly intact, no focal deficits, moving all 4 extremities. Psychiatric: Normal mood and affect.  Laboratory Data: Lab Results  Component Value Date   WBC 9.4 03/13/2016   HGB 13.0 03/13/2016    HCT 37.2 03/13/2016   MCV 90.2 03/13/2016   PLT 240 03/13/2016    Lab Results  Component Value Date   CREATININE 0.72 01/23/2017    No results found for: PSA  No results found for: TESTOSTERONE  No results found for: HGBA1C  Urinalysis    Component Value Date/Time   BILIRUBINUR Negative 12/15/2015 1155   PROTEINUR Negative 12/15/2015 1155   UROBILINOGEN 0.2 12/15/2015 1155   NITRITE Negative 12/15/2015 1155   LEUKOCYTESUR Negative 12/15/2015 1155    Pertinent Imaging: none  Assessment & Plan:  The patient has mixed incontinence and has significant urinary frequency and clinically I believe has a moderate severe overactive bladder. She has mild nighttime frequency.  The pathophysiology of urinary incontinence and the role of urodynamics was discussed.  1. Stress incontinence, female 2. Mixed incontinence 3. Urinary frequency 4. Nighttime frequency   - Urinalysis, Complete - Bladder Scan (Post Void Residual) in office   No Follow-up on file.  Reece Packer, MD  [facility] [address], [place], [place] [address] [phone]

## 2017-02-07 ENCOUNTER — Ambulatory Visit (INDEPENDENT_AMBULATORY_CARE_PROVIDER_SITE_OTHER): Payer: Medicare HMO | Admitting: Pharmacist

## 2017-02-07 VITALS — BP 136/78 | HR 81 | Ht 59.5 in | Wt 218.0 lb

## 2017-02-07 DIAGNOSIS — E785 Hyperlipidemia, unspecified: Secondary | ICD-10-CM

## 2017-02-07 LAB — URINALYSIS, COMPLETE
Bilirubin, UA: NEGATIVE
Glucose, UA: NEGATIVE
Ketones, UA: NEGATIVE
LEUKOCYTES UA: NEGATIVE
NITRITE UA: NEGATIVE
PH UA: 6 (ref 5.0–7.5)
Protein, UA: NEGATIVE
RBC UA: NEGATIVE
Specific Gravity, UA: 1.01 (ref 1.005–1.030)
Urobilinogen, Ur: 0.2 mg/dL (ref 0.2–1.0)

## 2017-02-07 NOTE — Patient Instructions (Addendum)
Stop simvastatin (Zocor)  Start atorvastatin (Lipitor) 20mg  daily - you can break your 40mg  tablets in half  Check liver enzymes in 1 month on Friday April 20th at 2pm in [place] - do not need to fast  Check cholesterol in 2 months at the end of May with Damita Dunnings for cholesterol and liver enzymes - fast for 8 hours prior  Call Megan in lipid clinic with tolerability to Lipitor [phone]

## 2017-02-07 NOTE — Progress Notes (Signed)
Patient ID: Andrea Webster                 DOB: 1956-06-05                    MRN: [id number]     HPI: Andrea Webster is a 61 y.o. female patient of Dr Fletcher Anon referred to lipid clinic by Christell Faith, PA. PMH is significant for CAD s/p MI at age 32 with PTCA of unknown vessel and another MI at age 29, strong family hx of CAD, prior tobacco abuse quit in 2016, HLD, HTN, GERD, anxiety, and obesity. Cath in June 2016 for chest pain showed chronic total occlusion of RCA, severe mid LCx disease with 90% stenosis, and moderate proximal LAD disease, LCx treated with DES.  Pt currently takes simvastatin 20mg  daily. She took simvastatin for years after her first MI at age 52. After her 2nd MI at age 76, Dr Fletcher Anon changed her to Lipitor 80mg  daily. She had increased LFTs even with dose reduction to 40mg  daily. However, pt had gallbladder issues at the same time which may have been causing LFT elevation. She was switched to Crestor after this but experienced a headache and generalized muscle aches. Since then, she has been taking simvastatin 20mg  daily again.  She had a hernia repair on January 29th and has been slowly increasing her activity level. She does not eat fried foods and eats vegetables frequently.  Current Medications: simvastatin 20mg  daily Intolerances: Crestor 10mg - headaches, Lipitor 40 and 80mg  - ? LFT increase but may have been due to gallbladder disease Risk Factors: CAD s/p MI, obesity LDL goal: 70mg /dL  Diet: Does not eat fried food. Eats a lot of vegetables, soup, and lean meats ([place] and chicken).  Exercise: Had hernia surgery in January and is slowly getting back to walking.   Family History: Alcohol abuse in her father; Breast cancer in her maternal grandmother; Diabetes in her mother; Heart attack in her father and mother; Heart disease in her father and mother; Hypertension in her mother.  Social History: Pt quit smoking in 2016, 6.6 pack year history. Drinks alcohol and denies  illicit drugs.  Labs: 10/2016: TC 206, TG 169, HDL 54, LDL 118, ALT 47 (simvastatin 20mg  daily)  Past Medical History:  Diagnosis Date  . Anxiety   . Arthritis   . Baker's cyst    x 2  . CAD (coronary artery disease)    a. s/p MI & PTCA;  b. Ex MV: basal inflat, mid inflat, apical inf, apical lat ischemia;  c. 04/2015 PCI: LM nl, LAD 60p (FFR 0.83), D1 60ost, LCX 50p, 74m (2.75x15 Xience Alpine DES), OM1/2/3 min irregs, RCA 110m CTO.  . Depression    intolerant of paxil, wellbutrin, zoloft  . GERD (gastroesophageal reflux disease)   . Hayfever   . headaches   . Heart murmur   . High cholesterol   . Hypertension   . Insomnia   . Myocardial infarction   . RLS (restless legs syndrome)   . Sciatica   . Smoker    a. quit 01/2014.    Current Outpatient Prescriptions on File Prior to Visit  Medication Sig Dispense Refill  . ALPRAZolam (XANAX) 1 MG tablet TAKE 1 TABLET THREE TIMES DAILY AS NEEDED FOR ANXIETY 270 tablet 1  . aspirin 81 MG chewable tablet Chew 81 mg by mouth daily.    . fluticasone (FLONASE) 50 MCG/ACT nasal spray USE TWO SPRAY(S) IN EACH NOSTRIL ONCE  DAILY (Patient not taking: Reported on 02/06/2017) 16 g 5  . isosorbide mononitrate (IMDUR) 60 MG 24 hr tablet TAKE 1 TABLET EVERY DAY 90 tablet 3  . metoprolol tartrate (LOPRESSOR) 25 MG tablet TAKE 1 TABLET TWICE DAILY 180 tablet 3  . Multiple Vitamin (MULTIVITAMIN) tablet Take 1 tablet by mouth daily.      . nitroGLYCERIN (NITROSTAT) 0.4 MG SL tablet PLACE 1 TABLET (0.4 MG TOTAL) UNDER THE TONGUE EVERY 5 (FIVE) MINUTES AS NEEDED FOR CHEST PAIN. 25 tablet 3  . pantoprazole (PROTONIX) 40 MG tablet TAKE 1 TABLET EVERY DAY 90 tablet 3  . senna (SENOKOT) 8.6 MG tablet Take 1 tablet by mouth as needed for constipation.     . simvastatin (ZOCOR) 20 MG tablet Take 1 tablet (20 mg total) by mouth at bedtime. (Patient not taking: Reported on 02/06/2017) 90 tablet 3  . VENTOLIN HFA 108 (90 Base) MCG/ACT inhaler INHALE 2 PUFFS EVERY 6   HOURS AS NEEDED FOR WHEEZING OR SHORTNESS OF BREATH. 54 g 1  . zolpidem (AMBIEN) 10 MG tablet TAKE 1 TABLET AT BEDTIME AS NEEDED FOR SLEEP 90 tablet 1   No current facility-administered medications on file prior to visit.     Allergies  Allergen Reactions  . Crestor [Rosuvastatin Calcium] Other (See Comments)    aches  . Effexor [Venlafaxine] Other (See Comments)    irritable  . Lipitor [Atorvastatin] Other (See Comments)    Intolerant  . Paroxetine Hcl   . Sertraline Hcl   . Wellbutrin [Bupropion Hcl]     Assessment/Plan:  1. Hyperlipidemia - LDL above goal 70mg /dL given hx of ASCVD. Will stop simvastatin and start atorvastatin 20mg  daily. If pt is tolerating, will increase to 40mg  daily after a few weeks. Will check LFTs in 1 month to ensure they remain stable - previous elevation likely due to gallbladder issues. Will check lipid panel in 2 months. Plan to titrate atorvastatin to max tolerated dose and add Zetia if needed to bring LDL to goal. Pt will call with atorvastatin tolerability.  Pt signed informed consent for [facility] lipid registry.  Megan E. Supple, PharmD, CPP, [place] [address], [place], [place] [address] Phone: [phone]; Fax: [phone] 02/07/2017 3:10 PM

## 2017-02-08 ENCOUNTER — Encounter: Payer: Self-pay | Admitting: *Deleted

## 2017-02-08 DIAGNOSIS — Z006 Encounter for examination for normal comparison and control in clinical research program: Secondary | ICD-10-CM

## 2017-02-08 NOTE — Progress Notes (Signed)
Late entry: Subject met inclusion and exclusion criteria. The informed consent form, study requirements and expectations were reviewed with the subject and questions and concerns were addressed prior to the signing of the consent form. The subject verbalized understanding of the trail requirements. The subject agreed to participate in the [facility] Registryand signed the informed consent. The informed consent was obtained prior to performance of any protocol-specific procedures for the subject. A copy of the signed informed consent was given to the subject and a copy was placed in the subject's medical record.  Hugh Pruitt, RN 02/07/2017 

## 2017-03-07 DIAGNOSIS — N3941 Urge incontinence: Secondary | ICD-10-CM | POA: Diagnosis not present

## 2017-03-08 ENCOUNTER — Other Ambulatory Visit (INDEPENDENT_AMBULATORY_CARE_PROVIDER_SITE_OTHER): Payer: Medicare HMO

## 2017-03-08 ENCOUNTER — Other Ambulatory Visit: Payer: Self-pay | Admitting: Urology

## 2017-03-08 DIAGNOSIS — E785 Hyperlipidemia, unspecified: Secondary | ICD-10-CM

## 2017-03-08 LAB — HEPATIC FUNCTION PANEL
ALT: 66 U/L — ABNORMAL HIGH (ref 0–35)
AST: 56 U/L — ABNORMAL HIGH (ref 0–37)
Albumin: 4.5 g/dL (ref 3.5–5.2)
Alkaline Phosphatase: 109 U/L (ref 39–117)
BILIRUBIN TOTAL: 0.9 mg/dL (ref 0.2–1.2)
Bilirubin, Direct: 0.1 mg/dL (ref 0.0–0.3)
Total Protein: 7.3 g/dL (ref 6.0–8.3)

## 2017-03-10 ENCOUNTER — Other Ambulatory Visit: Payer: Medicare HMO

## 2017-03-13 ENCOUNTER — Other Ambulatory Visit: Payer: Self-pay

## 2017-03-13 DIAGNOSIS — E785 Hyperlipidemia, unspecified: Secondary | ICD-10-CM

## 2017-03-14 ENCOUNTER — Ambulatory Visit: Payer: Medicare HMO | Admitting: Urology

## 2017-03-14 ENCOUNTER — Encounter: Payer: Self-pay | Admitting: Urology

## 2017-03-14 VITALS — BP 148/82 | HR 97 | Ht 59.5 in | Wt 223.4 lb

## 2017-03-14 DIAGNOSIS — N3946 Mixed incontinence: Secondary | ICD-10-CM

## 2017-03-14 MED ORDER — MIRABEGRON ER 50 MG PO TB24: 50.0000 mg | ORAL_TABLET | Freq: Every day | ORAL | 11 refills | Status: DC

## 2017-03-14 NOTE — Progress Notes (Signed)
03/14/2017 1:32 PM   Andrea Webster Jul 12, 1956 [id number]  Referring provider: Tonia Ghent, MD [address] [place], [place] [address]  Chief Complaint  Patient presents with  . Follow-up    urodynamics    HPI: I was consulted to assess the patient's worsening urinary incontinence over the last many months or years. She recently describes a large abdominal hernia in the lower abdomen and the use mesh.  She leaks with coughing and sneezing but not bending or lifting. She has urgency incontinence if she holds it too long. She reports that the stress incontinence is most significant. She denies bedwetting. She wears 2 pads a day that are damp  She voids very frequently and cannot sit through a two movie. She voids approximately every 1 hour and gets up once or twice a night to urinate  Her flow was good and she feels empty.  Grade 1 hypermobility of the bladder neck and no stress incontinence or prolapse. She had a transverse incision above the umbilicus and a previous C-section scar. Likely she had abdominal hernia and mesh would be well above the symphysis pubis  The patient has mixed incontinence and has significant urinary frequency and clinically I believe has a moderate severe overactive bladder. She has mild nighttime frequency.  Today Frequency and incontinence are stable The patient did not void and was catheterized for 200 mL on urodynamics. The patient's bladder capacity was 975 mL. She had very low pressure unstable bladder contractions and did not leak. She did feel urgency. She did not leak with a Valsalva pressure of 119 cm of water. During voluntary voiding she voided 915 mL with a maximum voiding pressure of 22 cm of water and empty efficiently. She did trigger a contraction with straining. Bladder neck descended 1-2 cm. Bladder was somewhat hyposensitive. Andrea Webster was suspect that most of the patient's leaking is likely coming from her overactive bladder.  The details of the urodynamics are signed and dictated     PMH: Past Medical History:  Diagnosis Date  . Anxiety   . Arthritis   . Baker's cyst    x 2  . CAD (coronary artery disease)    a. s/p MI & PTCA;  b. Ex MV: basal inflat, mid inflat, apical inf, apical lat ischemia;  c. 04/2015 PCI: LM nl, LAD 60p (FFR 0.83), D1 60ost, LCX 50p, 49m (2.75x15 Xience Alpine DES), OM1/2/3 min irregs, RCA 144m CTO.  . Depression    intolerant of paxil, wellbutrin, zoloft  . GERD (gastroesophageal reflux disease)   . Hayfever   . headaches   . Heart murmur   . High cholesterol   . Hypertension   . Insomnia   . Myocardial infarction ([place])   . RLS (restless legs syndrome)   . Sciatica   . Smoker    a. quit 01/2014.    Surgical History: Past Surgical History:  Procedure Laterality Date  . ANGIOPLASTY  1997  . APPENDECTOMY  1978   open  . [place]  . CARDIAC CATHETERIZATION N/A 04/30/2015   Procedure: Left Heart Cath;  Surgeon: Wellington Hampshire, MD;  Location: [facility];  Service: Cardiovascular;  Laterality: N/A;  . CARPAL TUNNEL RELEASE    . CESAREAN SECTION  1980  . CESAREAN SECTION  1984  . CHOLECYSTECTOMY N/A 03/30/2016   Procedure: LAPAROSCOPIC CHOLECYSTECTOMY WITH INTRAOPERATIVE CHOLANGIOGRAM;  Surgeon: Jules Husbands, MD;  Location: [facility]  ORS;  Service: General;  Laterality: N/A;  . [place] WITH STENT PLACEMENT  [facility]  . HERNIA REPAIR    . VAGINAL HYSTERECTOMY      Home Medications:  Allergies as of 03/14/2017      Reactions   Crestor [rosuvastatin Calcium] Other (See Comments)   aches   Effexor [venlafaxine] Other (See Comments)   irritable   Lipitor [atorvastatin] Other (See Comments)   Intolerant   Paroxetine Hcl    Sertraline Hcl    Wellbutrin [bupropion Hcl]       Medication List       Accurate as of 03/14/17   1:32 PM. Always use your most recent med list.          ALPRAZolam 1 MG tablet Commonly known as:  XANAX TAKE 1 TABLET THREE TIMES DAILY AS NEEDED FOR ANXIETY   aspirin 81 MG chewable tablet Chew 81 mg by mouth daily.   atorvastatin 20 MG tablet Commonly known as:  LIPITOR Take 1 tablet (20 mg total) by mouth daily.   fluticasone 50 MCG/ACT nasal spray Commonly known as:  FLONASE USE TWO SPRAY(S) IN EACH NOSTRIL ONCE DAILY   isosorbide mononitrate 60 MG 24 hr tablet Commonly known as:  IMDUR TAKE 1 TABLET EVERY DAY   metoprolol tartrate 25 MG tablet Commonly known as:  LOPRESSOR TAKE 1 TABLET TWICE DAILY   multivitamin tablet Take 1 tablet by mouth daily.   nitroGLYCERIN 0.4 MG SL tablet Commonly known as:  NITROSTAT PLACE 1 TABLET (0.4 MG TOTAL) UNDER THE TONGUE EVERY 5 (FIVE) MINUTES AS NEEDED FOR CHEST PAIN.   pantoprazole 40 MG tablet Commonly known as:  PROTONIX TAKE 1 TABLET EVERY DAY   senna 8.6 MG tablet Commonly known as:  SENOKOT Take 1 tablet by mouth as needed for constipation.   VENTOLIN HFA 108 (90 Base) MCG/ACT inhaler Generic drug:  albuterol INHALE 2 PUFFS EVERY 6  HOURS AS NEEDED FOR WHEEZING OR SHORTNESS OF BREATH.   zolpidem 10 MG tablet Commonly known as:  AMBIEN TAKE 1 TABLET AT BEDTIME AS NEEDED FOR SLEEP       Allergies:  Allergies  Allergen Reactions  . Crestor [Rosuvastatin Calcium] Other (See Comments)    aches  . Effexor [Venlafaxine] Other (See Comments)    irritable  . Lipitor [Atorvastatin] Other (See Comments)    Intolerant  . Paroxetine Hcl   . Sertraline Hcl   . Wellbutrin [Bupropion Hcl]     Family History: Family History  Problem Relation Age of Onset  . Heart disease Mother   . Diabetes Mother   . Hypertension Mother   . Heart attack Mother   . Alcohol abuse Father   . Heart disease Father   . Heart attack Father   . Breast cancer Maternal Grandmother   . Colon cancer Neg Hx   . Bladder Cancer Neg Hx    . Kidney cancer Neg Hx     Social History:  reports that she quit smoking about 2 years ago. Her smoking use included Cigarettes. She has a 6.60 pack-year smoking history. She has never used smokeless tobacco. She reports that she drinks alcohol. She reports that she does not use drugs.  ROS: UROLOGY Frequent Urination?: No Hard to postpone urination?: No Burning/pain with urination?: No Get up at night to urinate?: No Leakage of urine?: No Urine stream starts and  stops?: No Trouble starting stream?: No Do you have to strain to urinate?: No Blood in urine?: No Urinary tract infection?: No Sexually transmitted disease?: No Injury to kidneys or bladder?: No Painful intercourse?: No Weak stream?: No Currently pregnant?: No Vaginal bleeding?: No Last menstrual period?: n  Gastrointestinal Nausea?: No Vomiting?: No Indigestion/heartburn?: No Diarrhea?: No Constipation?: No  Constitutional Fever: No Night sweats?: No Weight loss?: No Fatigue?: No  Skin Skin rash/lesions?: No Itching?: No  Eyes Blurred vision?: No Double vision?: No  Ears/Nose/Throat Sore throat?: No Sinus problems?: No  Hematologic/Lymphatic Swollen glands?: No Easy bruising?: No  Cardiovascular Leg swelling?: No Chest pain?: No  Respiratory Cough?: No Shortness of breath?: No  Endocrine Excessive thirst?: No  Musculoskeletal Back pain?: No Joint pain?: No  Neurological Headaches?: No Dizziness?: No  Psychologic Depression?: No Anxiety?: No  Physical Exam: BP (!) 148/82   Pulse 97   Ht 4' 11.5" (1.511 m)   Wt 223 lb 6.4 oz (101.3 kg)   BMI 44.37 kg/m   Constitutional:  Alert and oriented, No acute distress.   Laboratory Data: Lab Results  Component Value Date   WBC 9.4 03/13/2016   HGB 13.0 03/13/2016   HCT 37.2 03/13/2016   MCV 90.2 03/13/2016   PLT 240 03/13/2016    Lab Results  Component Value Date   CREATININE 0.72 01/23/2017     Urinalysis      Component Value Date/Time   APPEARANCEUR Clear 02/06/2017 1523   GLUCOSEU Negative 02/06/2017 1523   BILIRUBINUR Negative 02/06/2017 1523   PROTEINUR Negative 02/06/2017 1523   UROBILINOGEN 0.2 12/15/2015 1155   NITRITE Negative 02/06/2017 1523   LEUKOCYTESUR Negative 02/06/2017 1523    Pertinent Imaging: none  Assessment & Plan:  The patient has milder mixed incontinence. I will try to help her with medical and behavioral therapy. If we do not reach her treatment goal the role of surgery will be discussed. She does have a large capacity bladder and did have some flow issues during the study. One could argue that she is a slightly increased risk of retention with the sling. I would need to re-quantitate the stress and urge components and set up appropriate treatment goals. It does appear that the patient stress incontinence is mild.  The patient was dry for 2 days after urodynamics noted. I will see her back in 4-6 weeks on beta 3 Agnes and behavioral therapy. She understands that we will reevaluate other treatment options if we cannot help her with behavioral medical therapy  There are no diagnoses linked to this encounter.  No Follow-up on file.  Reece Packer, MD  [facility] [address], [place], [place] [address] [phone]

## 2017-03-20 ENCOUNTER — Other Ambulatory Visit: Payer: Self-pay | Admitting: Family Medicine

## 2017-03-20 NOTE — Telephone Encounter (Signed)
Both are too early.  Thanks.

## 2017-03-20 NOTE — Telephone Encounter (Signed)
Electronic refill request. Last office visit:   02/01/17 Last Filled:   Alprazolam 270 tablet 1 11/08/2016  Last Filled:   Zolpidem 90 tablet 1 11/08/2016  Please advise.

## 2017-03-24 IMAGING — XA DG CHOLANGIOGRAM OPERATIVE
1 series · 4 of 4 positions shown · non-contrast
Comparison: None.

CLINICAL DATA: 59-year-old female with a history of cholelithiasis

EXAM:
INTRAOPERATIVE CHOLANGIOGRAM
TECHNIQUE: Cholangiographic images from the C-arm fluoroscopic device were
submitted for interpretation post-operatively. Please see the
procedural report for the amount of contrast and the fluoroscopy
time utilized.

[Series 7: gastro standard · 4 of 102 frames shown]
[frame 1/102]
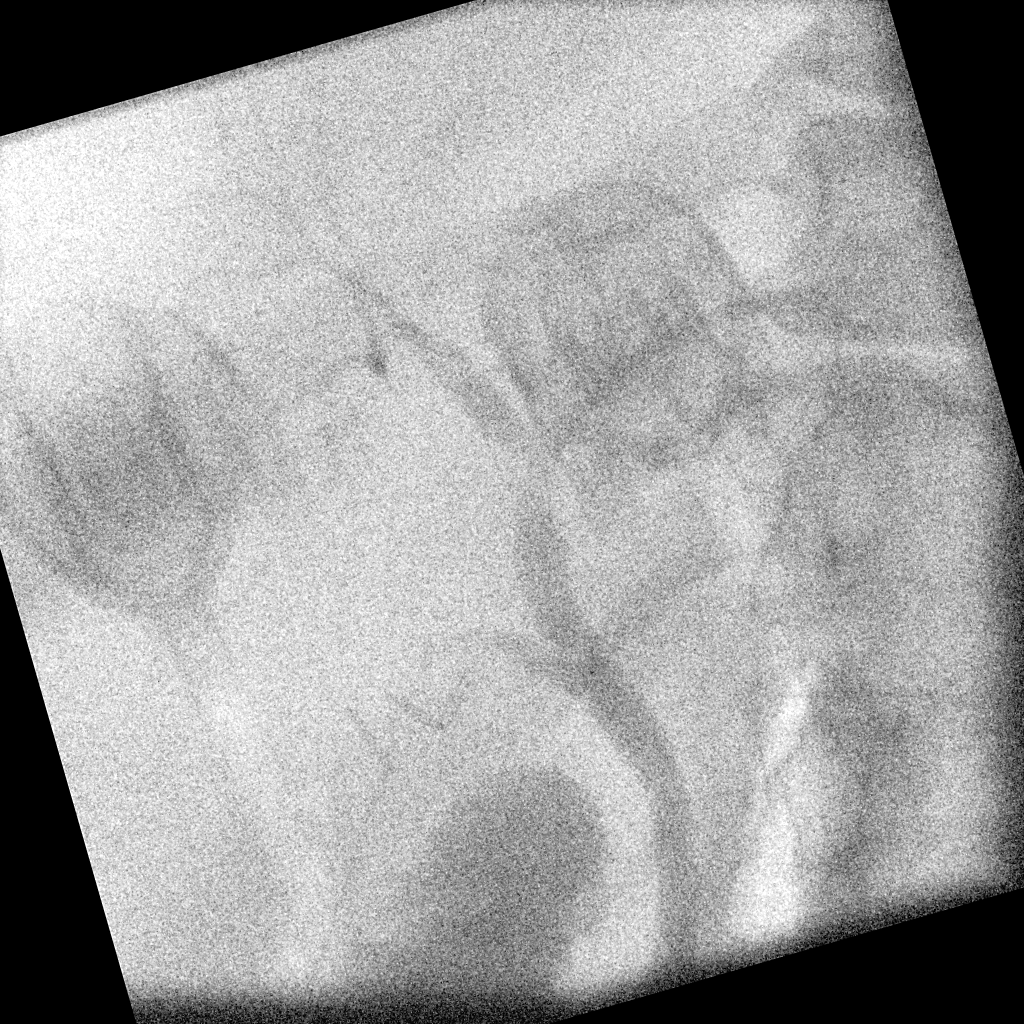
[frame 16/102]
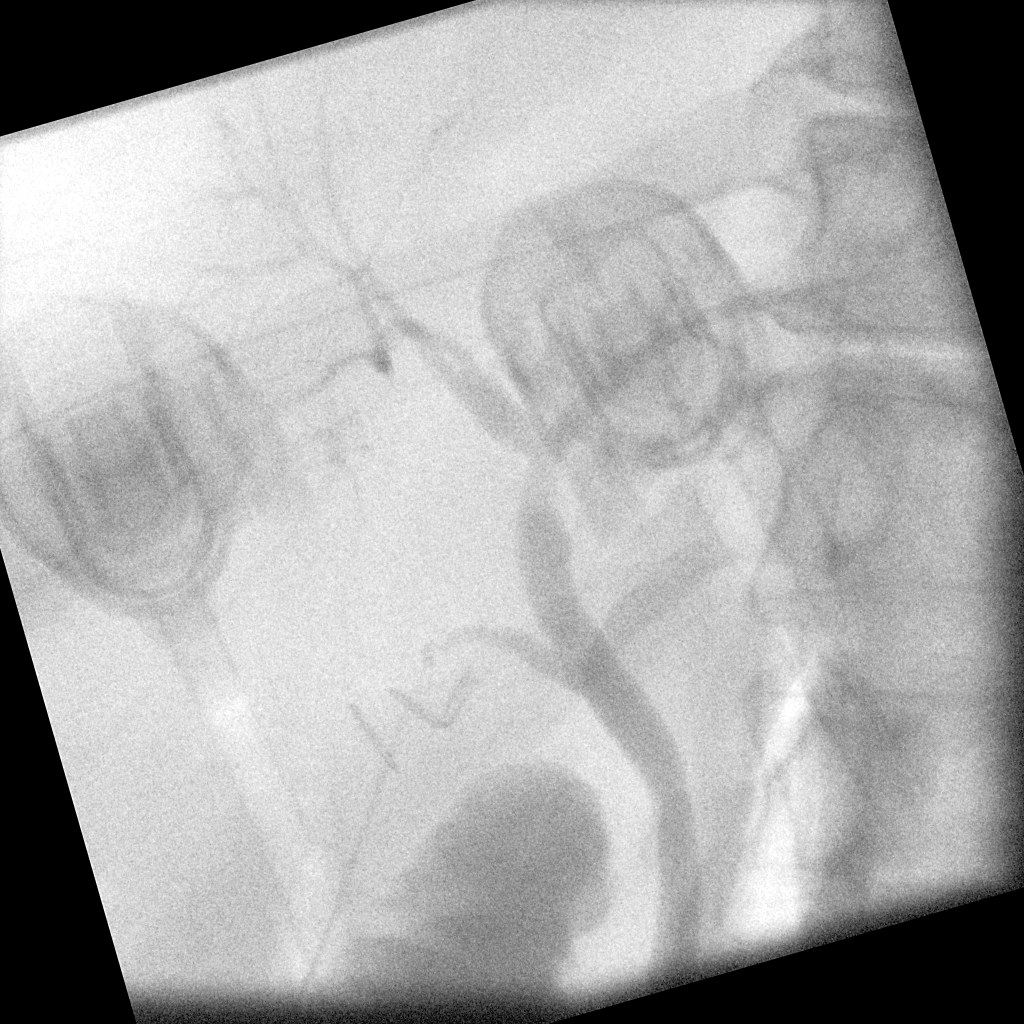
[frame 52/102]
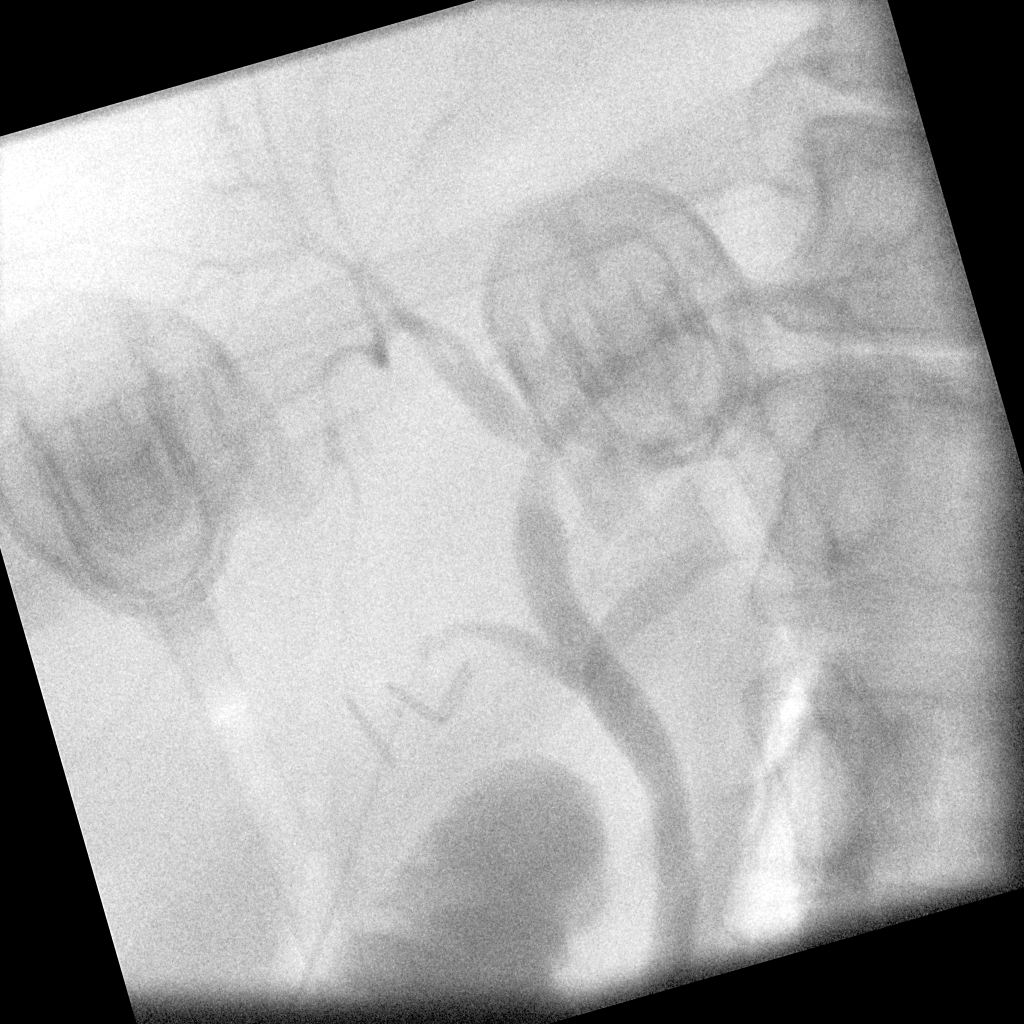
[frame 87/102]
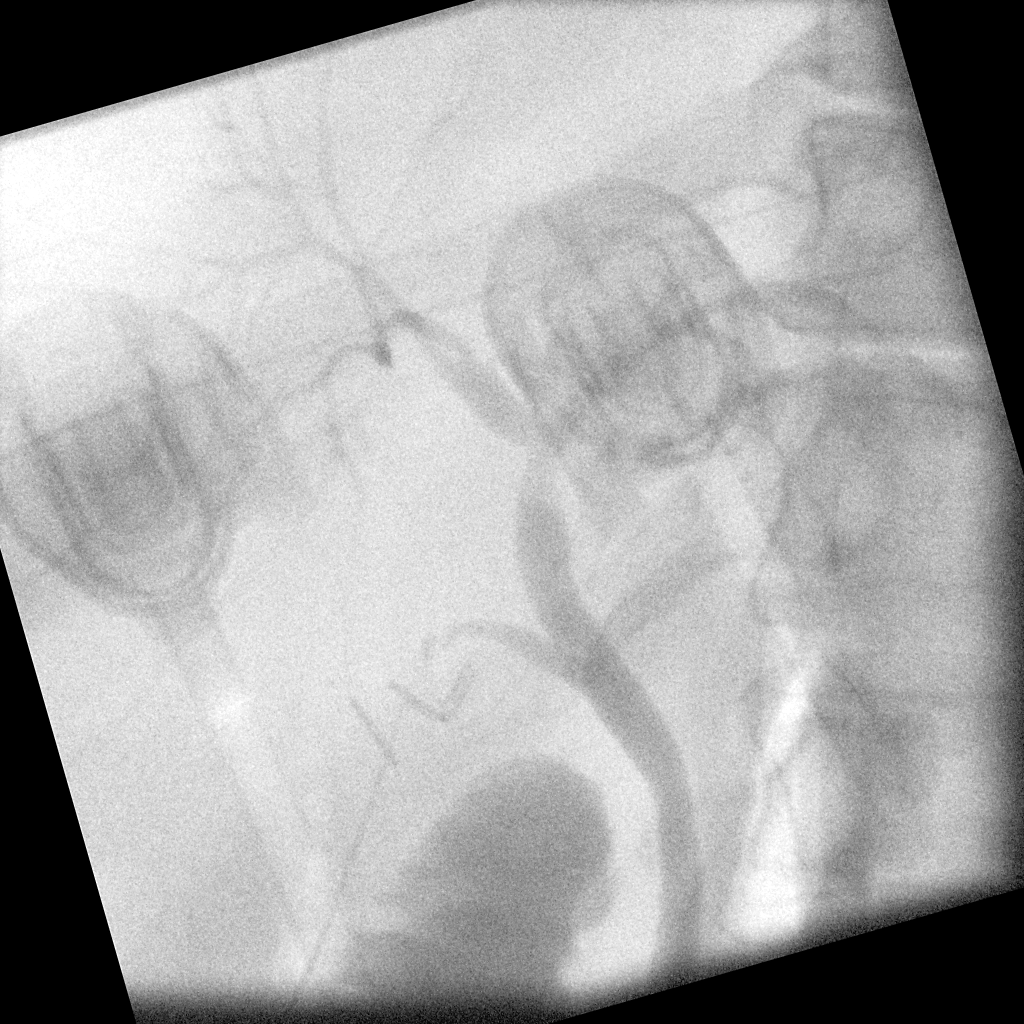

[4 of 4 positions shown; findings below may reference images not displayed]

FINDINGS: Surgical instruments project over the upper abdomen.

There is cannulation of the cystic duct/gallbladder neck, with
antegrade infusion of contrast. Caliber of the extrahepatic ductal
system within normal limits.

Note that the images have excluded the distal CBD and ampulla
IMPRESSION: Limited images during intraoperative cholangiogram demonstrating
partial opacification/ imaging of the extrahepatic biliary system.
Please refer to the dictated operative report for full details of
intraoperative findings and procedure.

## 2017-03-28 NOTE — Telephone Encounter (Signed)
Pt wants to know why xanax and ambien were not approved. Pt said that [employer] advised pt both were last filled on 01/09/17.  Pt still has medication but called our office because [place] advised her to. Pt will wait until 7-10 days prior to needing refill next month and will request refill for xanax and ambien again. Pt needed nothing further.

## 2017-04-21 ENCOUNTER — Ambulatory Visit: Payer: Medicare HMO

## 2017-04-25 ENCOUNTER — Other Ambulatory Visit: Payer: Self-pay | Admitting: Cardiovascular Disease

## 2017-04-25 ENCOUNTER — Other Ambulatory Visit: Payer: Self-pay | Admitting: *Deleted

## 2017-04-25 MED ORDER — FLUTICASONE PROPIONATE 50 MCG/ACT NA SUSP: NASAL | 3 refills | Status: DC

## 2017-04-27 ENCOUNTER — Other Ambulatory Visit: Payer: Self-pay | Admitting: *Deleted

## 2017-04-27 MED ORDER — FLUTICASONE PROPIONATE 50 MCG/ACT NA SUSP: NASAL | 3 refills | Status: DC

## 2017-05-02 ENCOUNTER — Other Ambulatory Visit: Payer: Self-pay

## 2017-05-02 MED ORDER — ZOLPIDEM TARTRATE 10 MG PO TABS: 10.0000 mg | ORAL_TABLET | Freq: Every evening | ORAL | 1 refills | Status: DC | PRN

## 2017-05-02 MED ORDER — ALPRAZOLAM 1 MG PO TABS: ORAL_TABLET | ORAL | 1 refills | Status: DC

## 2017-05-02 NOTE — Telephone Encounter (Signed)
Rx printed and in [place] box. Will cc Dr Damita Dunnings as [facility]

## 2017-05-02 NOTE — Telephone Encounter (Signed)
Pt left v/m requesting rx for alprazolam (last printed # 270 x 1 on 11/08/16) and zolpidem (last printed # 90 x 1 on 11/08/16) be faxed to [facility] pharmacy;last seen 02/01/17.

## 2017-05-03 NOTE — Telephone Encounter (Signed)
Noted. Thanks.

## 2017-05-03 NOTE — Telephone Encounter (Signed)
Prescriptions faxed to [facility] [phone].

## 2017-05-08 ENCOUNTER — Ambulatory Visit: Payer: Medicare HMO

## 2017-06-08 ENCOUNTER — Other Ambulatory Visit: Payer: Self-pay | Admitting: Cardiovascular Disease

## 2017-06-27 ENCOUNTER — Other Ambulatory Visit: Payer: Self-pay | Admitting: Family Medicine

## 2017-06-28 NOTE — Telephone Encounter (Signed)
Received refill request electronically for Simvastatin. Medication no longer on med list.  Medication list shows Lipitor. See allergy contraindication Simvastatin. Last office visit 02/01/17 Please advise which medication patient should be taking?

## 2017-06-29 NOTE — Telephone Encounter (Signed)
Should be on Lipitor, not simvastatin. Thanks.

## 2017-06-29 NOTE — Telephone Encounter (Signed)
Left detailed message on voicemail to call back with info.

## 2017-06-30 ENCOUNTER — Encounter: Payer: Self-pay | Admitting: *Deleted

## 2017-06-30 NOTE — Telephone Encounter (Signed)
Voicemail is now full.  Letter mailed.

## 2017-08-21 ENCOUNTER — Telehealth: Payer: Self-pay

## 2017-08-21 NOTE — Telephone Encounter (Signed)
Here was the prev plan per lipid clinic.  "1. Hyperlipidemia - LDL above goal 70mg /dL given hx of ASCVD. Will stop simvastatin and start atorvastatin 20mg  daily. If pt is tolerating, will increase to 40mg  daily after a few weeks. Will check LFTs in 1 month to ensure they remain stable - previous elevation likely due to gallbladder issues. Will check lipid panel in 2 months. Plan to titrate atorvastatin to max tolerated dose and add Zetia if needed to bring LDL to goal. Pt will call with atorvastatin tolerability.  Pt signed informed consent for [facility] lipid registry.  Megan E. Supple, PharmD, CPP, BCACP"  She should get labs done- orders are in EMR per lipid clinic.  I'll defer.   Routed as FYI.

## 2017-08-21 NOTE — Telephone Encounter (Signed)
Pt left v/m wanting to get lipid lab testing; 12/28/17Ryan Dunn PA referred to lipid clinic in [place]; pt saw Dr Damita Dunnings 02/01/17 and he may discuss lipid with card or lipid clinic.Please advise.

## 2017-08-22 ENCOUNTER — Other Ambulatory Visit: Payer: Self-pay

## 2017-08-22 DIAGNOSIS — E7800 Pure hypercholesterolemia, unspecified: Secondary | ICD-10-CM

## 2017-08-22 NOTE — Telephone Encounter (Signed)
Thanks

## 2017-08-22 NOTE — Telephone Encounter (Signed)
Pt past due for lipid and liver profile from June 2018. Pt would like to have this at PCP office, Dr. Elsie Stain, along with her flu shot and will call his office to set this up. Order placed in [place].

## 2017-08-22 NOTE — Telephone Encounter (Signed)
F/u LFTs 1 month after starting atorvastatin 20mg  daily were stable but remained mildly elevated. Pt was to continue on 20mg  rather than increasing dose. Pt was scheduled for f/u lipid panel in June 2018 but did not show. Agree pt needs lipids scheduled. I can try to reach out to pt to have these scheduled, thanks for forwarding.

## 2017-09-05 ENCOUNTER — Other Ambulatory Visit (INDEPENDENT_AMBULATORY_CARE_PROVIDER_SITE_OTHER): Payer: Medicare HMO

## 2017-09-05 ENCOUNTER — Other Ambulatory Visit: Payer: Medicare HMO

## 2017-09-05 ENCOUNTER — Ambulatory Visit (INDEPENDENT_AMBULATORY_CARE_PROVIDER_SITE_OTHER): Payer: Medicare HMO

## 2017-09-05 DIAGNOSIS — E7800 Pure hypercholesterolemia, unspecified: Secondary | ICD-10-CM

## 2017-09-05 DIAGNOSIS — Z23 Encounter for immunization: Secondary | ICD-10-CM | POA: Diagnosis not present

## 2017-09-05 LAB — HEPATIC FUNCTION PANEL
ALBUMIN: 4.7 g/dL (ref 3.5–5.2)
ALT: 46 U/L — AB (ref 0–35)
AST: 41 U/L — ABNORMAL HIGH (ref 0–37)
Alkaline Phosphatase: 116 U/L (ref 39–117)
BILIRUBIN DIRECT: 0.2 mg/dL (ref 0.0–0.3)
TOTAL PROTEIN: 7.7 g/dL (ref 6.0–8.3)
Total Bilirubin: 1.1 mg/dL (ref 0.2–1.2)

## 2017-09-05 LAB — LIPID PANEL
CHOL/HDL RATIO: 4
CHOLESTEROL: 181 mg/dL (ref 0–200)
HDL: 48.9 mg/dL (ref >39.00)
LDL CALC: 98 mg/dL (ref 0–99)
NonHDL: 132.11
TRIGLYCERIDES: 173 mg/dL — AB (ref 0.0–149.0)
VLDL: 34.6 mg/dL (ref 0.0–40.0)

## 2017-09-08 ENCOUNTER — Telehealth: Payer: Self-pay | Admitting: Pharmacist

## 2017-09-08 MED ORDER — EZETIMIBE 10 MG PO TABS: 10.0000 mg | ORAL_TABLET | Freq: Every day | ORAL | 3 refills | Status: DC

## 2017-09-08 MED ORDER — ATORVASTATIN CALCIUM 20 MG PO TABS: 20.0000 mg | ORAL_TABLET | Freq: Every day | ORAL | 3 refills | Status: DC

## 2017-09-08 NOTE — Telephone Encounter (Signed)
Spoke with pt regarding lipid panel results. LDL improved on Lipitor 20mg  daily, LFTs remain mildly elevated but stable. Pt reports myalgias at night on her Lipitor20mg  daily. She previously tried CoQ10 in the past which helped with statin tolerability. Encouraged her to resume CoQ10 200mg  daily to see if this helps. She is agreeable to continuing her Lipitor 20mg  daily and to starting Zetia 10mg  daily to help bring LDL to goal < 70. She states her PCP Dr Damita Dunnings will check lipids and LFTs in 3 months and she prefers to have her labs drawn there.

## 2017-09-28 ENCOUNTER — Other Ambulatory Visit: Payer: Self-pay | Admitting: *Deleted

## 2017-09-28 NOTE — Telephone Encounter (Signed)
Faxed refill request. Zolpidem Mail [place] 90 day supplies Last office visit:   02/01/17 Last Filled:    90 tablet 1 05/02/2017  Please advise.

## 2017-09-29 MED ORDER — ZOLPIDEM TARTRATE 10 MG PO TABS: 10.0000 mg | ORAL_TABLET | Freq: Every evening | ORAL | 1 refills | Status: DC | PRN

## 2017-09-29 NOTE — Telephone Encounter (Signed)
Printed.  Thanks.  

## 2017-09-29 NOTE — Telephone Encounter (Signed)
Faxed to [employer]

## 2017-10-02 ENCOUNTER — Other Ambulatory Visit: Payer: Self-pay | Admitting: *Deleted

## 2017-10-02 NOTE — Telephone Encounter (Signed)
Received faxed refill request from [facility] for Alprazolam Last refill 05/02/17 #270/1 Last office visit 02/01/17

## 2017-10-03 MED ORDER — ALPRAZOLAM 1 MG PO TABS: ORAL_TABLET | ORAL | 1 refills | Status: DC

## 2017-10-03 NOTE — Telephone Encounter (Signed)
Patient advised.  Faxed Rx to [facility].

## 2017-10-03 NOTE — Telephone Encounter (Signed)
This shouldn't be due for another month.  Printed with fill on/after date.  Thanks.

## 2017-10-06 ENCOUNTER — Other Ambulatory Visit: Payer: Self-pay | Admitting: *Deleted

## 2018-01-11 ENCOUNTER — Other Ambulatory Visit: Payer: Self-pay | Admitting: Physician Assistant

## 2018-01-11 ENCOUNTER — Other Ambulatory Visit: Payer: Self-pay | Admitting: Cardiovascular Disease

## 2018-01-15 ENCOUNTER — Other Ambulatory Visit: Payer: Self-pay | Admitting: Cardiovascular Disease

## 2018-01-15 MED ORDER — EZETIMIBE 10 MG PO TABS: 10.0000 mg | ORAL_TABLET | Freq: Every day | ORAL | 0 refills | Status: DC

## 2018-01-15 MED ORDER — ATORVASTATIN CALCIUM 20 MG PO TABS: 20.0000 mg | ORAL_TABLET | Freq: Every day | ORAL | 0 refills | Status: DC

## 2018-01-15 MED ORDER — NITROGLYCERIN 0.4 MG SL SUBL: 0.4000 mg | SUBLINGUAL_TABLET | SUBLINGUAL | 0 refills | Status: DC | PRN

## 2018-01-15 MED ORDER — ISOSORBIDE MONONITRATE ER 60 MG PO TB24: 60.0000 mg | ORAL_TABLET | Freq: Every day | ORAL | 0 refills | Status: DC

## 2018-01-15 MED ORDER — PANTOPRAZOLE SODIUM 40 MG PO TBEC: 40.0000 mg | DELAYED_RELEASE_TABLET | Freq: Every day | ORAL | 0 refills | Status: DC

## 2018-01-15 MED ORDER — METOPROLOL TARTRATE 25 MG PO TABS: 25.0000 mg | ORAL_TABLET | Freq: Two times a day (BID) | ORAL | 0 refills | Status: DC

## 2018-01-15 NOTE — Telephone Encounter (Signed)
°*  STAT* If patient is at the pharmacy, call can be transferred to refill team.   1. Which medications need to be refilled? (please list name of each medication and dose if known)   Isosorbide 60 mg po q d   Zetia 10 mg po q d   Atorvastatin 20 mg po q d   Pantoprazole 40 mg po q d   Metoprolol 25 mg po BID  Nitrostat 0.4 mg sl q 5 min prn   2. Which pharmacy/location (including street and city if local pharmacy) is medication to be sent to? [facility]   3. Do they need a 30 day or 90 day supply? Andrea Webster

## 2018-01-17 ENCOUNTER — Telehealth: Payer: Self-pay | Admitting: Cardiovascular Disease

## 2018-01-17 MED ORDER — METOPROLOL TARTRATE 25 MG PO TABS: 25.0000 mg | ORAL_TABLET | Freq: Two times a day (BID) | ORAL | 3 refills | Status: DC

## 2018-01-17 MED ORDER — ISOSORBIDE MONONITRATE ER 60 MG PO TB24: 60.0000 mg | ORAL_TABLET | Freq: Every day | ORAL | 3 refills | Status: DC

## 2018-01-17 MED ORDER — PANTOPRAZOLE SODIUM 40 MG PO TBEC: 40.0000 mg | DELAYED_RELEASE_TABLET | Freq: Every day | ORAL | 3 refills | Status: DC

## 2018-01-17 NOTE — Telephone Encounter (Signed)
°*  STAT* If patient is at the pharmacy, call can be transferred to refill team.   1. Which medications need to be refilled? (please list name of each medication and dose if known)  Isosorbide  metoprolol  Pantoprazole  2. Which pharmacy/location (including street and city if local pharmacy) is medication to be sent to? [facility]  3. Do they need a 30 day or 90 day supply? 90 day

## 2018-02-07 ENCOUNTER — Other Ambulatory Visit: Payer: Self-pay | Admitting: Family Medicine

## 2018-02-07 DIAGNOSIS — R739 Hyperglycemia, unspecified: Secondary | ICD-10-CM

## 2018-02-07 DIAGNOSIS — I2510 Atherosclerotic heart disease of native coronary artery without angina pectoris: Secondary | ICD-10-CM

## 2018-02-08 ENCOUNTER — Ambulatory Visit: Payer: Medicare HMO

## 2018-02-08 ENCOUNTER — Encounter: Payer: Medicare HMO | Admitting: Family Medicine

## 2018-02-15 ENCOUNTER — Ambulatory Visit (INDEPENDENT_AMBULATORY_CARE_PROVIDER_SITE_OTHER): Payer: Medicare HMO | Admitting: Family Medicine

## 2018-02-15 ENCOUNTER — Encounter: Payer: Self-pay | Admitting: Family Medicine

## 2018-02-15 ENCOUNTER — Ambulatory Visit (INDEPENDENT_AMBULATORY_CARE_PROVIDER_SITE_OTHER): Payer: Medicare HMO

## 2018-02-15 VITALS — BP 130/78 | HR 71 | Temp 98.4°F | Ht 59.5 in | Wt 224.0 lb

## 2018-02-15 DIAGNOSIS — I2510 Atherosclerotic heart disease of native coronary artery without angina pectoris: Secondary | ICD-10-CM | POA: Diagnosis not present

## 2018-02-15 DIAGNOSIS — R21 Rash and other nonspecific skin eruption: Secondary | ICD-10-CM | POA: Diagnosis not present

## 2018-02-15 DIAGNOSIS — Z0000 Encounter for general adult medical examination without abnormal findings: Secondary | ICD-10-CM

## 2018-02-15 DIAGNOSIS — G4700 Insomnia, unspecified: Secondary | ICD-10-CM | POA: Diagnosis not present

## 2018-02-15 DIAGNOSIS — E785 Hyperlipidemia, unspecified: Secondary | ICD-10-CM

## 2018-02-15 DIAGNOSIS — R739 Hyperglycemia, unspecified: Secondary | ICD-10-CM | POA: Diagnosis not present

## 2018-02-15 DIAGNOSIS — F410 Panic disorder [episodic paroxysmal anxiety] without agoraphobia: Secondary | ICD-10-CM

## 2018-02-15 DIAGNOSIS — R39198 Other difficulties with micturition: Secondary | ICD-10-CM

## 2018-02-15 DIAGNOSIS — Z7189 Other specified counseling: Secondary | ICD-10-CM

## 2018-02-15 LAB — COMPREHENSIVE METABOLIC PANEL
ALK PHOS: 105 U/L (ref 39–117)
ALT: 40 U/L — AB (ref 0–35)
AST: 40 U/L — ABNORMAL HIGH (ref 0–37)
Albumin: 4.4 g/dL (ref 3.5–5.2)
BILIRUBIN TOTAL: 1 mg/dL (ref 0.2–1.2)
BUN: 8 mg/dL (ref 6–23)
CO2: 31 meq/L (ref 19–32)
Calcium: 9.6 mg/dL (ref 8.4–10.5)
Chloride: 97 mEq/L (ref 96–112)
Creatinine, Ser: 0.77 mg/dL (ref 0.40–1.20)
GFR: 81.16 mL/min (ref >60.00)
GLUCOSE: 119 mg/dL — AB (ref 70–99)
Potassium: 4.5 mEq/L (ref 3.5–5.1)
SODIUM: 135 meq/L (ref 135–145)
TOTAL PROTEIN: 7.5 g/dL (ref 6.0–8.3)

## 2018-02-15 LAB — POC URINALSYSI DIPSTICK (AUTOMATED)
BILIRUBIN UA: NEGATIVE
GLUCOSE UA: NEGATIVE
KETONES UA: NEGATIVE
Leukocytes, UA: NEGATIVE
Nitrite, UA: NEGATIVE
PH UA: 6 (ref 5.0–8.0)
Protein, UA: NEGATIVE
RBC UA: NEGATIVE
Spec Grav, UA: 1.015 (ref 1.010–1.025)
Urobilinogen, UA: 0.2 E.U./dL

## 2018-02-15 LAB — LIPID PANEL
CHOL/HDL RATIO: 3
Cholesterol: 170 mg/dL (ref 0–200)
HDL: 49.5 mg/dL (ref >39.00)
LDL Cholesterol: 91 mg/dL (ref 0–99)
NonHDL: 120.15
Triglycerides: 145 mg/dL (ref 0.0–149.0)
VLDL: 29 mg/dL (ref 0.0–40.0)

## 2018-02-15 LAB — HEMOGLOBIN A1C: HEMOGLOBIN A1C: 5.9 % (ref 4.6–6.5)

## 2018-02-15 MED ORDER — NYSTATIN 100000 UNIT/GM EX POWD: Freq: Three times a day (TID) | CUTANEOUS | 0 refills | Status: DC

## 2018-02-15 MED ORDER — CLOPIDOGREL BISULFATE 75 MG PO TABS: ORAL_TABLET | ORAL | Status: DC

## 2018-02-15 MED ORDER — FLUCONAZOLE 150 MG PO TABS
150.0000 mg | ORAL_TABLET | ORAL | 0 refills | Status: DC
Start: 2018-02-15 — End: 2018-03-16

## 2018-02-15 MED ORDER — FLUCONAZOLE 150 MG PO TABS: 150.0000 mg | ORAL_TABLET | Freq: Once | ORAL | 0 refills | Status: DC

## 2018-02-15 NOTE — Patient Instructions (Addendum)
You can call for a mammogram at [facility] at [facility].  [place]  Use nystatin powder- put that on 3 times a day.  Take diflucan weekly in the meantime.  Check with cardiology about me rx'ing your meds.   Take care.  Glad to see you.  Update me as needed.

## 2018-02-15 NOTE — Patient Instructions (Signed)
Andrea Webster , Thank you for taking time to come for your Medicare Wellness Visit. I appreciate your ongoing commitment to your health goals. Please review the following plan we discussed and let me know if I can assist you in the future.   These are the goals we discussed: Goals    . Increase physical activity     Starting 02/15/2018, I will continue to take medications as prescribed and to keep appointments with PCP with scheduled.        This is a list of the screening recommended for you and due dates:  Health Maintenance  Topic Date Due  . Mammogram  11/20/2018*  . Stool Blood Test  11/20/2018*  . Colon Cancer Screening  02/15/2029*  . Tetanus Vaccine  04/12/2023  . Flu Shot  Completed  .  Hepatitis C: One time screening is recommended by Center for Disease Control  (CDC) for  adults born from [birth year] through 1964.   Completed  . HIV Screening  Completed  *Topic was postponed. The date shown is not the original due date.   Preventive Care for Adults  A healthy lifestyle and preventive care can promote health and wellness. Preventive health guidelines for adults include the following key practices.  . A routine yearly physical is a good way to check with your health care provider about your health and preventive screening. It is a chance to share any concerns and updates on your health and to receive a thorough exam.  . Visit your dentist for a routine exam and preventive care every 6 months. Brush your teeth twice a day and floss once a day. Good oral hygiene prevents tooth decay and gum disease.  . The frequency of eye exams is based on your age, health, family medical history, use  of contact lenses, and other factors. Follow your health care provider's recommendations for frequency of eye exams.  . Eat a healthy diet. Foods like vegetables, fruits, whole grains, low-fat dairy products, and lean protein foods contain the nutrients you need without too many calories. Decrease your  intake of foods high in solid fats, added sugars, and salt. Eat the right amount of calories for you. Get information about a proper diet from your health care provider, if necessary.  . Regular physical exercise is one of the most important things you can do for your health. Most adults should get at least 150 minutes of moderate-intensity exercise (any activity that increases your heart rate and causes you to sweat) each week. In addition, most adults need muscle-strengthening exercises on 2 or more days a week.  Silver Sneakers may be a benefit available to you. To determine eligibility, you may visit the website: www.silversneakers.com or contact program at 603-389-1717 Mon-Fri between 8AM-8PM.   . Maintain a healthy weight. The body mass index (BMI) is a screening tool to identify possible weight problems. It provides an estimate of body fat based on height and weight. Your health care provider can find your BMI and can help you achieve or maintain a healthy weight.   For adults 20 years and older: ? A BMI below 18.5 is considered underweight. ? A BMI of 18.5 to 24.9 is normal. ? A BMI of 25 to 29.9 is considered overweight. ? A BMI of 30 and above is considered obese.   . Maintain normal blood lipids and cholesterol levels by exercising and minimizing your intake of saturated fat. Eat a balanced diet with plenty of fruit and vegetables. Blood tests  for lipids and cholesterol should begin at age 85 and be repeated every 5 years. If your lipid or cholesterol levels are high, you are over 50, or you are at high risk for heart disease, you may need your cholesterol levels checked more frequently. Ongoing high lipid and cholesterol levels should be treated with medicines if diet and exercise are not working.  . If you smoke, find out from your health care provider how to quit. If you do not use tobacco, please do not start.  . If you choose to drink alcohol, please do not consume more than 2  drinks per day. One drink is considered to be 12 ounces (355 mL) of beer, 5 ounces (148 mL) of wine, or 1.5 ounces (44 mL) of liquor.  . If you are 33-59 years old, ask your health care provider if you should take aspirin to prevent strokes.  . Use sunscreen. Apply sunscreen liberally and repeatedly throughout the day. You should seek shade when your shadow is shorter than you. Protect yourself by wearing long sleeves, pants, a wide-brimmed hat, and sunglasses year round, whenever you are outdoors.  . Once a month, do a whole body skin exam, using a mirror to look at the skin on your back. Tell your health care provider of new moles, moles that have irregular borders, moles that are larger than a pencil eraser, or moles that have changed in shape or color.

## 2018-02-15 NOTE — Progress Notes (Signed)
D/w patient [id number] for colon cancer screening, including IFOB vs. colonoscopy.  Risks and benefits of both were discussed and patient voiced understanding.  Pt elects WYB:RKVT. Kit given to patient.  Mammogram d/w pt.  See AVS.  She can call for an appointment.   Shingrix back ordered.   D/w pt.   DXA not due.   Advance directive  D/w pt.  [name] designated if patient were incapacitated.   U/a d/w pt. No burning with urination.  Rash noted for a few months.  Itching.  B groin.  Reddish.  Tried hydrocortisone, desitin, antifungals, triple antibiotic ointment.    Mood d/w pt.  Intolerant of mult meds.  Taking xanax tid prn with some relief.  Mult life stressors noted.  She is trying to get custody of her brother, who required mult brain surgeries.  No SI/HI.    Elevated Cholesterol: Using medications without problems: Muscle aches: yes, see below.   Diet compliance: "I'm trying."  Encouraged healthy options.   Exercise: limited recently.   She has some myalgias.  She has seen cardiology in the past. She is taking plavix QOD.   Labs pending.    CAD.   Using medication without problems or lightheadedness: occ lightheaded but she attributed some of that to ongoing life stressors.   Chest pain with exertion:no Edema:no Short of breath:no No NTG use in ~7 months.    Insomnia.  See above.  Taking ambien for imsomnia.  We talked about trying to limit stressors and triggers prior to bed.    PMH and SH reviewed  Meds, vitals, and allergies reviewed.   ROS: Per HPI unless specifically indicated in ROS section   GEN: nad, alert and oriented HEENT: mucous membranes moist NECK: supple w/o LA CV: rrr. PULM: ctab, no inc wob ABD: soft, +bs EXT: no edema SKIN: chaperoned exam with fungal infection in the groin.

## 2018-02-15 NOTE — Progress Notes (Signed)
Subjective:   Andrea Webster is a 62 y.o. female who presents for Medicare Annual (Subsequent) preventive examination.  Review of Systems:  N/A Cardiac Risk Factors include: advanced age (>18men, >79 women);obesity (BMI >30kg/m2);dyslipidemia;hypertension     Objective:     Vitals: BP 130/78 (BP Location: Right Arm, Patient Position: Sitting, Cuff Size: Large)   Pulse 71   Temp 98.4 F (36.9 C) (Oral)   Ht 4' 11.5" (1.511 m) Comment: no shoes  Wt 224 lb (101.6 kg)   SpO2 95%   BMI 44.49 kg/m   Body mass index is 44.49 kg/m.  Advanced Directives 02/15/2018 01/23/2017 03/30/2016 03/24/2016 04/30/2015  Does Patient Have a Medical Advance Directive? No No No No No  Would patient like information on creating a medical advance directive? No - Patient declined - No - patient declined information No - patient declined information Yes - Spiritual care consult ordered    Tobacco Social History   Tobacco Use  Smoking Status Former Smoker  . Packs/day: 0.33  . Years: 20.00  . Pack years: 6.60  . Types: Cigarettes  . Last attempt to quit: 02/13/2015  . Years since quitting: 3.0  Smokeless Tobacco Never Used     Counseling given: No   Clinical Intake:  Pre-visit preparation completed: Yes  Pain : No/denies pain Pain Score: 0-No pain     Nutritional Status: BMI > 30  Obese Nutritional Risks: None Diabetes: No  How often do you need to have someone help you when you read instructions, pamphlets, or other written materials from your doctor or pharmacy?: 1 - Never What is the last grade level you completed in school?: 12th grade  Interpreter Needed?: No  Comments: pt lives with significant other Information entered by :: LPinson, LPN  Past Medical History:  Diagnosis Date  . Anxiety   . Arthritis   . Baker's cyst    x 2  . CAD (coronary artery disease)    a. s/p MI & PTCA;  b. Ex MV: basal inflat, mid inflat, apical inf, apical lat ischemia;  c. 04/2015 PCI: LM nl, LAD  60p (FFR 0.83), D1 60ost, LCX 50p, 68m (2.75x15 Xience Alpine DES), OM1/2/3 min irregs, RCA 147m CTO.  . Depression    intolerant of paxil, wellbutrin, zoloft  . GERD (gastroesophageal reflux disease)   . Hayfever   . headaches   . Heart murmur   . High cholesterol   . Hypertension   . Insomnia   . Myocardial infarction ([place])   . RLS (restless legs syndrome)   . Sciatica   . Smoker    a. quit 01/2014.   Past Surgical History:  Procedure Laterality Date  . ANGIOPLASTY  1997  . APPENDECTOMY  1978   open  . [place]  . CARDIAC CATHETERIZATION N/A 04/30/2015   Procedure: Left Heart Cath;  Surgeon: Wellington Hampshire, MD;  Location: [place] [facility];  Service: Cardiovascular;  Laterality: N/A;  . CARPAL TUNNEL RELEASE    . CESAREAN SECTION  1980  . CESAREAN SECTION  1984  . CHOLECYSTECTOMY N/A 03/30/2016   Procedure: LAPAROSCOPIC CHOLECYSTECTOMY WITH INTRAOPERATIVE CHOLANGIOGRAM;  Surgeon: Jules Husbands, MD;  Location: [facility] OR[facility];  Service: General;  Laterality: N/A;  [facility] [place]  [facility]  . HERNIA REPAIR  11/2016  [facility]; Dr. Gaynelle Arabian  . VAGINAL HYSTERECTOMY     Family History  Problem Relation Age of Onset  . Heart disease Mother   . Diabetes Mother   . Hypertension Mother   . Heart attack Mother   . Alcohol abuse Father   . Heart disease Father   . Heart attack Father   . Breast cancer Maternal Grandmother   . Colon cancer Neg Hx   . Bladder Cancer Neg Hx   . Kidney cancer Neg Hx    Social History   Socioeconomic History  . Marital status: Widowed    Spouse name: Not on file  . Number of children: Not on file  . Years of education: Not on file  . Highest education level: Not on file  Occupational History  . Not on file  Social Needs  . Financial resource strain: Not on file  . Food insecurity:      Worry: Not on file    Inability: Not on file  . Transportation needs:    Medical: Not on file    Non-medical: Not on file  Tobacco Use  . Smoking status: Former Smoker    Packs/day: 0.33    Years: 20.00    Pack years: 6.60    Types: Cigarettes    Last attempt to quit: 02/13/2015    Years since quitting: 3.0  . Smokeless tobacco: Never Used  Substance and Sexual Activity  . Alcohol use: Yes    Alcohol/week: 0.0 oz    Comment: rare  . Drug use: No  . Sexual activity: Not on file  Lifestyle  . Physical activity:    Days per week: Not on file    Minutes per session: Not on file  . Stress: Not on file  Relationships  . Social connections:    Talks on phone: Not on file    Gets together: Not on file    Attends religious service: Not on file    Active member of club or organization: Not on file    Attends meetings of clubs or organizations: Not on file    Relationship status: Not on file  Other Topics Concern  . Not on file  Social History Narrative   From [place], to [place] 2011   Widow as of 2001   1 son prev incarcerated, out as of 2014   1 daughter    Outpatient Encounter Medications as of 02/15/2018  Medication Sig  . ALPRAZolam (XANAX) 1 MG tablet TAKE 1 TABLET THREE TIMES DAILY AS NEEDED FOR ANXIETY  . aspirin 81 MG chewable tablet Chew 81 mg by mouth daily.  Marland Kitchen atorvastatin (LIPITOR) 20 MG tablet Take 1 tablet (20 mg total) by mouth daily.  . clopidogrel (PLAVIX) 75 MG tablet TAKE 1 TABLET EVERY DAY WITH BREAKFAST  ( NEED MD APPOINTMENT FOR REFILLS  )  . ezetimibe (ZETIA) 10 MG tablet Take 1 tablet (10 mg total) by mouth daily.  . fluticasone (FLONASE) 50 MCG/ACT nasal spray USE TWO SPRAY(S) IN EACH NOSTRIL ONCE DAILY  . isosorbide mononitrate (IMDUR) 60 MG 24 hr tablet Take 1 tablet (60 mg total) by mouth daily.  . metoprolol tartrate (LOPRESSOR) 25 MG tablet Take 1 tablet (25 mg total) by mouth 2 (two) times daily.  . Multiple Vitamin (MULTIVITAMIN) tablet  Take 1 tablet by mouth daily.    . nitroGLYCERIN (NITROSTAT) 0.4 MG SL tablet Place 1 tablet (0.4 mg total) under the tongue every 5 (five) minutes as needed for  chest pain.  . pantoprazole (PROTONIX) 40 MG tablet Take 1 tablet (40 mg total) by mouth daily.  Marland Kitchen senna (SENOKOT) 8.6 MG tablet Take 1 tablet by mouth as needed for constipation.   . VENTOLIN HFA 108 (90 Base) MCG/ACT inhaler INHALE 2 PUFFS EVERY 6  HOURS AS NEEDED FOR WHEEZING OR SHORTNESS OF BREATH.  Marland Kitchen zolpidem (AMBIEN) 10 MG tablet Take 1 tablet (10 mg total) at bedtime as needed by mouth. for sleep  . [DISCONTINUED] mirabegron ER (MYRBETRIQ) 50 MG TB24 tablet Take 1 tablet (50 mg total) by mouth daily. (Patient not taking: Reported on 02/15/2018)   No facility-administered encounter medications on file as of 02/15/2018.     Activities of Daily Living In your present state of health, do you have any difficulty performing the following activities: 02/15/2018  Hearing? N  Vision? N  Difficulty concentrating or making decisions? N  Walking or climbing stairs? N  Dressing or bathing? N  Doing errands, shopping? N  Preparing Food and eating ? N  Using the Toilet? N  In the past six months, have you accidently leaked urine? Y  Comment bladder spasms  Do you have problems with loss of bowel control? N  Managing your Medications? N  Managing your Finances? N  Housekeeping or managing your Housekeeping? N  Some recent data might be hidden    Patient Care Team: Tonia Ghent, MD as PCP - General (Family Medicine) Wellington Hampshire, MD as Consulting Physician (Cardiology) Bonner Puna, MD as Referring Physician [facility])    Assessment:   This is a routine wellness examination for [place].   Hearing Screening   125Hz  250Hz  500Hz  1000Hz  2000Hz  3000Hz  4000Hz  6000Hz  8000Hz   Right ear:   40 40 40  40    Left ear:   40 40 40  40      Visual Acuity Screening   Right eye Left eye Both eyes  Without correction:     With  correction: 20/30-2 20/25-2 20/25-1     Exercise Activities and Dietary recommendations Current Exercise Habits: The patient does not participate in regular exercise at present, Exercise limited by: None identified  Goals    . Increase physical activity     Starting 02/15/2018, I will continue to take medications as prescribed and to keep appointments with PCP with scheduled.        Fall Risk Fall Risk  02/15/2018 01/23/2017 06/30/2014  Falls in the past year? No No Yes   Depression Screen PHQ 2/9 Scores 02/15/2018 01/23/2017 06/30/2014  PHQ - 2 Score 3 0 0  PHQ- 9 Score 5 - -     Cognitive Function MMSE - Mini Mental State Exam 02/15/2018 01/23/2017  Orientation to time 5 5  Orientation to Place 5 5  Registration 3 3  Attention/ Calculation 0 0  Recall 3 3  Language- name 2 objects 0 0  Language- repeat 1 1  Language- follow 3 step command 3 3  Language- read & follow direction 0 0  Write a sentence 0 0  Copy design 0 0  Total score 20 20     PLEASE NOTE: A Mini-Cog screen was completed. Maximum score is 20. A value of 0 denotes this part of [place]stein MMSE was not completed or the patient failed this part of the Mini-Cog screening.   Mini-Cog Screening Orientation to Time - Max 5 pts Orientation to Place - Max 5 pts Registration - Max 3 pts Recall - Max 3 pts  Language Repeat - Max 1 pts Language Follow 3 Step Command - Max 3 pts     Immunization History  Administered Date(s) Administered  . Influenza Split 11/01/2011  . Influenza, Seasonal, Injecte, Preservative Fre 11/23/2012  . Influenza,inj,Quad PF,6+ Mos 08/29/2013, 10/03/2014, 08/26/2015, 08/08/2016, 09/05/2017  . Pneumococcal Polysaccharide-23 08/26/2015  . Tdap 04/11/2013    Screening Tests Health Maintenance  Topic Date Due  . MAMMOGRAM  11/20/2018 (Originally 04/26/2015)  . COLON CANCER SCREENING ANNUAL FOBT  11/20/2018 (Originally 09/20/2007)  . COLONOSCOPY  02/15/2029 (Originally 09/20/2007)  .  TETANUS/TDAP  04/12/2023  . INFLUENZA VACCINE  Completed  . Hepatitis C Screening  Completed  . HIV Screening  Completed       Plan:     I have personally reviewed, addressed, and noted the following in the patient's chart:  A. Medical and social history B. Use of alcohol, tobacco or illicit drugs  C. Current medications and supplements D. Functional ability and status E.  Nutritional status F.  Physical activity G. Advance directives H. List of other physicians I.  Hospitalizations, surgeries, and ER visits in previous 12 months J.  [place] to include hearing, vision, cognitive, depression L. Referrals and appointments - none  In addition, I have reviewed and discussed with patient certain preventive protocols, quality metrics, and best practice recommendations. A written personalized care plan for preventive services as well as general preventive health recommendations were provided to patient.  See attached scanned questionnaire for additional information.   Signed,   Lindell Noe, MHA, BS, LPN Health Coach

## 2018-02-15 NOTE — Progress Notes (Signed)
PCP notes:   Health maintenance:  Mammogram - PCP please address at next appt Colon cancer screening - FOBT kit given to patient  Abnormal screenings:   Depression score: 5 Depression screen [facility] 2/9 02/15/2018 01/23/2017 06/30/2014  Decreased Interest 3 0 0  Down, Depressed, Hopeless 0 0 0  PHQ - 2 Score 3 0 0  Altered sleeping 2 - -  Tired, decreased energy 0 - -  Change in appetite 0 - -  Feeling bad or failure about yourself  0 - -  Trouble concentrating 0 - -  Moving slowly or fidgety/restless 0 - -  Suicidal thoughts 0 - -  PHQ-9 Score 5 - -  Difficult doing work/chores Not difficult at all - -   Patient concerns:   Patient has rash to groin area and concerns with abnormal urination. PCP notified. UA completed.   Patient has requested PCP take care of all medication refills in the future.   Nurse concerns:  None  Next PCP appt:   02/15/18 @ 1400  I reviewed health advisor's note, was available for consultation on the day of service listed in this note, and agree with documentation and plan. Elsie Stain, MD.

## 2018-02-16 DIAGNOSIS — R21 Rash and other nonspecific skin eruption: Secondary | ICD-10-CM | POA: Insufficient documentation

## 2018-02-16 NOTE — Assessment & Plan Note (Signed)
She is on Lipitor and Zetia.  I will defer to cardiology and pharmacy about this.  She does have some myalgias.  She is going to check with them about options.  She was asking about having me prescribe all of her medications.  I am okay doing this assuming cardiology also agrees.  I appreciate the help of all involved. Diet and exercise d/w pt.

## 2018-02-16 NOTE — Assessment & Plan Note (Signed)
Taking ambien for imsomnia.  We talked about trying to limit stressors and triggers prior to bed.  No parasomnias.  No adverse effect of medication.  Continue as is.  She agrees.

## 2018-02-16 NOTE — Assessment & Plan Note (Signed)
Likely superficial fungal infection.  Patient can use nystatin topically and take Diflucan once a week.  See med orders.  Discussed with patient.  She agrees.

## 2018-02-16 NOTE — Assessment & Plan Note (Signed)
D/w patient GI:[id number] for colon cancer screening, including IFOB vs. colonoscopy.  Risks and benefits of both were discussed and patient voiced understanding.  Pt elects XVE:ZBMZ. Kit given to patient.  Mammogram d/w pt.  See AVS.  She can call for an appointment.   Shingrix back ordered.   D/w pt.   DXA not due.   Advance directive  D/w pt.  [name] designated if patient were incapacitated.

## 2018-02-16 NOTE — Assessment & Plan Note (Signed)
Advance directive  D/w pt.  Timothy Goins designated if patient were incapacitated ?

## 2018-02-16 NOTE — Assessment & Plan Note (Signed)
Mood d/w pt.  Intolerant of mult meds.  Taking xanax tid prn with some relief.  Mult life stressors noted.  She is trying to get custody of her brother, who required mult brain surgeries.  No SI/HI.  Would continue Xanax as needed.  Routine cautions given.

## 2018-02-16 NOTE — Assessment & Plan Note (Signed)
Discussed with patient about risk factor reduction.  No nitroglycerin use.  No chest pain.  Okay for outpatient follow-up.  She will see cardiology in the relatively near future.  I appreciate the help of all involved. >25 minutes spent in face to face time with patient, >50% spent in counselling or coordination of care, discussing diet, exercise, insomnia, mood, rash, etc.

## 2018-03-01 ENCOUNTER — Ambulatory Visit: Payer: Medicare HMO | Admitting: Cardiovascular Disease

## 2018-03-14 ENCOUNTER — Encounter: Payer: Self-pay | Admitting: Family Medicine

## 2018-03-16 ENCOUNTER — Encounter: Payer: Self-pay | Admitting: Family Medicine

## 2018-03-16 ENCOUNTER — Ambulatory Visit (INDEPENDENT_AMBULATORY_CARE_PROVIDER_SITE_OTHER): Payer: Medicare HMO | Admitting: Family Medicine

## 2018-03-16 DIAGNOSIS — L02429 Furuncle of limb, unspecified: Secondary | ICD-10-CM | POA: Diagnosis not present

## 2018-03-16 MED ORDER — DOXYCYCLINE HYCLATE 100 MG PO TABS: 100.0000 mg | ORAL_TABLET | Freq: Two times a day (BID) | ORAL | 0 refills | Status: DC

## 2018-03-16 NOTE — Progress Notes (Signed)
Skin lesions.  Started about 1 week ago.  No fevers, no vomiting.  Tender locally.  Had been using aloe and a drawing salve.  Some drained and resolved, 2 of the 5 are left, 1 on each side remain having drained some.   Meds, vitals, and allergies reviewed.   ROS: Per HPI unless specifically indicated in ROS section   nad ncat Neck supple, no LA rrr ctab 1 boil in each axilla, both having drained some already Also with 3 apparently resolving/drained lesions that have healed over.

## 2018-03-16 NOTE — Patient Instructions (Signed)
Warm compresses, start doxy (sunburn caution), update me as needed.  Take care.  Glad to see you.

## 2018-03-18 DIAGNOSIS — L02429 Furuncle of limb, unspecified: Secondary | ICD-10-CM | POA: Insufficient documentation

## 2018-03-18 NOTE — Assessment & Plan Note (Signed)
It looks like she developed this after shaving.  It does not appear to be related to hidradenitis suppurativa.   Don't shave until resolved.   Change razors when shaving.   Use antibacterial soap prior to shaving in the future.   Start doxycycline.  Update me as needed.  Neither remaining lesion appears to need incision and drainage at this time as they are already draining some.  Continue warm compresses. Okay for outpatient f/u.

## 2018-03-21 ENCOUNTER — Other Ambulatory Visit: Payer: Self-pay | Admitting: Family Medicine

## 2018-03-21 NOTE — Telephone Encounter (Signed)
Electronic refill request Last refill 09/29/17 #90/1

## 2018-03-21 NOTE — Telephone Encounter (Signed)
Sent. Thanks.   

## 2018-04-02 ENCOUNTER — Other Ambulatory Visit: Payer: Self-pay | Admitting: Family Medicine

## 2018-04-02 NOTE — Telephone Encounter (Signed)
Electronic refill request Last office visit 03/16/18 Last refill 10/21/17 #270/1

## 2018-04-03 NOTE — Telephone Encounter (Signed)
Sent. Thanks.   

## 2018-04-17 ENCOUNTER — Other Ambulatory Visit: Payer: Self-pay | Admitting: Cardiovascular Disease

## 2018-04-30 ENCOUNTER — Other Ambulatory Visit: Payer: Self-pay | Admitting: Family Medicine

## 2018-04-30 ENCOUNTER — Other Ambulatory Visit: Payer: Self-pay | Admitting: Cardiovascular Disease

## 2018-08-30 ENCOUNTER — Ambulatory Visit (INDEPENDENT_AMBULATORY_CARE_PROVIDER_SITE_OTHER): Payer: Medicare HMO

## 2018-08-30 DIAGNOSIS — Z23 Encounter for immunization: Secondary | ICD-10-CM

## 2018-09-24 ENCOUNTER — Other Ambulatory Visit: Payer: Self-pay | Admitting: Family Medicine

## 2018-09-25 NOTE — Telephone Encounter (Signed)
Electronic refill request. Alprazolam Last office visit:   03/16/18 Acute  CPE March 2019 Last Filled:    270 tablet 1 04/03/2018    Electronic refill request. Ambien Last office visit:   03/16/18 Acute  CPE March 2019 Last Filled:    90 tablet 1 03/21/2018  Please advise.

## 2018-09-26 NOTE — Telephone Encounter (Signed)
Sent. Thanks.   

## 2018-10-21 ENCOUNTER — Other Ambulatory Visit: Payer: Self-pay | Admitting: Cardiovascular Disease

## 2018-11-05 ENCOUNTER — Ambulatory Visit: Payer: Medicare HMO | Admitting: Physician Assistant

## 2018-11-15 ENCOUNTER — Other Ambulatory Visit: Payer: Self-pay | Admitting: Cardiovascular Disease

## 2018-11-16 NOTE — Telephone Encounter (Signed)
Please advise if ok to refill Atorvastatin.  Pt has not been seen since 10/2016. Pt has upcoming appointment coming up with PA.

## 2018-11-19 ENCOUNTER — Other Ambulatory Visit: Payer: Self-pay

## 2018-11-22 ENCOUNTER — Ambulatory Visit: Payer: Medicare HMO | Admitting: Physician Assistant

## 2018-11-26 NOTE — Progress Notes (Deleted)
Cardiology Office Note Date:  11/26/2018  Patient ID:  Andrea Webster, Andrea Webster 1956/10/28, MRN [id number] PCP:  Tonia Ghent, MD  Cardiologist:  Dr. Fletcher Anon, MD  ***refresh   Chief Complaint: ***  History of Present Illness: Andrea Webster is a 63 y.o. female with history of CAD s/p MI at age 56 with remote PTCA performed of unknown vessel, strong family history of CAD, prior tobacco abuse quitting in 01/2015, HLD, HTN, GERD, and anxiety who presents for routine follow up of her CAD.  Most recent cardiac cath in 04/2015 in the setting an intermediate risk stress test for chest pain showed a chronic total occlusion of the RCA, severe mid LCx disease with a 90% stenosis, and moderate proximal LAD disease. FFR was performed within the LAD stenosis and was found to not be significant at 0.83. The LCx was felt to be the culprit vessel and was successfully treated with PCI/DES. Normal EF by noninvasive testing via stress test as above in 04/2015.  She was seen in the ED and 2017 for lower chest pain/abdominal pain and found to have cholecystitis, ultimately undergoing cholecystectomy in 03/2016 with postoperative course being complicated by incisional hernia.  She was last seen in the office in 10/2016 for routine follow-up and was doing well.  She was started in the process for possible gastric sleeve surgery at that time.  In that setting, she underwent Lexiscan Myoview in 10/2016 which showed a small mild region of ischemia noted in the mid to apical lateral wall, unable to definitively exclude attenuation artifact given high uptake in the inferior septal wall, EF 83%, no EKG changes concerning for ischemia, low risk scan given very small mild perfusion defect.  Echo in 11/2016 which showed an EF of 60 to 65%, mild LVH, no regional wall motion abnormalities, grade 1 diastolic dysfunction, calcified mitral annulus, mildly dilated left atrium, trivial pericardial effusion was noted.  She ultimately underwent  repair of her ventral hernia later in 11/2016.  Labs: 01/2018 -TC 170, TG 145, HDL 49, LDL 91, potassium 4.5, serum creatinine 0.77, AST 40, ALT 40 (stable), A1c 5.9  ***   Past Medical History:  Diagnosis Date  . Anxiety   . Arthritis   . Baker's cyst    x 2  . CAD (coronary artery disease)    a. s/p MI & PTCA;  b. Ex MV: basal inflat, mid inflat, apical inf, apical lat ischemia;  c. 04/2015 PCI: LM nl, LAD 60p (FFR 0.83), D1 60ost, LCX 50p, 19m (2.75x15 Xience Alpine DES), OM1/2/3 min irregs, RCA 128m CTO.  . Depression    intolerant of paxil, wellbutrin, zoloft  . GERD (gastroesophageal reflux disease)   . Hayfever   . headaches   . Heart murmur   . High cholesterol   . Hypertension   . Insomnia   . Myocardial infarction ([place])   . RLS (restless legs syndrome)   . Sciatica   . Smoker    a. quit 01/2014.    Past Surgical History:  Procedure Laterality Date  . ANGIOPLASTY  1997  . APPENDECTOMY  1978   open  . [place]  . CARDIAC CATHETERIZATION N/A 04/30/2015   Procedure: Left Heart Cath;  Surgeon: Wellington Hampshire, MD;  Location: [place] [facility];  Service: Cardiovascular;  Laterality: N/A;  . CARPAL TUNNEL RELEASE    . CESAREAN SECTION  1980  . CESAREAN SECTION  1984  .  CHOLECYSTECTOMY N/A 03/30/2016   Procedure: LAPAROSCOPIC CHOLECYSTECTOMY WITH INTRAOPERATIVE CHOLANGIOGRAM;  Surgeon: Jules Husbands, MD;  Location: [facility] ORS;  Service: General;  Laterality: N/A;  . [place]  [facility]  . HERNIA REPAIR  11/2016   [facility]; Dr. Gaynelle Arabian  . VAGINAL HYSTERECTOMY      No outpatient medications have been marked as taking for the 11/27/18 encounter (Appointment) with Rise Mu, PA-C.    Allergies:   Adhesive [tape]; Crestor [rosuvastatin calcium]; Effexor [venlafaxine]; Lipitor [atorvastatin]; Paroxetine  hcl; Sertraline hcl; and Wellbutrin [bupropion hcl]   Social History:  The patient  reports that she quit smoking about 3 years ago. Her smoking use included cigarettes. She has a 6.60 pack-year smoking history. She has never used smokeless tobacco. She reports current alcohol use. She reports that she does not use drugs.   Family History:  The patient's family history includes Alcohol abuse in her father; Breast cancer in her maternal grandmother; Diabetes in her mother; Heart attack in her father and mother; Heart disease in her father and mother; Hypertension in her mother.  ROS:   ROS   PHYSICAL EXAM: *** VS:  There were no vitals taken for this visit. BMI: There is no height or weight on file to calculate BMI.  Physical Exam   EKG:  Was ordered and interpreted by me today. Shows ***  Recent Labs: 02/15/2018: ALT 40; BUN 8; Creatinine, Ser 0.77; Potassium 4.5; Sodium 135  02/15/2018: Cholesterol 170; HDL 49.50; LDL Cholesterol 91; Total CHOL/HDL Ratio 3; Triglycerides 145.0; VLDL 29.0   CrCl cannot be calculated (Patient's most recent lab result is older than the maximum 21 days allowed.).   Wt Readings from Last 3 Encounters:  03/16/18 228 lb 4 oz (103.5 kg)  02/15/18 224 lb (101.6 kg)  02/15/18 224 lb (101.6 kg)     Other studies reviewed: Additional studies/records reviewed today include: summarized above  ASSESSMENT AND PLAN:  1. ***  Disposition: F/u with Dr. Fletcher Anon or an APP in ***  Current medicines are reviewed at length with the patient today.  The patient did not have any concerns regarding medicines.  Signed, Christell Faith, PA-C 11/26/2018 7:31 AM     [place] [address] [place] [address] [place], [place] [address] [phone]

## 2018-11-27 ENCOUNTER — Ambulatory Visit: Payer: Medicare HMO | Admitting: Physician Assistant

## 2018-11-28 ENCOUNTER — Other Ambulatory Visit: Payer: Self-pay | Admitting: Family Medicine

## 2018-11-29 NOTE — Telephone Encounter (Signed)
Sent. Thanks.   

## 2018-11-29 NOTE — Telephone Encounter (Signed)
Xanax 1mg  #90 last filled 09/26/18 with 1 refill, last OV 02/2018 acute, upcoming AWV 02/18/2019... please advise

## 2018-12-28 ENCOUNTER — Telehealth: Payer: Self-pay | Admitting: Family Medicine

## 2018-12-28 NOTE — Telephone Encounter (Signed)
Pt's husband dropped off form for jury duty. Placed in [place] tower.

## 2018-12-31 ENCOUNTER — Other Ambulatory Visit: Payer: Self-pay | Admitting: Cardiovascular Disease

## 2018-12-31 ENCOUNTER — Encounter: Payer: Self-pay | Admitting: *Deleted

## 2018-12-31 NOTE — Telephone Encounter (Signed)
Patient requested letter be mailed directly to the courthouse.  Mailed, copy sent for scanning.

## 2018-12-31 NOTE — Telephone Encounter (Signed)
Letter done. Thanks.

## 2019-01-01 NOTE — Telephone Encounter (Signed)
Patient needs an appointment for refills. She was last seen 11/16/2017

## 2019-01-02 ENCOUNTER — Telehealth: Payer: Self-pay | Admitting: Cardiovascular Disease

## 2019-01-02 NOTE — Telephone Encounter (Signed)
LMOV to reschedule follow up

## 2019-01-02 NOTE — Telephone Encounter (Signed)
-----   Message from Janan Ridge, [place] sent at 01/01/2019  8:47 AM EST ----- Regarding: Appointment for refills Patient needs an appointment for further refills. If patient does not want to schedule an appointment please make them aware to contact PCP for refills. I have sent in enough medication until appointment can be made.   Patient was last seen my ryan 11/16/2017 and was told to follow up in 6 months.   Thanks Ladies!

## 2019-01-03 ENCOUNTER — Other Ambulatory Visit: Payer: Self-pay | Admitting: *Deleted

## 2019-01-03 MED ORDER — METOPROLOL TARTRATE 25 MG PO TABS: 25.0000 mg | ORAL_TABLET | Freq: Two times a day (BID) | ORAL | 0 refills | Status: DC

## 2019-01-03 MED ORDER — ISOSORBIDE MONONITRATE ER 60 MG PO TB24: 60.0000 mg | ORAL_TABLET | Freq: Every day | ORAL | 0 refills | Status: DC

## 2019-01-04 ENCOUNTER — Other Ambulatory Visit: Payer: Self-pay | Admitting: *Deleted

## 2019-01-04 MED ORDER — ATORVASTATIN CALCIUM 20 MG PO TABS: 20.0000 mg | ORAL_TABLET | Freq: Every day | ORAL | 3 refills | Status: DC

## 2019-01-04 MED ORDER — PANTOPRAZOLE SODIUM 40 MG PO TBEC: 40.0000 mg | DELAYED_RELEASE_TABLET | Freq: Every day | ORAL | 3 refills | Status: DC

## 2019-01-04 MED ORDER — ISOSORBIDE MONONITRATE ER 60 MG PO TB24: 60.0000 mg | ORAL_TABLET | Freq: Every day | ORAL | 3 refills | Status: DC

## 2019-01-08 ENCOUNTER — Other Ambulatory Visit: Payer: Self-pay | Admitting: *Deleted

## 2019-01-08 MED ORDER — METOPROLOL TARTRATE 25 MG PO TABS: 25.0000 mg | ORAL_TABLET | Freq: Two times a day (BID) | ORAL | 0 refills | Status: DC

## 2019-01-08 NOTE — Telephone Encounter (Signed)
Patient declined to be seen at this time and obtains rx at pcp office.  Patient notified she will be considered a new patient after 3 yrs  And she states she will call back soon to schedule something

## 2019-01-15 ENCOUNTER — Other Ambulatory Visit: Payer: Self-pay | Admitting: *Deleted

## 2019-02-05 ENCOUNTER — Other Ambulatory Visit: Payer: Self-pay | Admitting: *Deleted

## 2019-02-05 MED ORDER — METOPROLOL TARTRATE 25 MG PO TABS: 25.0000 mg | ORAL_TABLET | Freq: Two times a day (BID) | ORAL | 0 refills | Status: DC

## 2019-02-05 NOTE — Telephone Encounter (Signed)
Faxed refill request.  [facility] 90 day supply Alprazolam Last office visit:  03/16/18  CPE scheduled late March Last Filled:    90 tablet 1 11/29/2018   Zolpidem Last office visit:   03/16/18  CPE scheduled late March Last Filled:    90 tablet 1 09/26/2018   Please advise.

## 2019-02-06 MED ORDER — ZOLPIDEM TARTRATE 10 MG PO TABS: 10.0000 mg | ORAL_TABLET | Freq: Every evening | ORAL | 1 refills | Status: DC | PRN

## 2019-02-06 MED ORDER — ALPRAZOLAM 1 MG PO TABS: 1.0000 mg | ORAL_TABLET | Freq: Three times a day (TID) | ORAL | 1 refills | Status: DC | PRN

## 2019-02-06 NOTE — Telephone Encounter (Signed)
Sent. Thanks.   

## 2019-02-18 ENCOUNTER — Encounter: Payer: Medicare HMO | Admitting: Family Medicine

## 2019-02-18 ENCOUNTER — Ambulatory Visit: Payer: Medicare HMO

## 2019-03-04 ENCOUNTER — Other Ambulatory Visit: Payer: Self-pay | Admitting: Family Medicine

## 2019-04-04 ENCOUNTER — Other Ambulatory Visit: Payer: Self-pay | Admitting: Family Medicine

## 2019-05-17 ENCOUNTER — Other Ambulatory Visit: Payer: Self-pay

## 2019-05-17 NOTE — Telephone Encounter (Signed)
Refill request came in from [facility] for Alprazolam. Last filled on 02/06/2019 for 90 tablets with 1 refill. LOV was 03/16/18 but no future appointments made.

## 2019-05-18 MED ORDER — ALPRAZOLAM 1 MG PO TABS: 1.0000 mg | ORAL_TABLET | Freq: Three times a day (TID) | ORAL | 0 refills | Status: DC | PRN

## 2019-05-18 NOTE — Telephone Encounter (Signed)
Sent.  Needs f/u when possible.  If she can't schedule CPE, then reasonable to schedule anxiety f/u via web/phone. Thanks.

## 2019-05-23 ENCOUNTER — Ambulatory Visit: Payer: Medicare HMO | Admitting: Family Medicine

## 2019-05-23 ENCOUNTER — Ambulatory Visit: Payer: Medicare HMO

## 2019-05-27 NOTE — Telephone Encounter (Signed)
Pt doesn't feel comfortable come into office would rather do a follow up on web. Scheduled doxy appt on 06/10/19 @ 4pm.

## 2019-06-10 ENCOUNTER — Ambulatory Visit: Payer: Medicare HMO | Admitting: Family Medicine

## 2019-06-13 ENCOUNTER — Ambulatory Visit (INDEPENDENT_AMBULATORY_CARE_PROVIDER_SITE_OTHER): Payer: Medicare HMO | Admitting: Family Medicine

## 2019-06-13 ENCOUNTER — Other Ambulatory Visit: Payer: Self-pay | Admitting: Family Medicine

## 2019-06-13 DIAGNOSIS — R739 Hyperglycemia, unspecified: Secondary | ICD-10-CM | POA: Diagnosis not present

## 2019-06-13 DIAGNOSIS — F419 Anxiety disorder, unspecified: Secondary | ICD-10-CM

## 2019-06-13 DIAGNOSIS — I2510 Atherosclerotic heart disease of native coronary artery without angina pectoris: Secondary | ICD-10-CM

## 2019-06-13 MED ORDER — ALPRAZOLAM 1 MG PO TABS: 1.0000 mg | ORAL_TABLET | Freq: Three times a day (TID) | ORAL | 2 refills | Status: DC | PRN

## 2019-06-13 NOTE — Progress Notes (Signed)
Interactive audio and video telecommunications were attempted between this provider and patient, however failed, due to patient having technical difficulties OR patient did not have access to video capability.  We continued and completed visit with audio only.   Virtual Visit via Telephone Note  I connected with patient on 06/13/19  at 11:55 AM  by telephone and verified that I am speaking with the correct person using two identifiers.  Location of patient: home.    Location of MD: [facility] Name of referring provider (if blank then none associated): Names per persons and role in encounter:  MD: Earlyne Iba, Patient: name listed above.    I discussed the limitations, risks, security and privacy concerns of performing an evaluation and management service by telephone and the availability of in person appointments. I also discussed with the patient that there may be a patient responsible charge related to this service. The patient expressed understanding and agreed to proceed.  CC: anxiety.    History of Present Illness:   Pandemic consideration d/w pt. This has been a sig source of stress.  She is admittedly worried about going to the grocery store and getting exposed.  Still on xanax at baseline.  With relief.  No ADE on med.  No SI/HI.  She has relatives moving into town, stressors d/w pt.   Insomnia, taking ambien at night with a little help.  No ADE on med.  "I would be awake every 2 hours otherwise."    Rare use of NTG.  I asked her to f/u with cardiology when possible.  D/w pt about setting up lab visit when possible.    Observations/Objective: nad Speech wnl  Assessment and Plan: she'll call about f/u with cardiology, when possible.  We can set up labs when possible.  I will set that up and order labs.    Anxiety.  Cautions d/w pt.  No change in meds at this point.  She agrees.  Still okay for outpatient follow-up.  Follow Up Instructions:  See above.    I discussed  the assessment and treatment plan with the patient. The patient was provided an opportunity to ask questions and all were answered. The patient agreed with the plan and demonstrated an understanding of the instructions.   The patient was advised to call back or seek an in-person evaluation if the symptoms worsen or if the condition fails to improve as anticipated.  I provided 25 minutes of non-face-to-face time during this encounter.  Elsie Stain, MD

## 2019-06-16 NOTE — Assessment & Plan Note (Signed)
Anxiety.  Cautions d/w pt.  No change in meds at this point.  She agrees.  Still okay for outpatient follow-up.

## 2019-06-16 NOTE — Assessment & Plan Note (Signed)
she'll call about f/u with cardiology, when possible.  We can set up labs when possible.  I will set that up and order labs.

## 2019-06-19 ENCOUNTER — Other Ambulatory Visit (INDEPENDENT_AMBULATORY_CARE_PROVIDER_SITE_OTHER): Payer: Medicare HMO

## 2019-06-19 DIAGNOSIS — I2510 Atherosclerotic heart disease of native coronary artery without angina pectoris: Secondary | ICD-10-CM

## 2019-06-19 DIAGNOSIS — R739 Hyperglycemia, unspecified: Secondary | ICD-10-CM

## 2019-06-19 LAB — COMPREHENSIVE METABOLIC PANEL WITH GFR
ALT: 43 U/L — ABNORMAL HIGH (ref 0–35)
AST: 58 U/L — ABNORMAL HIGH (ref 0–37)
Albumin: 4.3 g/dL (ref 3.5–5.2)
Alkaline Phosphatase: 109 U/L (ref 39–117)
BUN: 7 mg/dL (ref 6–23)
CO2: 28 meq/L (ref 19–32)
Calcium: 9.1 mg/dL (ref 8.4–10.5)
Chloride: 101 meq/L (ref 96–112)
Creatinine, Ser: 0.78 mg/dL (ref 0.40–1.20)
GFR: 74.9 mL/min
Glucose, Bld: 111 mg/dL — ABNORMAL HIGH (ref 70–99)
Potassium: 4.3 meq/L (ref 3.5–5.1)
Sodium: 136 meq/L (ref 135–145)
Total Bilirubin: 1.1 mg/dL (ref 0.2–1.2)
Total Protein: 6.8 g/dL (ref 6.0–8.3)

## 2019-06-19 LAB — LIPID PANEL
Cholesterol: 144 mg/dL (ref 0–200)
HDL: 41.1 mg/dL
LDL Cholesterol: 80 mg/dL (ref 0–99)
NonHDL: 102.98
Total CHOL/HDL Ratio: 4
Triglycerides: 113 mg/dL (ref 0.0–149.0)
VLDL: 22.6 mg/dL (ref 0.0–40.0)

## 2019-06-19 LAB — HEMOGLOBIN A1C: Hgb A1c MFr Bld: 5.9 % (ref 4.6–6.5)

## 2019-06-27 ENCOUNTER — Encounter: Payer: Self-pay | Admitting: Family Medicine

## 2019-06-27 DIAGNOSIS — K760 Fatty (change of) liver, not elsewhere classified: Secondary | ICD-10-CM | POA: Insufficient documentation

## 2019-07-26 ENCOUNTER — Telehealth: Payer: Self-pay | Admitting: *Deleted

## 2019-07-26 NOTE — Telephone Encounter (Signed)
Faxed refill request. Zolpidem Last office visit:   06/13/2019 Last Filled:    90 tablet 1 02/06/2019  Please advise.

## 2019-07-29 MED ORDER — ZOLPIDEM TARTRATE 10 MG PO TABS: 10.0000 mg | ORAL_TABLET | Freq: Every evening | ORAL | 1 refills | Status: DC | PRN

## 2019-07-29 NOTE — Telephone Encounter (Signed)
Sent. Thanks.   

## 2019-08-06 NOTE — Telephone Encounter (Signed)
Patient stated that she is needing this script sent into the [facility] .Marland KitchenMarland Kitchen

## 2019-08-07 MED ORDER — ZOLPIDEM TARTRATE 10 MG PO TABS: 10.0000 mg | ORAL_TABLET | Freq: Every evening | ORAL | 1 refills | Status: DC | PRN

## 2019-08-07 NOTE — Addendum Note (Signed)
Addended by: Tonia Ghent on: 08/07/2019 03:58 PM   Modules accepted: Orders

## 2019-08-07 NOTE — Telephone Encounter (Signed)
Sent. Thanks.  Please cancel the rx at [place], [place] [facility] [place]

## 2019-08-13 ENCOUNTER — Ambulatory Visit (INDEPENDENT_AMBULATORY_CARE_PROVIDER_SITE_OTHER): Payer: Medicare HMO

## 2019-08-13 DIAGNOSIS — Z23 Encounter for immunization: Secondary | ICD-10-CM | POA: Diagnosis not present

## 2019-08-20 LAB — FECAL OCCULT BLOOD, GUAIAC: Fecal Occult Blood: NEGATIVE

## 2019-08-22 DIAGNOSIS — I219 Acute myocardial infarction, unspecified: Secondary | ICD-10-CM

## 2019-08-22 HISTORY — DX: Acute myocardial infarction, unspecified: I21.9

## 2019-08-27 ENCOUNTER — Encounter: Payer: Self-pay | Admitting: Cardiology

## 2019-08-27 NOTE — Telephone Encounter (Signed)
This encounter was created in error - please disregard.

## 2019-08-28 ENCOUNTER — Telehealth: Payer: Self-pay | Admitting: Cardiovascular Disease

## 2019-08-28 NOTE — Telephone Encounter (Signed)
Patient returning call for triage.  She was notified of Dr. Jacklynn Ganong recommendations and is already scheduled to see him tomorrow 10/8

## 2019-08-28 NOTE — Telephone Encounter (Signed)
We have not seen her since 2017.  She needs to discuss with her primary care physician and schedule an office visit with [place].

## 2019-08-28 NOTE — Telephone Encounter (Signed)
Pt c/o of Chest Pain: STAT if CP now or developed within 24 hours  1. Are you having CP right now? Not right now, gets a chest pressure when eating   2. Are you experiencing any other symptoms (ex. SOB, nausea, vomiting, sweating)? No  3. How long have you been experiencing CP? 2 days   4. Is your CP continuous or coming and going? Coming and going   5. Have you taken Nitroglycerin? Yes, 2 one day and 2 the next

## 2019-08-28 NOTE — Telephone Encounter (Signed)
Pt called to report that she has been having chest tightness and loud belching after she eats solid foods for the past 4 -5 days.   She says when she eats jello or yogurt she is completely fine but after regular food she gets tightness in her chest and belching... it lasts for a few hours and then passes... she has not had n/v and has had normal stools.. Pt says she tried taking up to 2 nitro twice and it did not help.. no chest discomfort with exertion and no SOB or dizziness.   Pt says she has a hernia as a result of having her gall bladder out years ago and ended up with a hernia mesh... But she feels that in observing her abdomen..one side "pouches out" and there may be a  "hole in the mesh"... I have strongly urged her to call Dr. Gaynelle Arabian the surgeon to let him know.   I have asked her to call her PCP about her GI issues but I will also forward to Dr. Fletcher Anon for review and will call her with any advice or recommendations since pt has h/o CAD on not sure if symptoms could be cardiac related.

## 2019-08-28 NOTE — Telephone Encounter (Signed)
Attempted to call the patient with Dr. Tyrell Antonio recommendations. No answer- I left a message to please call back.

## 2019-08-29 ENCOUNTER — Encounter: Payer: Self-pay | Admitting: Cardiovascular Disease

## 2019-08-29 ENCOUNTER — Other Ambulatory Visit: Payer: Self-pay

## 2019-08-29 ENCOUNTER — Ambulatory Visit (INDEPENDENT_AMBULATORY_CARE_PROVIDER_SITE_OTHER): Payer: Medicare HMO | Admitting: Cardiovascular Disease

## 2019-08-29 VITALS — BP 124/70 | HR 78 | Ht 60.0 in | Wt 213.8 lb

## 2019-08-29 DIAGNOSIS — E785 Hyperlipidemia, unspecified: Secondary | ICD-10-CM

## 2019-08-29 DIAGNOSIS — I25118 Atherosclerotic heart disease of native coronary artery with other forms of angina pectoris: Secondary | ICD-10-CM | POA: Diagnosis not present

## 2019-08-29 DIAGNOSIS — I10 Essential (primary) hypertension: Secondary | ICD-10-CM

## 2019-08-29 DIAGNOSIS — R072 Precordial pain: Secondary | ICD-10-CM

## 2019-08-29 NOTE — Progress Notes (Signed)
Cardiology Office Note   Date:  08/29/2019   ID:  Andrea Webster, DOB 15-Dec-1955, MRN [id number]  PCP:  Tonia Ghent, MD  Cardiologist:   Kathlyn Sacramento, MD   Chief Complaint  Patient presents with  . other    Pt. c/o chest pain & belching a lot. Meds reviewed by the pt. verbally.       History of Present Illness: Andrea Webster is a 63 y.o. female who presents for urgent evaluation regarding chest pain.  She was most recently seen by me in 2017.   She has known history of CAD s/p MI at the age of 16, with PTCA performed to unknown vessel.She has a strong family history for CAD and also smoked until March 2016. Cardiac catheterization in June 2016 showed a chronic total occlusion of the RCA, severe mid LCx disease with a 90% stenosis, and moderate proximal LAD disease.Pressure wire analysis was performed within the LAD stenosis and her fractional flow reserve was found to be not significant at 0.83, thus no PCI was performed within the LAD.The LCx was felt to be the culprit vessel and this was successfully stented using a 2.75 x 15 mm Xience Alpine DES. She has chronic medical conditions including GERD, previous cholecystectomy, hyperlipidemia, essential hypertension and obesity. Over the last 5 days, she experienced increased heartburn with belching and chest pain described as an elephant sitting in her chest.  The symptoms only happen after she eats.  She does not have chest pain with exertion or shortness of breath.  She does report having abdominal hernia related to her previous cholecystectomy.  This also gives her some symptoms.   Past Medical History:  Diagnosis Date  . Anxiety   . Arthritis   . Baker's cyst    x 2  . CAD (coronary artery disease)    a. s/p MI & PTCA;  b. Ex MV: basal inflat, mid inflat, apical inf, apical lat ischemia;  c. 04/2015 PCI: LM nl, LAD 60p (FFR 0.83), D1 60ost, LCX 50p, 10m (2.75x15 Xience Alpine DES), OM1/2/3 min irregs, RCA 136m CTO.   . Depression    intolerant of paxil, wellbutrin, zoloft  . GERD (gastroesophageal reflux disease)   . Hayfever   . headaches   . Heart murmur   . High cholesterol   . Hypertension   . Insomnia   . Myocardial infarction ([place])   . RLS (restless legs syndrome)   . Sciatica   . Smoker    a. quit 01/2014.    Past Surgical History:  Procedure Laterality Date  . ANGIOPLASTY  1997  . APPENDECTOMY  1978   open  . [place]  . CARDIAC CATHETERIZATION N/A 04/30/2015   Procedure: Left Heart Cath;  Surgeon: Wellington Hampshire, MD;  Location: [place] [facility];  Service: Cardiovascular;  Laterality: N/A;  . CARPAL TUNNEL RELEASE    . CESAREAN SECTION  1980  . CESAREAN SECTION  1984  . CHOLECYSTECTOMY N/A 03/30/2016   Procedure: LAPAROSCOPIC CHOLECYSTECTOMY WITH INTRAOPERATIVE CHOLANGIOGRAM;  Surgeon: Jules Husbands, MD;  Location: [facility] ORS;  Service: General;  Laterality: N/A;  [facility] [place]  [facility]  . HERNIA REPAIR  11/2016   [facility]; Dr. Gaynelle Arabian  . VAGINAL HYSTERECTOMY       Current  Outpatient Medications  Medication Sig Dispense Refill  . ALPRAZolam (XANAX) 1 MG tablet Take 1 tablet (1 mg total) by mouth 3 (three) times daily as needed for anxiety. 90 tablet 2  . aspirin 81 MG chewable tablet Chew 81 mg by mouth daily.    Marland Kitchen atorvastatin (LIPITOR) 20 MG tablet Take 1 tablet (20 mg total) by mouth daily. Pt must keep appointment for additional refills, Thank you. 90 tablet 3  . ezetimibe (ZETIA) 10 MG tablet Take 1 tablet (10 mg total) by mouth daily. 90 tablet 0  . fluticasone (FLONASE) 50 MCG/ACT nasal spray USE 2 SPRAYS IN EACH NOSTRIL ONE TIME DAILY 48 g 1  . isosorbide mononitrate (IMDUR) 60 MG 24 hr tablet Take 1 tablet (60 mg total) by mouth daily. 90 tablet 3  . metoprolol tartrate (LOPRESSOR) 25 MG tablet TAKE 1  TABLET TWICE DAILY 180 tablet 1  . Multiple Vitamin (MULTIVITAMIN) tablet Take 1 tablet by mouth daily.      . nitroGLYCERIN (NITROSTAT) 0.4 MG SL tablet PLACE 1 TABLET (0.4 MG TOTAL) UNDER THE TONGUE EVERY 5 (FIVE) MINUTES AS NEEDED FOR CHEST PAIN. 75 tablet 0  . nystatin (MYCOSTATIN/NYSTOP) powder Apply topically 3 (three) times daily. 30 g 0  . pantoprazole (PROTONIX) 40 MG tablet Take 1 tablet (40 mg total) by mouth daily. 90 tablet 3  . senna (SENOKOT) 8.6 MG tablet Take 1 tablet by mouth as needed for constipation.     . VENTOLIN HFA 108 (90 Base) MCG/ACT inhaler INHALE 2 PUFFS EVERY 6  HOURS AS NEEDED FOR WHEEZING OR SHORTNESS OF BREATH. 54 g 1  . zolpidem (AMBIEN) 10 MG tablet Take 1 tablet (10 mg total) by mouth at bedtime as needed. for sleep 90 tablet 1   No current facility-administered medications for this visit.     Allergies:   Paroxetine, Rosuvastatin, Adhesive [tape], Crestor [rosuvastatin calcium], Effexor [venlafaxine], Lipitor [atorvastatin], Paroxetine hcl, Paroxetine hcl, Sertraline hcl, Sertraline hcl, Wellbutrin [bupropion hcl], Bupropion, and Sertraline    Social History:  The patient  reports that she quit smoking about 4 years ago. Her smoking use included cigarettes. She has a 6.60 pack-year smoking history. She has never used smokeless tobacco. She reports current alcohol use. She reports that she does not use drugs.   Family History:  The patient's family history includes Alcohol abuse in her father; Breast cancer in her maternal grandmother; Diabetes in her mother; Heart attack in her father and mother; Heart disease in her father and mother; Hypertension in her mother.    ROS:  Please see the history of present illness.   Otherwise, review of systems are positive for none.   All other systems are reviewed and negative.    PHYSICAL EXAM: VS:  BP 124/70 (BP Location: Left Arm, Patient Position: Sitting, Cuff Size: Large)   Pulse 78   Ht 5' (1.524 m)   Wt 213  lb 12 oz (97 kg)   BMI 41.75 kg/m  , BMI Body mass index is 41.75 kg/m. GEN: Well nourished, well developed, in no acute distress  HEENT: normal  Neck: no JVD, carotid bruits, or masses Cardiac: RRR; no  rubs, or gallops,no edema . 2/6 systolic ejection murmur in the aortic area which is early peaking Respiratory:  clear to auscultation bilaterally, normal work of breathing GI: soft, nontender, nondistended, + BS MS: no deformity or atrophy  Skin: warm and dry, no rash Neuro:  Strength and sensation are intact Psych: euthymic mood, full affect  EKG:  EKG is ordered today. EKG showed sinus rhythm with right bundle branch block and possible old inferior infarct.  Right bundle branch block seems to be new but she has not had an EKG since 2017.   Recent Labs: 06/19/2019: ALT 43; BUN 7; Creatinine, Ser 0.78; Potassium 4.3; Sodium 136    Lipid Panel    Component Value Date/Time   CHOL 144 06/19/2019 1251   CHOL 206 (H) 11/16/2016 1456   TRIG 113.0 06/19/2019 1251   HDL 41.10 06/19/2019 1251   HDL 54 11/16/2016 1456   CHOLHDL 4 06/19/2019 1251   VLDL 22.6 06/19/2019 1251   LDLCALC 80 06/19/2019 1251   LDLCALC 118 (H) 11/16/2016 1456      Wt Readings from Last 3 Encounters:  08/29/19 213 lb 12 oz (97 kg)  03/16/18 228 lb 4 oz (103.5 kg)  02/15/18 224 lb (101.6 kg)         ASSESSMENT AND PLAN:  1.  Coronary artery disease involving native coronary arteries with atypical chest pain: Most of her current symptoms seem to be GI in nature.  Nonetheless, she does complain of chest tightness and given previous cardiac history, this requires urgent evaluation.  Her EKG also shows right bundle branch block which seems to be new.  I recommend urgent evaluation with a Lexiscan Myoview.  If this comes back unremarkable, recommend referral to GI for evaluation.  2. Hyperlipidemia: She did not tolerate rosuvastatin in the past.  High-dose atorvastatin was associated with abnormal liver  function test.  She is currently on 20 mg daily.  Most recent LDL was 80.  We could consider adding Zetia but I want to evaluate her current symptoms before considering adding a medication.  3.  Essential hypertension: Blood pressure is controlled on current medications.  4. Aortic sclerosis murmur: She had an echocardiogram done in 2018 which showed normal LV systolic function with no significant valvular abnormalities.   Disposition:   FU with me in 6 months  Signed,  Kathlyn Sacramento, MD  08/29/2019 1:40 PM    [place]

## 2019-08-29 NOTE — Patient Instructions (Signed)
Medication Instructions:  Your physician recommends that you continue on your current medications as directed. Please refer to the Current Medication list given to you today.  If you need a refill on your cardiac medications before your next appointment, please call your pharmacy.   Lab work: None ordered If you have labs (blood work) drawn today and your tests are completely normal, you will receive your results only by: Marland Kitchen MyChart Message (if you have MyChart) OR . A paper copy in the mail If you have any lab test that is abnormal or we need to change your treatment, we will call you to review the results.  Testing/Procedures: Your physician has requested that you have a lexiscan myoview. For further information please visit [email or web address]. Please follow instruction sheet, as given.    Follow-Up: At [facility], you and your health needs are our priority.  As part of our continuing mission to provide you with exceptional heart care, we have created designated Provider Care Teams.  These Care Teams include your primary Cardiologist (physician) and Advanced Practice Providers (APPs -  Physician Assistants and Nurse Practitioners) who all work together to provide you with the care you need, when you need it. You will need a follow up appointment in 6 months.  Please call our office 2 months in advance to schedule this appointment.  You may see  Dr. Fletcher Anon or one of the following Advanced Practice Providers on your designated Care Team:   Murray Hodgkins, NP Christell Faith, PA-C . Marrianne Mood, PA-C  Any Other Special Instructions Will Be Listed Below (If Applicable).  [place]  Your caregiver has ordered a Stress Test with nuclear imaging. The purpose of this test is to evaluate the blood supply to your heart muscle. This procedure is referred to as a "Non-Invasive Stress Test." This is because other than having an IV started in your vein, nothing is inserted or "invades" your  body. Cardiac stress tests are done to find areas of poor blood flow to the heart by determining the extent of coronary artery disease (CAD). Some patients exercise on a treadmill, which naturally increases the blood flow to your heart, while others who are  unable to walk on a treadmill due to physical limitations have a pharmacologic/chemical stress agent called Lexiscan . This medicine will mimic walking on a treadmill by temporarily increasing your coronary blood flow.   Please note: these test may take anywhere between 2-4 hours to complete  PLEASE REPORT TO [facility] AT THE FIRST DESK WILL DIRECT YOU WHERE TO GO  Date of Procedure:_____________________________________  Arrival Time for Procedure:______________________________  Instructions regarding medication:     _X___:  Hold betablocker(Metoprolol) night before procedure and morning of procedure   PLEASE NOTIFY THE OFFICE AT LEAST 24 HOURS IN ADVANCE IF YOU ARE UNABLE TO KEEP YOUR APPOINTMENT.  [phone] AND  PLEASE NOTIFY NUCLEAR MEDICINE AT [facility] AT LEAST 24 HOURS IN ADVANCE IF YOU ARE UNABLE TO KEEP YOUR APPOINTMENT. [phone]  How to prepare for your Myoview test:  1. Do not eat or drink after midnight 2. No caffeine for 24 hours prior to test 3. No smoking 24 hours prior to test. 4. Your medication may be taken with water.  If your doctor stopped a medication because of this test, do not take that medication. 5. Ladies, please do not wear dresses.  Skirts or pants are appropriate. Please wear a short sleeve shirt. 6. No perfume, cologne or lotion.  7. Wear comfortable walking shoes. No heels!

## 2019-08-29 NOTE — Telephone Encounter (Signed)
Noted  

## 2019-09-02 ENCOUNTER — Encounter: Payer: Self-pay | Admitting: Family Medicine

## 2019-09-02 ENCOUNTER — Ambulatory Visit (INDEPENDENT_AMBULATORY_CARE_PROVIDER_SITE_OTHER)
Admission: RE | Admit: 2019-09-02 | Discharge: 2019-09-02 | Disposition: A | Discharge status: Home / Self Care | Payer: Medicare HMO | Source: Ambulatory Visit | Attending: Family Medicine | Admitting: Family Medicine

## 2019-09-02 ENCOUNTER — Ambulatory Visit (INDEPENDENT_AMBULATORY_CARE_PROVIDER_SITE_OTHER): Payer: Medicare HMO | Admitting: Family Medicine

## 2019-09-02 ENCOUNTER — Other Ambulatory Visit: Payer: Self-pay

## 2019-09-02 VITALS — BP 116/60 | HR 79 | Temp 97.4°F | Ht 59.5 in | Wt 213.6 lb

## 2019-09-02 DIAGNOSIS — R140 Abdominal distension (gaseous): Secondary | ICD-10-CM | POA: Diagnosis not present

## 2019-09-02 DIAGNOSIS — R0789 Other chest pain: Secondary | ICD-10-CM

## 2019-09-02 LAB — COMPREHENSIVE METABOLIC PANEL WITH GFR
ALT: 20 U/L (ref 0–35)
AST: 23 U/L (ref 0–37)
Albumin: 4.5 g/dL (ref 3.5–5.2)
Alkaline Phosphatase: 128 U/L — ABNORMAL HIGH (ref 39–117)
BUN: 8 mg/dL (ref 6–23)
CO2: 29 meq/L (ref 19–32)
Calcium: 10 mg/dL (ref 8.4–10.5)
Chloride: 97 meq/L (ref 96–112)
Creatinine, Ser: 0.83 mg/dL (ref 0.40–1.20)
GFR: 69.67 mL/min
Glucose, Bld: 101 mg/dL — ABNORMAL HIGH (ref 70–99)
Potassium: 4.2 meq/L (ref 3.5–5.1)
Sodium: 136 meq/L (ref 135–145)
Total Bilirubin: 1.2 mg/dL (ref 0.2–1.2)
Total Protein: 7.4 g/dL (ref 6.0–8.3)

## 2019-09-02 LAB — CBC WITH DIFFERENTIAL/PLATELET
Basophils Absolute: 0 10*3/uL (ref 0.0–0.1)
Basophils Relative: 0.4 % (ref 0.0–3.0)
Eosinophils Absolute: 0.1 10*3/uL (ref 0.0–0.7)
Eosinophils Relative: 1.2 % (ref 0.0–5.0)
HCT: 36.7 % (ref 36.0–46.0)
Hemoglobin: 12.3 g/dL (ref 12.0–15.0)
Lymphocytes Relative: 21 % (ref 12.0–46.0)
Lymphs Abs: 2.3 10*3/uL (ref 0.7–4.0)
MCHC: 33.6 g/dL (ref 30.0–36.0)
MCV: 83 fl (ref 78.0–100.0)
Monocytes Absolute: 0.7 10*3/uL (ref 0.1–1.0)
Monocytes Relative: 6.2 % (ref 3.0–12.0)
Neutro Abs: 7.9 10*3/uL — ABNORMAL HIGH (ref 1.4–7.7)
Neutrophils Relative %: 71.2 % (ref 43.0–77.0)
Platelets: 344 10*3/uL (ref 150.0–400.0)
RBC: 4.42 Mil/uL (ref 3.87–5.11)
RDW: 13.9 % (ref 11.5–15.5)
WBC: 11.1 10*3/uL — ABNORMAL HIGH (ref 4.0–10.5)

## 2019-09-02 LAB — LIPASE: Lipase: 13 U/L (ref 11.0–59.0)

## 2019-09-02 MED ORDER — PANTOPRAZOLE SODIUM 40 MG PO TBEC: 40.0000 mg | DELAYED_RELEASE_TABLET | Freq: Two times a day (BID) | ORAL | Status: DC

## 2019-09-02 NOTE — Patient Instructions (Signed)
Go to the lab on the way out.  We'll contact you with your lab and xray report. Increase protonix to twice a day.  If your labs and xray are unremarkable (along with the stress test), and if the higher dose of protonix doesn't help, then we'll likely need to set you up with the GI clinic.  Take care.  Glad to see you.

## 2019-09-02 NOTE — Progress Notes (Signed)
Prev cards eval for chest discomfort.  She has belching and "elephant sitting on chest" with eating solids- noted 30 min after eating but not during eating, can last for hours.  She has pressure in the upper abd with episodes.  She does okay with sips of fluids.  She has stress test pending.  She doesn't have the sensation of food sticking.    GI sx are recent onset.  No vomiting. No hematemesis.  No blood in stools.  No black stools.  No abd pain.  She feels better after belching.  No sx like this prev.  She is already on imdur and NTG didn't help. She is already on protonix.   TUMS and Maalox didn't help.   No dysuria.   Meds, vitals, and allergies reviewed.   ROS: Per HPI unless specifically indicated in ROS section   GEN: nad, alert and oriented HEENT: ncat NECK: supple w/o LA CV: rrr.  PULM: ctab, no inc wob ABD: soft, +bs EXT: no edema SKIN: no acute rash

## 2019-09-04 ENCOUNTER — Emergency Department: Payer: Medicare HMO

## 2019-09-04 ENCOUNTER — Inpatient Hospital Stay
Admission: EM | Admit: 2019-09-04 | Discharge: 2019-09-06 | DRG: 247 | Disposition: A | Discharge status: Home / Self Care | Payer: Medicare HMO | Attending: Internal Medicine | Admitting: Internal Medicine

## 2019-09-04 ENCOUNTER — Other Ambulatory Visit: Payer: Self-pay

## 2019-09-04 DIAGNOSIS — Z9861 Coronary angioplasty status: Secondary | ICD-10-CM | POA: Diagnosis not present

## 2019-09-04 DIAGNOSIS — I214 Non-ST elevation (NSTEMI) myocardial infarction: Principal | ICD-10-CM | POA: Diagnosis present

## 2019-09-04 DIAGNOSIS — I10 Essential (primary) hypertension: Secondary | ICD-10-CM | POA: Diagnosis not present

## 2019-09-04 DIAGNOSIS — Z20828 Contact with and (suspected) exposure to other viral communicable diseases: Secondary | ICD-10-CM | POA: Diagnosis not present

## 2019-09-04 DIAGNOSIS — R079 Chest pain, unspecified: Secondary | ICD-10-CM

## 2019-09-04 DIAGNOSIS — Z7982 Long term (current) use of aspirin: Secondary | ICD-10-CM

## 2019-09-04 DIAGNOSIS — G4700 Insomnia, unspecified: Secondary | ICD-10-CM | POA: Diagnosis present

## 2019-09-04 DIAGNOSIS — Z90710 Acquired absence of both cervix and uterus: Secondary | ICD-10-CM | POA: Diagnosis not present

## 2019-09-04 DIAGNOSIS — G2581 Restless legs syndrome: Secondary | ICD-10-CM | POA: Diagnosis present

## 2019-09-04 DIAGNOSIS — I4510 Unspecified right bundle-branch block: Secondary | ICD-10-CM | POA: Diagnosis present

## 2019-09-04 DIAGNOSIS — I252 Old myocardial infarction: Secondary | ICD-10-CM | POA: Diagnosis not present

## 2019-09-04 DIAGNOSIS — I700 Atherosclerosis of aorta: Secondary | ICD-10-CM | POA: Diagnosis present

## 2019-09-04 DIAGNOSIS — K219 Gastro-esophageal reflux disease without esophagitis: Secondary | ICD-10-CM | POA: Diagnosis present

## 2019-09-04 DIAGNOSIS — F329 Major depressive disorder, single episode, unspecified: Secondary | ICD-10-CM | POA: Diagnosis present

## 2019-09-04 DIAGNOSIS — I361 Nonrheumatic tricuspid (valve) insufficiency: Secondary | ICD-10-CM | POA: Diagnosis not present

## 2019-09-04 DIAGNOSIS — I959 Hypotension, unspecified: Secondary | ICD-10-CM | POA: Diagnosis not present

## 2019-09-04 DIAGNOSIS — I25110 Atherosclerotic heart disease of native coronary artery with unstable angina pectoris: Secondary | ICD-10-CM | POA: Diagnosis not present

## 2019-09-04 DIAGNOSIS — I200 Unstable angina: Secondary | ICD-10-CM | POA: Diagnosis not present

## 2019-09-04 DIAGNOSIS — Z6841 Body Mass Index (BMI) 40.0 and over, adult: Secondary | ICD-10-CM

## 2019-09-04 DIAGNOSIS — R140 Abdominal distension (gaseous): Secondary | ICD-10-CM | POA: Insufficient documentation

## 2019-09-04 DIAGNOSIS — Z9049 Acquired absence of other specified parts of digestive tract: Secondary | ICD-10-CM

## 2019-09-04 DIAGNOSIS — M1990 Unspecified osteoarthritis, unspecified site: Secondary | ICD-10-CM | POA: Diagnosis not present

## 2019-09-04 DIAGNOSIS — Z888 Allergy status to other drugs, medicaments and biological substances status: Secondary | ICD-10-CM | POA: Diagnosis not present

## 2019-09-04 DIAGNOSIS — Z8249 Family history of ischemic heart disease and other diseases of the circulatory system: Secondary | ICD-10-CM

## 2019-09-04 DIAGNOSIS — I2510 Atherosclerotic heart disease of native coronary artery without angina pectoris: Secondary | ICD-10-CM | POA: Diagnosis not present

## 2019-09-04 DIAGNOSIS — F419 Anxiety disorder, unspecified: Secondary | ICD-10-CM | POA: Diagnosis present

## 2019-09-04 DIAGNOSIS — Z955 Presence of coronary angioplasty implant and graft: Secondary | ICD-10-CM

## 2019-09-04 DIAGNOSIS — Z87891 Personal history of nicotine dependence: Secondary | ICD-10-CM

## 2019-09-04 DIAGNOSIS — R0789 Other chest pain: Secondary | ICD-10-CM | POA: Diagnosis not present

## 2019-09-04 DIAGNOSIS — F17200 Nicotine dependence, unspecified, uncomplicated: Secondary | ICD-10-CM | POA: Diagnosis not present

## 2019-09-04 DIAGNOSIS — E876 Hypokalemia: Secondary | ICD-10-CM | POA: Diagnosis not present

## 2019-09-04 DIAGNOSIS — E785 Hyperlipidemia, unspecified: Secondary | ICD-10-CM | POA: Diagnosis present

## 2019-09-04 DIAGNOSIS — Z803 Family history of malignant neoplasm of breast: Secondary | ICD-10-CM

## 2019-09-04 DIAGNOSIS — Z811 Family history of alcohol abuse and dependence: Secondary | ICD-10-CM

## 2019-09-04 DIAGNOSIS — Z79899 Other long term (current) drug therapy: Secondary | ICD-10-CM

## 2019-09-04 DIAGNOSIS — E782 Mixed hyperlipidemia: Secondary | ICD-10-CM | POA: Diagnosis not present

## 2019-09-04 DIAGNOSIS — R7989 Other specified abnormal findings of blood chemistry: Secondary | ICD-10-CM | POA: Diagnosis not present

## 2019-09-04 DIAGNOSIS — R778 Other specified abnormalities of plasma proteins: Secondary | ICD-10-CM

## 2019-09-04 DIAGNOSIS — I25118 Atherosclerotic heart disease of native coronary artery with other forms of angina pectoris: Secondary | ICD-10-CM | POA: Diagnosis not present

## 2019-09-04 DIAGNOSIS — E7800 Pure hypercholesterolemia, unspecified: Secondary | ICD-10-CM | POA: Diagnosis present

## 2019-09-04 DIAGNOSIS — Z833 Family history of diabetes mellitus: Secondary | ICD-10-CM

## 2019-09-04 DIAGNOSIS — Z720 Tobacco use: Secondary | ICD-10-CM | POA: Diagnosis not present

## 2019-09-04 DIAGNOSIS — Z91048 Other nonmedicinal substance allergy status: Secondary | ICD-10-CM

## 2019-09-04 DIAGNOSIS — Z7951 Long term (current) use of inhaled steroids: Secondary | ICD-10-CM

## 2019-09-04 LAB — HEPATIC FUNCTION PANEL
ALT: 22 U/L (ref 0–44)
AST: 31 U/L (ref 15–41)
Albumin: 4.2 g/dL (ref 3.5–5.0)
Alkaline Phosphatase: 116 U/L (ref 38–126)
Bilirubin, Direct: 0.2 mg/dL (ref 0.0–0.2)
Indirect Bilirubin: 1.3 mg/dL — ABNORMAL HIGH (ref 0.3–0.9)
Total Bilirubin: 1.5 mg/dL — ABNORMAL HIGH (ref 0.3–1.2)
Total Protein: 7.7 g/dL (ref 6.5–8.1)

## 2019-09-04 LAB — BASIC METABOLIC PANEL
Anion gap: 12 (ref 5–15)
BUN: 8 mg/dL (ref 8–23)
CO2: 23 mmol/L (ref 22–32)
Calcium: 9.4 mg/dL (ref 8.9–10.3)
Chloride: 99 mmol/L (ref 98–111)
Creatinine, Ser: 0.77 mg/dL (ref 0.44–1.00)
GFR calc Af Amer: >60 mL/min (ref >60)
GFR calc non Af Amer: >60 mL/min (ref >60)
Glucose, Bld: 140 mg/dL — ABNORMAL HIGH (ref 70–99)
Potassium: 3.5 mmol/L (ref 3.5–5.1)
Sodium: 134 mmol/L — ABNORMAL LOW (ref 135–145)

## 2019-09-04 LAB — CBC
HCT: 36.1 % (ref 36.0–46.0)
Hemoglobin: 12 g/dL (ref 12.0–15.0)
MCH: 27.3 pg (ref 26.0–34.0)
MCHC: 33.2 g/dL (ref 30.0–36.0)
MCV: 82.2 fL (ref 80.0–100.0)
Platelets: 314 10*3/uL (ref 150–400)
RBC: 4.39 MIL/uL (ref 3.87–5.11)
RDW: 13 % (ref 11.5–15.5)
WBC: 12.1 10*3/uL — ABNORMAL HIGH (ref 4.0–10.5)
nRBC: 0 % (ref 0.0–0.2)

## 2019-09-04 LAB — PROTIME-INR
INR: 1.1 (ref 0.8–1.2)
Prothrombin Time: 13.9 seconds (ref 11.4–15.2)

## 2019-09-04 LAB — LIPASE, BLOOD: Lipase: 21 U/L (ref 11–51)

## 2019-09-04 LAB — APTT: aPTT: 34 seconds (ref 24–36)

## 2019-09-04 LAB — TROPONIN I (HIGH SENSITIVITY)
Troponin I (High Sensitivity): 978 ng/L (ref <18)
Troponin I (High Sensitivity): 2720 ng/L (ref <18)

## 2019-09-04 MED ORDER — ONDANSETRON HCL 4 MG/2ML IJ SOLN
4.0000 mg | Freq: Once | INTRAMUSCULAR | Status: AC
  Administered 2019-09-04: 4 mg via INTRAVENOUS
  Filled 2019-09-04: qty 2

## 2019-09-04 MED ORDER — SODIUM CHLORIDE 0.9 % IV SOLN
INTRAVENOUS | Status: DC
  Administered 2019-09-04: 23:00:00 via INTRAVENOUS

## 2019-09-04 MED ORDER — SENNA 8.6 MG PO TABS: 1.0000 | ORAL_TABLET | ORAL | Status: DC | PRN

## 2019-09-04 MED ORDER — HEPARIN (PORCINE) 25000 UT/250ML-% IV SOLN
1250.0000 [IU]/h | INTRAVENOUS | Status: DC
  Administered 2019-09-04: 800 [IU]/h via INTRAVENOUS
  Administered 2019-09-05: 1250 [IU]/h via INTRAVENOUS
  Filled 2019-09-04: qty 250

## 2019-09-04 MED ORDER — ONDANSETRON HCL 4 MG/2ML IJ SOLN: 4.0000 mg | Freq: Four times a day (QID) | INTRAMUSCULAR | Status: DC | PRN

## 2019-09-04 MED ORDER — NITROGLYCERIN 0.4 MG SL SUBL: 0.4000 mg | SUBLINGUAL_TABLET | SUBLINGUAL | Status: DC | PRN

## 2019-09-04 MED ORDER — ASPIRIN EC 81 MG PO TBEC
81.0000 mg | DELAYED_RELEASE_TABLET | Freq: Every day | ORAL | Status: DC
  Administered 2019-09-05: 81 mg via ORAL
  Filled 2019-09-04: qty 1

## 2019-09-04 MED ORDER — NITROGLYCERIN 0.4 MG SL SUBL
SUBLINGUAL_TABLET | SUBLINGUAL | Status: AC
  Administered 2019-09-04: 0.4 mg
  Filled 2019-09-04: qty 1

## 2019-09-04 MED ORDER — METOPROLOL TARTRATE 5 MG/5ML IV SOLN: 2.5000 mg | INTRAVENOUS | Status: DC | PRN

## 2019-09-04 MED ORDER — ZOLPIDEM TARTRATE 5 MG PO TABS
5.0000 mg | ORAL_TABLET | Freq: Every evening | ORAL | Status: DC | PRN
  Administered 2019-09-04 – 2019-09-05 (×2): 5 mg via ORAL
  Filled 2019-09-04 (×2): qty 1

## 2019-09-04 MED ORDER — ALUM & MAG HYDROXIDE-SIMETH 200-200-20 MG/5ML PO SUSP
30.0000 mL | Freq: Once | ORAL | Status: AC
  Administered 2019-09-04: 30 mL via ORAL
  Filled 2019-09-04: qty 30

## 2019-09-04 MED ORDER — HEPARIN BOLUS VIA INFUSION
4000.0000 [IU] | Freq: Once | INTRAVENOUS | Status: AC
  Administered 2019-09-04: 4000 [IU] via INTRAVENOUS
  Filled 2019-09-04: qty 4000

## 2019-09-04 MED ORDER — ISOSORBIDE MONONITRATE ER 60 MG PO TB24
60.0000 mg | ORAL_TABLET | Freq: Every day | ORAL | Status: DC
  Administered 2019-09-06: 60 mg via ORAL
  Filled 2019-09-04: qty 1

## 2019-09-04 MED ORDER — NITROGLYCERIN IN D5W 200-5 MCG/ML-% IV SOLN
0.0000 ug/min | INTRAVENOUS | Status: DC
  Administered 2019-09-04: 30 ug/min via INTRAVENOUS
  Administered 2019-09-04: 5 ug/min via INTRAVENOUS
  Filled 2019-09-04: qty 250

## 2019-09-04 MED ORDER — EZETIMIBE 10 MG PO TABS
10.0000 mg | ORAL_TABLET | Freq: Every day | ORAL | Status: DC
  Administered 2019-09-06: 10 mg via ORAL
  Filled 2019-09-04: qty 1

## 2019-09-04 MED ORDER — SODIUM CHLORIDE 0.9% FLUSH: 3.0000 mL | Freq: Once | INTRAVENOUS | Status: DC

## 2019-09-04 MED ORDER — MORPHINE SULFATE (PF) 4 MG/ML IV SOLN
4.0000 mg | INTRAVENOUS | Status: DC | PRN
  Administered 2019-09-04 – 2019-09-05 (×4): 4 mg via INTRAVENOUS
  Filled 2019-09-04 (×4): qty 1

## 2019-09-04 MED ORDER — PANTOPRAZOLE SODIUM 40 MG PO TBEC
40.0000 mg | DELAYED_RELEASE_TABLET | Freq: Two times a day (BID) | ORAL | Status: DC
  Administered 2019-09-04 – 2019-09-06 (×3): 40 mg via ORAL
  Filled 2019-09-04 (×3): qty 1

## 2019-09-04 MED ORDER — OXYCODONE-ACETAMINOPHEN 5-325 MG PO TABS
1.0000 | ORAL_TABLET | Freq: Four times a day (QID) | ORAL | Status: DC | PRN
  Administered 2019-09-04: 1 via ORAL
  Filled 2019-09-04: qty 1

## 2019-09-04 MED ORDER — FLUTICASONE PROPIONATE 50 MCG/ACT NA SUSP
1.0000 | Freq: Every day | NASAL | Status: DC
  Filled 2019-09-04: qty 16

## 2019-09-04 MED ORDER — SODIUM CHLORIDE 0.9 % IV SOLN
Freq: Once | INTRAVENOUS | Status: AC
  Administered 2019-09-04: 21:00:00 via INTRAVENOUS

## 2019-09-04 MED ORDER — ATORVASTATIN CALCIUM 20 MG PO TABS
40.0000 mg | ORAL_TABLET | Freq: Every day | ORAL | Status: DC
  Administered 2019-09-05: 40 mg via ORAL
  Filled 2019-09-04: qty 2

## 2019-09-04 MED ORDER — LIDOCAINE VISCOUS HCL 2 % MT SOLN
15.0000 mL | Freq: Once | OROMUCOSAL | Status: AC
  Administered 2019-09-04: 15 mL via ORAL
  Filled 2019-09-04: qty 15

## 2019-09-04 MED ORDER — ACETAMINOPHEN 325 MG PO TABS: 650.0000 mg | ORAL_TABLET | ORAL | Status: DC | PRN

## 2019-09-04 MED ORDER — MORPHINE SULFATE (PF) 4 MG/ML IV SOLN: 4.0000 mg | INTRAVENOUS | Status: DC | PRN

## 2019-09-04 MED ORDER — METOPROLOL TARTRATE 25 MG PO TABS
25.0000 mg | ORAL_TABLET | Freq: Two times a day (BID) | ORAL | Status: DC
  Administered 2019-09-04 – 2019-09-06 (×3): 25 mg via ORAL
  Filled 2019-09-04 (×3): qty 1

## 2019-09-04 MED ORDER — ASPIRIN 81 MG PO CHEW
324.0000 mg | CHEWABLE_TABLET | Freq: Once | ORAL | Status: AC
  Administered 2019-09-04: 324 mg via ORAL
  Filled 2019-09-04: qty 4

## 2019-09-04 MED ORDER — ALPRAZOLAM 0.25 MG PO TABS
0.2500 mg | ORAL_TABLET | Freq: Two times a day (BID) | ORAL | Status: DC | PRN
  Administered 2019-09-04 – 2019-09-05 (×2): 0.25 mg via ORAL
  Filled 2019-09-04 (×2): qty 1

## 2019-09-04 NOTE — ED Provider Notes (Signed)
[facility] Emergency Department Provider Note    First MD Initiated Contact with Patient 09/04/19 1823     (approximate)  I have reviewed the triage vital signs and the nursing notes.   HISTORY  Chief Complaint Chest Pain    HPI Andrea Webster is a 63 y.o. female presents the ER for evaluation of epigastric discomfort comfort frequent belching feel like her chest and insides are burning for the past several days.  States it got worse today after eating a [place] sandwich.  States is worse after eating solid foods.  D does not have the same symptoms after eating Jell-O or liquid.  Does not feel like food is getting stuck.  Does have a history of CAD and scheduled for outpatient Lexiscan.  Outpatient clinics having concern for GI related symptoms and is scheduled outpatient GI follow-up with patient symptoms have worsened and she felt like she could not wait any longer.   Past Medical History:  Diagnosis Date  . Anxiety   . Arthritis   . Baker's cyst    x 2  . CAD (coronary artery disease)    a. s/p MI & PTCA;  b. Ex MV: basal inflat, mid inflat, apical inf, apical lat ischemia;  c. 04/2015 PCI: LM nl, LAD 60p (FFR 0.83), D1 60ost, LCX 50p, 38m (2.75x15 Xience Alpine DES), OM1/2/3 min irregs, RCA 165m CTO.  . Depression    intolerant of paxil, wellbutrin, zoloft  . GERD (gastroesophageal reflux disease)   . Hayfever   . headaches   . Heart murmur   . High cholesterol   . Hypertension   . Insomnia   . Myocardial infarction ([place])   . RLS (restless legs syndrome)   . Sciatica   . Smoker    a. quit 01/2014.   Family History  Problem Relation Age of Onset  . Heart disease Mother   . Diabetes Mother   . Hypertension Mother   . Heart attack Mother   . Alcohol abuse Father   . Heart disease Father   . Heart attack Father   . Breast cancer Maternal Grandmother   . Colon cancer Neg Hx   . Bladder Cancer Neg Hx   . Kidney cancer Neg Hx    Past  Surgical History:  Procedure Laterality Date  . ANGIOPLASTY  1997  . APPENDECTOMY  1978   open  . [place]  . CARDIAC CATHETERIZATION N/A 04/30/2015   Procedure: Left Heart Cath;  Surgeon: Wellington Hampshire, MD;  Location: [place] [facility];  Service: Cardiovascular;  Laterality: N/A;  . CARPAL TUNNEL RELEASE    . CESAREAN SECTION  1980  . CESAREAN SECTION  1984  . CHOLECYSTECTOMY N/A 03/30/2016   Procedure: LAPAROSCOPIC CHOLECYSTECTOMY WITH INTRAOPERATIVE CHOLANGIOGRAM;  Surgeon: Jules Husbands, MD;  Location: [facility] ORS;  Service: General;  Laterality: N/A;  . [place]  [facility]  . HERNIA REPAIR  11/2016   [facility]; Dr. Gaynelle Arabian  . VAGINAL HYSTERECTOMY     Patient Active Problem List   Diagnosis Date Noted  . NSTEMI (non-ST elevated myocardial infarction) ([place]) 09/04/2019  . Fatty liver 06/27/2019  . Boil, axilla 03/18/2018  . Rash 02/16/2018  . Hyperglycemia 02/02/2017  . Stress incontinence 02/02/2017  . Healthcare maintenance 02/02/2017  .  Allergic rhinitis 02/02/2017  . Hernia of abdominal wall 09/28/2016  . Colon cancer screening 04/18/2016  . Breast cancer screening 04/18/2016  . LFT elevation 04/18/2016  . S/P cholecystectomy 04/18/2016  . Dysuria 12/15/2015  . Essential hypertension 12/15/2015  . Traumatic ecchymosis of left thigh 05/18/2015  . Hoarseness 05/18/2015  . GERD (gastroesophageal reflux disease) 05/18/2015  . Unstable angina ([place]) 05/01/2015  . Effort angina ([place]) 04/30/2015  . Angina pectoris ([place]) 04/15/2015  . Advance care planning 07/01/2014  . Benign paroxysmal positional vertigo 07/01/2014  . Undiagnosed cardiac murmurs 04/11/2013  . Medicare annual wellness visit, initial 04/11/2013  . Mollusca contagiosa 05/08/2012  . Knee pain 11/02/2011  . Warts 06/29/2011  . HLD  (hyperlipidemia) 04/28/2011  . Insomnia 04/28/2011  . Bradycardia 04/28/2011  . CAD (coronary artery disease), native coronary artery 03/29/2011  . Sciatica 03/29/2011  . Anxiety 03/29/2011  . Former smoker 03/29/2011      Prior to Admission medications   Medication Sig Start Date End Date Taking? Authorizing Provider  ALPRAZolam Duanne Moron) 1 MG tablet Take 1 tablet (1 mg total) by mouth 3 (three) times daily as needed for anxiety. 06/13/19  Yes Tonia Ghent, MD  aspirin 81 MG chewable tablet Chew 81 mg by mouth daily.   Yes [provider]  atorvastatin (LIPITOR) 20 MG tablet Take 1 tablet (20 mg total) by mouth daily. Pt must keep appointment for additional refills, Thank you. 01/04/19  Yes Tonia Ghent, MD  ezetimibe (ZETIA) 10 MG tablet Take 1 tablet (10 mg total) by mouth daily. 01/15/18 09/04/19 Yes Wellington Hampshire, MD  fluticasone (FLONASE) 50 MCG/ACT nasal spray USE 2 SPRAYS IN EACH NOSTRIL ONE TIME DAILY 03/05/19  Yes Tower, Wynelle Fanny, MD  isosorbide mononitrate (IMDUR) 60 MG 24 hr tablet Take 1 tablet (60 mg total) by mouth daily. 01/04/19  Yes Tonia Ghent, MD  metoprolol tartrate (LOPRESSOR) 25 MG tablet TAKE 1 TABLET TWICE DAILY 06/14/19  Yes Tonia Ghent, MD  Multiple Vitamin (MULTIVITAMIN) tablet Take 1 tablet by mouth daily.     Yes [provider]  pantoprazole (PROTONIX) 40 MG tablet Take 1 tablet (40 mg total) by mouth 2 (two) times daily. 09/02/19  Yes Tonia Ghent, MD  senna (SENOKOT) 8.6 MG tablet Take 1 tablet by mouth as needed for constipation.    Yes [provider]  zolpidem (AMBIEN) 10 MG tablet Take 1 tablet (10 mg total) by mouth at bedtime as needed. for sleep 08/07/19  Yes Tonia Ghent, MD  nitroGLYCERIN (NITROSTAT) 0.4 MG SL tablet PLACE 1 TABLET (0.4 MG TOTAL) UNDER THE TONGUE EVERY 5 (FIVE) MINUTES AS NEEDED FOR CHEST PAIN. 04/30/18   Wellington Hampshire, MD  nystatin (MYCOSTATIN/NYSTOP) powder Apply topically 3 (three)  times daily. 02/15/18   Tonia Ghent, MD  VENTOLIN HFA 108 [phone] Base) MCG/ACT inhaler INHALE 2 PUFFS EVERY 6  HOURS AS NEEDED FOR WHEEZING OR SHORTNESS OF BREATH. 02/12/16   Tonia Ghent, MD    Allergies Adhesive [tape], Crestor [rosuvastatin calcium], Effexor [venlafaxine], Lipitor [atorvastatin], Paroxetine hcl, Sertraline hcl, and Wellbutrin [bupropion hcl]    Social History Social History   Tobacco Use  . Smoking status: Former Smoker    Packs/day: 0.33    Years: 20.00    Pack years: 6.60    Types: Cigarettes    Quit date: 02/13/2015    Years since quitting: 4.5  . Smokeless tobacco: Never Used  Substance Use Topics  .  Alcohol use: Yes    Alcohol/week: 0.0 standard drinks    Comment: rare  . Drug use: No    Review of Systems Patient denies headaches, rhinorrhea, blurry vision, numbness, shortness of breath, chest pain, edema, cough, abdominal pain, nausea, vomiting, diarrhea, dysuria, fevers, rashes or hallucinations unless otherwise stated above in HPI. ____________________________________________   PHYSICAL EXAM:  VITAL SIGNS: Vitals:   09/04/19 2130 09/04/19 2133  BP: (!) 156/86   Pulse:  88  Resp: 14 (!) 22  SpO2:  98%    Constitutional: Alert and oriented.  Eyes: Conjunctivae are normal.  Head: Atraumatic. Nose: No congestion/rhinnorhea. Mouth/Throat: Mucous membranes are moist.   Neck: No stridor. Painless ROM.  Cardiovascular: Normal rate, regular rhythm. Grossly normal heart sounds.  Good peripheral circulation. Respiratory: Normal respiratory effort.  No retractions. Lungs CTAB. Gastrointestinal: Soft and nontender. No distention. No abdominal bruits. No CVA tenderness. Genitourinary:  Musculoskeletal: No lower extremity tenderness nor edema.  No joint effusions. Neurologic:  Normal speech and language. No gross focal neurologic deficits are appreciated. No facial droop Skin:  Skin is warm, dry and intact. No rash noted. Psychiatric: Mood and  affect are normal. Speech and behavior are normal.  ____________________________________________   LABS (all labs ordered are listed, but only abnormal results are displayed)  Results for orders placed or performed during the hospital encounter of 09/04/19 (from the past 24 hour(s))  Basic metabolic panel     Status: Abnormal   Collection Time: 09/04/19  6:25 PM  Result Value Ref Range   Sodium 134 (L) 135 - 145 mmol/L   Potassium 3.5 3.5 - 5.1 mmol/L   Chloride 99 98 - 111 mmol/L   CO2 23 22 - 32 mmol/L   Glucose, Bld 140 (H) 70 - 99 mg/dL   BUN 8 8 - 23 mg/dL   Creatinine, Ser 0.77 0.44 - 1.00 mg/dL   Calcium 9.4 8.9 - 10.3 mg/dL   GFR calc non Af Amer >60 >60 mL/min   GFR calc Af Amer >60 >60 mL/min   Anion gap 12 5 - 15  CBC     Status: Abnormal   Collection Time: 09/04/19  6:25 PM  Result Value Ref Range   WBC 12.1 (H) 4.0 - 10.5 K/uL   RBC 4.39 3.87 - 5.11 MIL/uL   Hemoglobin 12.0 12.0 - 15.0 g/dL   HCT 36.1 36.0 - 46.0 %   MCV 82.2 80.0 - 100.0 fL   MCH 27.3 26.0 - 34.0 pg   MCHC 33.2 30.0 - 36.0 g/dL   RDW 13.0 11.5 - 15.5 %   Platelets 314 150 - 400 K/uL   nRBC 0.0 0.0 - 0.2 %  Troponin I (High Sensitivity)     Status: Abnormal   Collection Time: 09/04/19  6:25 PM  Result Value Ref Range   Troponin I (High Sensitivity) 978 (HH) <18 ng/L  Hepatic function panel     Status: Abnormal   Collection Time: 09/04/19  6:25 PM  Result Value Ref Range   Total Protein 7.7 6.5 - 8.1 g/dL   Albumin 4.2 3.5 - 5.0 g/dL   AST 31 15 - 41 U/L   ALT 22 0 - 44 U/L   Alkaline Phosphatase 116 38 - 126 U/L   Total Bilirubin 1.5 (H) 0.3 - 1.2 mg/dL   Bilirubin, Direct 0.2 0.0 - 0.2 mg/dL   Indirect Bilirubin 1.3 (H) 0.3 - 0.9 mg/dL  Lipase, blood     Status: None   Collection  Time: 09/04/19  6:25 PM  Result Value Ref Range   Lipase 21 11 - 51 U/L  APTT     Status: None   Collection Time: 09/04/19  6:25 PM  Result Value Ref Range   aPTT 34 24 - 36 seconds  Protime-INR      Status: None   Collection Time: 09/04/19  6:25 PM  Result Value Ref Range   Prothrombin Time 13.9 11.4 - 15.2 seconds   INR 1.1 0.8 - 1.2  Troponin I (High Sensitivity)     Status: Abnormal   Collection Time: 09/04/19  8:30 PM  Result Value Ref Range   Troponin I (High Sensitivity) 2,720 (HH) <18 ng/L   ____________________________________________  EKG My review and personal interpretation at Time: 18:11   Indication: chest pain  Rate: 105  Rhythm: sinus Axis: normal Other: nonspecific st abn with subtle depression in I and aVL now more pronounced than previous, no stemi ____________________________________________  RADIOLOGY  I personally reviewed all radiographic images ordered to evaluate for the above acute complaints and reviewed radiology reports and findings.  These findings were personally discussed with the patient.  Please see medical record for radiology report.  ____________________________________________   PROCEDURES  Procedure(s) performed:  .Critical Care Performed by: Merlyn Lot, MD Authorized by: Merlyn Lot, MD   Critical care provider statement:    Critical care time (minutes):  30   Critical care time was exclusive of:  Separately billable procedures and treating other patients   Critical care was necessary to treat or prevent imminent or life-threatening deterioration of the following conditions:  Cardiac failure   Critical care was time spent personally by me on the following activities:  Development of treatment plan with patient or surrogate, discussions with consultants, evaluation of patient's response to treatment, examination of patient, obtaining history from patient or surrogate, ordering and performing treatments and interventions, ordering and review of laboratory studies, ordering and review of radiographic studies, pulse oximetry, re-evaluation of patient's condition and review of old charts      Critical Care performed: yes  ____________________________________________   INITIAL IMPRESSION / [place] / ED COURSE  Pertinent labs & imaging results that were available during my care of the patient were reviewed by me and considered in my medical decision making (see chart for details).   DDX: acs, pericarditis, mass, gastritis, enteritis, sbo, pancreatitis  MUSIQ BARAL is a 63 y.o. who presents to the ED with symptoms as described above.  Initially difficult to differentiate between abdominal and chest discomfort.  Her abdominal exam is benign but she is complaining of frequent belching.  May be atypical angina.  Her EKG does show some subtle ST changes.  No STEMI criteria.  She appears well but is complaining of pain.  Will give pain medication.  The patient will be placed on continuous pulse oximetry and telemetry for monitoring.  Laboratory evaluation will be sent to evaluate for the above complaints.  Clinical Course as of Sep 04 2147  Wed Sep 04, 2019  1947 Troponin critically elevated is noted.  Will consult cardiology.   [PR]  1951 Discussed case with Dr. Curt Bears of cardiology agrees with plan for heparinization aspirin admission the hospital for further management.  We will try to get the patient's pain under control.   [PR]  2127 Troponin is rising.  Patient's blood pressure increasing.  Will start on nitro drip.  Repeat EKG shows no worsening EKG changes.  Still non-STEMI.  Admitted to  hospital    [PR]  2146 Patient reassessed.  Feels symptoms are improving.  Her EKG appears improved as compared to previous.  We will continue to monitor.   [PR]    Clinical Course User Index [PR] Merlyn Lot, MD    The patient was evaluated in Emergency Department today for the symptoms described in the history of present illness. He/she was evaluated in the context of the global COVID-19 pandemic, which necessitated consideration that the patient might be at risk for infection with the SARS-CoV-2  virus that causes COVID-19. Institutional protocols and algorithms that pertain to the evaluation of patients at risk for COVID-19 are in a state of rapid change based on information released by regulatory bodies including the CDC and federal and state organizations. These policies and algorithms were followed during the patient's care in the ED.  As part of my medical decision making, I reviewed the following data within the [place] notes reviewed and incorporated, Labs reviewed, notes from prior ED visits and [place] Controlled Substance Database   ____________________________________________   FINAL CLINICAL IMPRESSION(S) / ED DIAGNOSES  Final diagnoses:  Chest pain, unspecified type  Elevated troponin I level      NEW MEDICATIONS STARTED DURING THIS VISIT:  New Prescriptions   No medications on file     Note:  This document was prepared using Dragon voice recognition software and may include unintentional dictation errors.    Merlyn Lot, MD 09/04/19 2148

## 2019-09-04 NOTE — Progress Notes (Signed)
[place] for heparin Indication: chest pain/ACS  Allergies  Allergen Reactions  . Adhesive [Tape] Other (See Comments)    Don't use flex/fabric bandaids  . Crestor [Rosuvastatin Calcium] Other (See Comments)    aches  . Effexor [Venlafaxine] Other (See Comments)    irritable  . Lipitor [Atorvastatin] Other (See Comments)    Intolerant  . Paroxetine Hcl   . Sertraline Hcl   . Wellbutrin [Bupropion Hcl]     Patient Measurements: Height: 5' (152.4 cm) Weight: 213 lb (96.6 kg) IBW/kg (Calculated) : 45.5 Heparin Dosing Weight: 68 kg  Vital Signs: BP: 173/92 (10/14 1836) Pulse Rate: 93 (10/14 1915)  Labs: Recent Labs    09/02/19 1321 09/04/19 1825  HGB 12.3 12.0  HCT 36.7 36.1  PLT 344.0 314  CREATININE 0.83 0.77  TROPONINIHS  --  978*    Estimated Creatinine Clearance: 76.8 mL/min (by C-G formula based on SCr of 0.77 mg/dL).   Assessment: 63 year old female admitted with chest pain worse after eating solid food and accompanied by belching. Patient recently saw cardiologist who recommends GI workup as well. Patient originally scheduled for Lexiscan Myoview next week but states she cannot wait that long. Pharmacy consulted for heparin drip.  Goal of Therapy:  Heparin level 0.3-0.7 units/ml Monitor platelets by anticoagulation protocol: Yes   Plan:  Heparin 4000 unit bolus followed by heparin drip at 800 units/hr. Heparin level tomorrow at 0300. CBC with morning labs.  Tawnya Crook, PharmD 09/04/2019,8:01 PM

## 2019-09-04 NOTE — ED Notes (Signed)
Lab called to notify of add-ons. Lab states they'll add them now.

## 2019-09-04 NOTE — ED Notes (Signed)
Lab called to add on PTT/INR. Levada Dy states it will be added now.

## 2019-09-04 NOTE — ED Notes (Signed)
EDP Quentin Cornwall notified of inc trop.

## 2019-09-04 NOTE — Progress Notes (Signed)
Advanced Care Plan.  Purpose of Encounter: CODE STATUS. Parties in Attendance: The patient, her husband and me. Patient's Decisional Capacity: Yes. Medical Story: Andrea Webster  is a 63 y.o. female with a known history of CAD, s/p PCI, hypertension, hyperlipidemia etc.  The patient is being admitted for non-STEMI.  I discussed with patient about her current condition, prognosis and CODE STATUS.  Patient does want to be resuscitated and intubated if she has cardiopulmonary arrest. Plan:  Code Status: Full code. Time spent discussing advance care planning: 17 minutes.

## 2019-09-04 NOTE — ED Notes (Signed)
EDP Robinson at bedside.  

## 2019-09-04 NOTE — ED Notes (Signed)
Pt up to bedside toilet.  

## 2019-09-04 NOTE — ED Notes (Addendum)
Pt c/o pain over last few weeks to months that is intermittent. States it is from the waste up into chest and feels like burning. States happens after eating solid foods. States has upcoming appointment with doc to address this but couldn't stand to wait any longer. Pt resting calmly in bed; in NAD. Blue/grn/lav tubes sent to lab.

## 2019-09-04 NOTE — ED Triage Notes (Signed)
Pt c/o generalized chest pain worse after eating solid food, states she has a lot of belching with it. States she just saw Dr. Sophronia Simas today and he seems to think it is GI, states she has an appt for lexi scan next week but cant wait that long.

## 2019-09-04 NOTE — ED Notes (Signed)
EDP Quentin Cornwall and admitting MD Bridgett Larsson notified pt's trop inc to [phone].

## 2019-09-04 NOTE — Assessment & Plan Note (Signed)
If her labs and xray are unremarkable (along with the stress test), and if the higher dose of protonix doesn't help, then we'll likely need to set her up with the GI clinic.  At this point still okay for outpatient follow-up.  She agrees.  See notes on labs.

## 2019-09-04 NOTE — ED Notes (Signed)
Pt given vanilla ice cream and diet soda as requested.

## 2019-09-04 NOTE — ED Notes (Signed)
Nitro running through same IV line as micromedex confirms compatibility with heparin. Will attempt for 2nd IV as well.

## 2019-09-04 NOTE — ED Notes (Signed)
Called radiology to notify pt ready.

## 2019-09-04 NOTE — ED Notes (Signed)
Pt continues to state CP unchanged.

## 2019-09-04 NOTE — ED Notes (Signed)
Pt given cup of iced water ?

## 2019-09-04 NOTE — H&P (Signed)
[facility] [place] NAME: Andrea Webster    MR#:  [id number]  DATE OF BIRTH:  13-May-1956  DATE OF ADMISSION:  09/04/2019  PRIMARY CARE PHYSICIAN: Tonia Ghent, MD   REQUESTING/REFERRING PHYSICIAN: Dr. Quentin Cornwall.  CHIEF COMPLAINT:   Chief Complaint  Patient presents with  . Chest Pain   Chest discomfort and belching. HISTORY OF PRESENT ILLNESS:  Andrea Webster  is a 63 y.o. female with a known history of multiple medical problems as below.  The patient presents the ED with above chief complaints.  She denies any obvious chest pain, abdominal pain, nausea or diaphoresis.  She has a history of CAD and is scheduled for outpatient stress test.  Since her symptoms is getting worse, she came to ED for further evaluation.  She is found elevated troponin at 978.  Dr. Quentin Cornwall ordered heparin drip and request admission. PAST MEDICAL HISTORY:   Past Medical History:  Diagnosis Date  . Anxiety   . Arthritis   . Baker's cyst    x 2  . CAD (coronary artery disease)    a. s/p MI & PTCA;  b. Ex MV: basal inflat, mid inflat, apical inf, apical lat ischemia;  c. 04/2015 PCI: LM nl, LAD 60p (FFR 0.83), D1 60ost, LCX 50p, 40m (2.75x15 Xience Alpine DES), OM1/2/3 min irregs, RCA 120m CTO.  . Depression    intolerant of paxil, wellbutrin, zoloft  . GERD (gastroesophageal reflux disease)   . Hayfever   . headaches   . Heart murmur   . High cholesterol   . Hypertension   . Insomnia   . Myocardial infarction ([place])   . RLS (restless legs syndrome)   . Sciatica   . Smoker    a. quit 01/2014.    PAST SURGICAL HISTORY:   Past Surgical History:  Procedure Laterality Date  . ANGIOPLASTY  1997  . APPENDECTOMY  1978   open  . [place]  . CARDIAC CATHETERIZATION N/A 04/30/2015   Procedure: Left Heart Cath;  Surgeon: Wellington Hampshire, MD;  Location: [facility];  Service: Cardiovascular;   Laterality: N/A;  . CARPAL TUNNEL RELEASE    . CESAREAN SECTION  1980  . CESAREAN SECTION  1984  . CHOLECYSTECTOMY N/A 03/30/2016   Procedure: LAPAROSCOPIC CHOLECYSTECTOMY WITH INTRAOPERATIVE CHOLANGIOGRAM;  Surgeon: Jules Husbands, MD;  Location: [facility];  Service: General;  Laterality: N/A;  . [place]  [facility]  . HERNIA REPAIR  11/2016   [facility]; Dr. Gaynelle Arabian  . VAGINAL HYSTERECTOMY      SOCIAL HISTORY:   Social History   Tobacco Use  . Smoking status: Former Smoker    Packs/day: 0.33    Years: 20.00    Pack years: 6.60    Types: Cigarettes    Quit date: 02/13/2015    Years since quitting: 4.5  . Smokeless tobacco: Never Used  Substance Use Topics  . Alcohol use: Yes    Alcohol/week: 0.0 standard drinks    Comment: rare    FAMILY HISTORY:   Family History  Problem Relation Age of Onset  . Heart disease Mother   . Diabetes Mother   . Hypertension Mother   . Heart attack Mother   . Alcohol abuse Father   .  Heart disease Father   . Heart attack Father   . Breast cancer Maternal Grandmother   . Colon cancer Neg Hx   . Bladder Cancer Neg Hx   . Kidney cancer Neg Hx     DRUG ALLERGIES:   Allergies  Allergen Reactions  . Adhesive [Tape] Other (See Comments)    Don't use flex/fabric bandaids  . Crestor [Rosuvastatin Calcium] Other (See Comments)    aches  . Effexor [Venlafaxine] Other (See Comments)    irritable  . Lipitor [Atorvastatin] Other (See Comments)    Intolerant  . Paroxetine Hcl   . Sertraline Hcl   . Wellbutrin [Bupropion Hcl]     REVIEW OF SYSTEMS:   Review of Systems  Constitutional: Negative for chills, fever and malaise/fatigue.  HENT: Negative for sore throat.   Eyes: Negative for blurred vision and double vision.  Respiratory: Negative for cough, hemoptysis, shortness of breath, wheezing and stridor.    Cardiovascular: Negative for chest pain, palpitations, orthopnea and leg swelling.       Belching and chest discomfort  Gastrointestinal: Negative for abdominal pain, blood in stool, diarrhea, melena, nausea and vomiting.  Genitourinary: Negative for dysuria, flank pain and hematuria.  Musculoskeletal: Negative for back pain and joint pain.  Neurological: Negative for dizziness, sensory change, focal weakness, seizures, loss of consciousness, weakness and headaches.  Endo/Heme/Allergies: Negative for polydipsia.  Psychiatric/Behavioral: Negative for depression. The patient is not nervous/anxious.     MEDICATIONS AT HOME:   Prior to Admission medications   Medication Sig Start Date End Date Taking? Authorizing Provider  ALPRAZolam Duanne Moron) 1 MG tablet Take 1 tablet (1 mg total) by mouth 3 (three) times daily as needed for anxiety. 06/13/19  Yes Tonia Ghent, MD  aspirin 81 MG chewable tablet Chew 81 mg by mouth daily.   Yes [provider]  atorvastatin (LIPITOR) 20 MG tablet Take 1 tablet (20 mg total) by mouth daily. Pt must keep appointment for additional refills, Thank you. 01/04/19  Yes Tonia Ghent, MD  ezetimibe (ZETIA) 10 MG tablet Take 1 tablet (10 mg total) by mouth daily. 01/15/18 09/04/19 Yes Wellington Hampshire, MD  fluticasone (FLONASE) 50 MCG/ACT nasal spray USE 2 SPRAYS IN EACH NOSTRIL ONE TIME DAILY 03/05/19  Yes Tower, Wynelle Fanny, MD  isosorbide mononitrate (IMDUR) 60 MG 24 hr tablet Take 1 tablet (60 mg total) by mouth daily. 01/04/19  Yes Tonia Ghent, MD  metoprolol tartrate (LOPRESSOR) 25 MG tablet TAKE 1 TABLET TWICE DAILY 06/14/19  Yes Tonia Ghent, MD  Multiple Vitamin (MULTIVITAMIN) tablet Take 1 tablet by mouth daily.     Yes [provider]  pantoprazole (PROTONIX) 40 MG tablet Take 1 tablet (40 mg total) by mouth 2 (two) times daily. 09/02/19  Yes Tonia Ghent, MD  senna (SENOKOT) 8.6 MG tablet Take 1 tablet by mouth as needed for  constipation.    Yes [provider]  zolpidem (AMBIEN) 10 MG tablet Take 1 tablet (10 mg total) by mouth at bedtime as needed. for sleep 08/07/19  Yes Tonia Ghent, MD  nitroGLYCERIN (NITROSTAT) 0.4 MG SL tablet PLACE 1 TABLET (0.4 MG TOTAL) UNDER THE TONGUE EVERY 5 (FIVE) MINUTES AS NEEDED FOR CHEST PAIN. 04/30/18   Wellington Hampshire, MD  nystatin (MYCOSTATIN/NYSTOP) powder Apply topically 3 (three) times daily. 02/15/18   Tonia Ghent, MD  VENTOLIN HFA 108 (90 Base) MCG/ACT inhaler INHALE 2 PUFFS EVERY 6  HOURS AS NEEDED FOR WHEEZING OR  SHORTNESS OF BREATH. 02/12/16   Tonia Ghent, MD      VITAL SIGNS:  Blood pressure (!) 171/103, pulse 98, resp. rate 14, height 5' (1.524 m), weight 96.6 kg, SpO2 98 %.  PHYSICAL EXAMINATION:  Physical Exam  GENERAL:  63 y.o.-year-old patient lying in the bed with no acute distress.  Morbid obesity. EYES: Pupils equal, round, reactive to light and accommodation. No scleral icterus. Extraocular muscles intact.  HEENT: Head atraumatic, normocephalic.  NECK:  Supple, no jugular venous distention. No thyroid enlargement, no tenderness.  LUNGS: Normal breath sounds bilaterally, no wheezing, rales,rhonchi or crepitation. No use of accessory muscles of respiration.  CARDIOVASCULAR: S1, S2 normal. No murmurs, rubs, or gallops.  ABDOMEN: Soft, nontender, nondistended. Bowel sounds present. No organomegaly or mass.  EXTREMITIES: No pedal edema, cyanosis, or clubbing.  NEUROLOGIC: Cranial nerves II through XII are intact. Muscle strength 5/5 in all extremities. Sensation intact. Gait not checked.  PSYCHIATRIC: The patient is alert and oriented x 3.  SKIN: No obvious rash, lesion, or ulcer.   LABORATORY PANEL:   CBC Recent Labs  Lab 09/04/19 1825  WBC 12.1*  HGB 12.0  HCT 36.1  PLT 314   ------------------------------------------------------------------------------------------------------------------  Chemistries  Recent Labs  Lab  09/04/19 1825  NA 134*  K 3.5  CL 99  CO2 23  GLUCOSE 140*  BUN 8  CREATININE 0.77  CALCIUM 9.4  AST 31  ALT 22  ALKPHOS 116  BILITOT 1.5*   ------------------------------------------------------------------------------------------------------------------  Cardiac Enzymes No results for input(s): TROPONINI in the last 168 hours. ------------------------------------------------------------------------------------------------------------------  RADIOLOGY:  Dg Chest Portable 1 View  Result Date: 09/04/2019 CLINICAL DATA:  Chest pain EXAM: PORTABLE CHEST 1 VIEW COMPARISON:  Chest radiograph dated 09/02/2019 FINDINGS: The heart size and mediastinal contours are within normal limits. Both lungs are clear. The visualized skeletal structures are unremarkable. IMPRESSION: No active pulmonary disease. Electronically Signed   By: Zerita Boers M.D.   On: 09/04/2019 19:23      IMPRESSION AND PLAN:   Non-STEMI. The patient will be admitted to telemetry floor. Continue heparin drip, follow-up cardiologist for possible cardiac cath tomorrow morning. Continue aspirin, Lopressor and Lipitor, check lipid panel.  Hypertension.  Continue home hypertension medication. Hyperlipidemia.  Continue home Lipitor. Morbid obesity.  Diet control and follow-up PCP. Tobacco abuse.  Smoking cessation was counseled for 3 to 4 minutes, nicotine patch. All the records are reviewed and case discussed with ED provider. Management plans discussed with the patient, her husband and they are in agreement.  CODE STATUS: Full code.  TOTAL TIME TAKING CARE OF THIS PATIENT: 57 minutes.    Demetrios Loll M.D on 09/04/2019 at 8:37 PM  Between 7am to 6pm - Pager - [phone]  After 6pm go to [email or web address] - password [email or web address] [facility]  [facility]  Office  [phone]  CC: Primary care physician; Tonia Ghent, MD   Note: This dictation was prepared with Dragon dictation along with  smaller phrase technology. Any transcriptional errors that result from this process are unin

## 2019-09-05 ENCOUNTER — Inpatient Hospital Stay (HOSPITAL_COMMUNITY)
Admit: 2019-09-05 | Discharge: 2019-09-05 | Disposition: A | Discharge status: Home / Self Care | Payer: Medicare HMO | Attending: Internal Medicine | Admitting: Internal Medicine

## 2019-09-05 ENCOUNTER — Encounter
Admission: EM | Disposition: A | Discharge status: Home / Self Care | Payer: Self-pay | Source: Home / Self Care | Attending: Internal Medicine

## 2019-09-05 DIAGNOSIS — I10 Essential (primary) hypertension: Secondary | ICD-10-CM

## 2019-09-05 DIAGNOSIS — I214 Non-ST elevation (NSTEMI) myocardial infarction: Secondary | ICD-10-CM

## 2019-09-05 DIAGNOSIS — I200 Unstable angina: Secondary | ICD-10-CM

## 2019-09-05 DIAGNOSIS — F17200 Nicotine dependence, unspecified, uncomplicated: Secondary | ICD-10-CM

## 2019-09-05 DIAGNOSIS — I25110 Atherosclerotic heart disease of native coronary artery with unstable angina pectoris: Secondary | ICD-10-CM

## 2019-09-05 DIAGNOSIS — I361 Nonrheumatic tricuspid (valve) insufficiency: Secondary | ICD-10-CM

## 2019-09-05 DIAGNOSIS — Z955 Presence of coronary angioplasty implant and graft: Secondary | ICD-10-CM

## 2019-09-05 HISTORY — PX: CORONARY STENT INTERVENTION: CATH118234

## 2019-09-05 HISTORY — PX: LEFT HEART CATH AND CORONARY ANGIOGRAPHY: CATH118249

## 2019-09-05 LAB — CBC
HCT: 33.5 % — ABNORMAL LOW (ref 36.0–46.0)
Hemoglobin: 10.9 g/dL — ABNORMAL LOW (ref 12.0–15.0)
MCH: 27.3 pg (ref 26.0–34.0)
MCHC: 32.5 g/dL (ref 30.0–36.0)
MCV: 84 fL (ref 80.0–100.0)
Platelets: 305 10*3/uL (ref 150–400)
RBC: 3.99 MIL/uL (ref 3.87–5.11)
RDW: 13.1 % (ref 11.5–15.5)
WBC: 9.5 10*3/uL (ref 4.0–10.5)
nRBC: 0 % (ref 0.0–0.2)

## 2019-09-05 LAB — LIPID PANEL
Cholesterol: 124 mg/dL (ref 0–200)
HDL: 32 mg/dL — ABNORMAL LOW (ref >40)
LDL Cholesterol: 66 mg/dL (ref 0–99)
Total CHOL/HDL Ratio: 3.9 RATIO
Triglycerides: 130 mg/dL (ref <150)
VLDL: 26 mg/dL (ref 0–40)

## 2019-09-05 LAB — TROPONIN I (HIGH SENSITIVITY)
Troponin I (High Sensitivity): 10617 ng/L (ref <18)
Troponin I (High Sensitivity): 16315 ng/L (ref <18)

## 2019-09-05 LAB — ECHOCARDIOGRAM COMPLETE
Height: 59 in
Weight: 3411.2 oz

## 2019-09-05 LAB — HIV ANTIBODY (ROUTINE TESTING W REFLEX): HIV Screen 4th Generation wRfx: NONREACTIVE

## 2019-09-05 LAB — SARS CORONAVIRUS 2 (TAT 6-24 HRS): SARS Coronavirus 2: NEGATIVE

## 2019-09-05 LAB — HEPARIN LEVEL (UNFRACTIONATED)
Heparin Unfractionated: 0.15 IU/mL — ABNORMAL LOW (ref 0.30–0.70)
Heparin Unfractionated: 0.15 IU/mL — ABNORMAL LOW (ref 0.30–0.70)
Heparin Unfractionated: <0.1 IU/mL — ABNORMAL LOW (ref 0.30–0.70)

## 2019-09-05 SURGERY — LEFT HEART CATH AND CORONARY ANGIOGRAPHY
Anesthesia: Moderate Sedation

## 2019-09-05 MED ORDER — FENTANYL CITRATE (PF) 100 MCG/2ML IJ SOLN
INTRAMUSCULAR | Status: DC | PRN
  Administered 2019-09-05 (×2): 25 ug via INTRAVENOUS

## 2019-09-05 MED ORDER — ONDANSETRON HCL 4 MG/2ML IJ SOLN: 4.0000 mg | Freq: Four times a day (QID) | INTRAMUSCULAR | Status: DC | PRN

## 2019-09-05 MED ORDER — SODIUM CHLORIDE 0.9% FLUSH
3.0000 mL | Freq: Two times a day (BID) | INTRAVENOUS | Status: DC
  Administered 2019-09-05: 3 mL via INTRAVENOUS

## 2019-09-05 MED ORDER — IOHEXOL 300 MG/ML  SOLN
INTRAMUSCULAR | Status: DC | PRN
  Administered 2019-09-05: 70 mL

## 2019-09-05 MED ORDER — SODIUM CHLORIDE 0.9 % IV SOLN: 250.0000 mL | INTRAVENOUS | Status: DC | PRN

## 2019-09-05 MED ORDER — SODIUM CHLORIDE 0.9 % WEIGHT BASED INFUSION: 3.0000 mL/kg/h | INTRAVENOUS | Status: DC

## 2019-09-05 MED ORDER — LABETALOL HCL 5 MG/ML IV SOLN: 10.0000 mg | INTRAVENOUS | Status: AC | PRN

## 2019-09-05 MED ORDER — ASPIRIN 81 MG PO CHEW
81.0000 mg | CHEWABLE_TABLET | Freq: Every day | ORAL | Status: DC
  Administered 2019-09-06: 81 mg via ORAL
  Filled 2019-09-05: qty 1

## 2019-09-05 MED ORDER — SODIUM CHLORIDE 0.9% FLUSH: 3.0000 mL | INTRAVENOUS | Status: DC | PRN

## 2019-09-05 MED ORDER — SODIUM CHLORIDE 0.9% FLUSH
3.0000 mL | Freq: Two times a day (BID) | INTRAVENOUS | Status: DC
  Administered 2019-09-06: 3 mL via INTRAVENOUS

## 2019-09-05 MED ORDER — HEPARIN BOLUS VIA INFUSION
2000.0000 [IU] | Freq: Once | INTRAVENOUS | Status: AC
  Administered 2019-09-05: 2000 [IU] via INTRAVENOUS
  Filled 2019-09-05: qty 2000

## 2019-09-05 MED ORDER — SODIUM CHLORIDE 0.9% FLUSH
3.0000 mL | INTRAVENOUS | Status: DC | PRN
  Administered 2019-09-06: 3 mL via INTRAVENOUS
  Filled 2019-09-05: qty 3

## 2019-09-05 MED ORDER — CLOPIDOGREL BISULFATE 75 MG PO TABS
75.0000 mg | ORAL_TABLET | Freq: Every day | ORAL | Status: DC
  Administered 2019-09-06: 75 mg via ORAL
  Filled 2019-09-05: qty 1

## 2019-09-05 MED ORDER — NITROGLYCERIN 1 MG/10 ML FOR IR/CATH LAB
INTRA_ARTERIAL | Status: DC | PRN
  Administered 2019-09-05: 200 ug via INTRACORONARY
  Administered 2019-09-05: 200 ug

## 2019-09-05 MED ORDER — HYDRALAZINE HCL 20 MG/ML IJ SOLN: 10.0000 mg | INTRAMUSCULAR | Status: AC | PRN

## 2019-09-05 MED ORDER — MIDAZOLAM HCL 2 MG/2ML IJ SOLN
INTRAMUSCULAR | Status: DC | PRN
  Administered 2019-09-05: 1 mg via INTRAVENOUS
  Administered 2019-09-05: 0.5 mg via INTRAVENOUS

## 2019-09-05 MED ORDER — MIDAZOLAM HCL 2 MG/2ML IJ SOLN
INTRAMUSCULAR | Status: AC
  Filled 2019-09-05: qty 2

## 2019-09-05 MED ORDER — SODIUM CHLORIDE 0.9 % WEIGHT BASED INFUSION
1.0000 mL/kg/h | INTRAVENOUS | Status: AC
  Administered 2019-09-05: 1 mL/kg/h via INTRAVENOUS

## 2019-09-05 MED ORDER — ASPIRIN 81 MG PO CHEW
CHEWABLE_TABLET | ORAL | Status: AC
  Filled 2019-09-05: qty 3

## 2019-09-05 MED ORDER — PERFLUTREN LIPID MICROSPHERE
1.0000 mL | INTRAVENOUS | Status: AC | PRN
  Administered 2019-09-05: 2 mL via INTRAVENOUS
  Filled 2019-09-05: qty 10

## 2019-09-05 MED ORDER — HEPARIN BOLUS VIA INFUSION
1000.0000 [IU] | Freq: Once | INTRAVENOUS | Status: AC
  Administered 2019-09-05: 1000 [IU] via INTRAVENOUS
  Filled 2019-09-05: qty 1000

## 2019-09-05 MED ORDER — SODIUM CHLORIDE 0.9 % IV SOLN
INTRAVENOUS | Status: AC | PRN
  Administered 2019-09-05: 1.75 mg/kg/h via INTRAVENOUS

## 2019-09-05 MED ORDER — ACETAMINOPHEN 325 MG PO TABS: 650.0000 mg | ORAL_TABLET | ORAL | Status: DC | PRN

## 2019-09-05 MED ORDER — BIVALIRUDIN BOLUS VIA INFUSION - CUPID
INTRAVENOUS | Status: DC | PRN
  Administered 2019-09-05: 72.525 mg via INTRAVENOUS

## 2019-09-05 MED ORDER — ASPIRIN 81 MG PO CHEW
CHEWABLE_TABLET | ORAL | Status: DC | PRN
  Administered 2019-09-05: 243 mg via ORAL

## 2019-09-05 MED ORDER — HEPARIN (PORCINE) IN NACL 1000-0.9 UT/500ML-% IV SOLN
INTRAVENOUS | Status: DC | PRN
  Administered 2019-09-05: 500 mL

## 2019-09-05 MED ORDER — CLOPIDOGREL BISULFATE 75 MG PO TABS
ORAL_TABLET | ORAL | Status: DC | PRN
  Administered 2019-09-05: 600 mg via ORAL

## 2019-09-05 MED ORDER — FENTANYL CITRATE (PF) 100 MCG/2ML IJ SOLN
INTRAMUSCULAR | Status: AC
  Filled 2019-09-05: qty 2

## 2019-09-05 MED ORDER — IOHEXOL 300 MG/ML  SOLN
INTRAMUSCULAR | Status: DC | PRN
  Administered 2019-09-05: 95 mL via INTRA_ARTERIAL

## 2019-09-05 MED ORDER — BIVALIRUDIN TRIFLUOROACETATE 250 MG IV SOLR
INTRAVENOUS | Status: AC
  Filled 2019-09-05: qty 250

## 2019-09-05 MED ORDER — CLOPIDOGREL BISULFATE 75 MG PO TABS
ORAL_TABLET | ORAL | Status: AC
  Filled 2019-09-05: qty 8

## 2019-09-05 MED ORDER — NITROGLYCERIN 1 MG/10 ML FOR IR/CATH LAB
INTRA_ARTERIAL | Status: AC
  Filled 2019-09-05: qty 10

## 2019-09-05 MED ORDER — HEPARIN (PORCINE) IN NACL 1000-0.9 UT/500ML-% IV SOLN
INTRAVENOUS | Status: AC
  Filled 2019-09-05: qty 1000

## 2019-09-05 MED ORDER — SODIUM CHLORIDE 0.9 % WEIGHT BASED INFUSION: 1.0000 mL/kg/h | INTRAVENOUS | Status: DC

## 2019-09-05 SURGICAL SUPPLY — 19 items
BALLN TREK RX 2.25X12 (BALLOONS) ×3
BALLN ~~LOC~~ TREK RX 2.5X8 (BALLOONS) ×3
BALLOON TREK RX 2.25X12 (BALLOONS) ×1 IMPLANT
BALLOON ~~LOC~~ TREK RX 2.5X8 (BALLOONS) ×1 IMPLANT
CATH INFINITI 5FR ANG PIGTAIL (CATHETERS) ×3 IMPLANT
CATH INFINITI 5FR JL4 (CATHETERS) ×3 IMPLANT
CATH INFINITI JR4 5F (CATHETERS) ×3 IMPLANT
CATH VISTA GUIDE 6FR XB3.5 (CATHETERS) ×3 IMPLANT
DEVICE CLOSURE MYNXGRIP 6/7F (Vascular Products) ×3 IMPLANT
DEVICE INFLAT 30 PLUS (MISCELLANEOUS) ×3 IMPLANT
KIT MANI 3VAL PERCEP (MISCELLANEOUS) ×3 IMPLANT
NEEDLE PERC 18GX7CM (NEEDLE) ×3 IMPLANT
NEEDLE SMART 18G ACCESS (NEEDLE) ×3 IMPLANT
PACK CARDIAC CATH (CUSTOM PROCEDURE TRAY) ×3 IMPLANT
SHEATH AVANTI 5FR X 11CM (SHEATH) ×3 IMPLANT
SHEATH AVANTI 6FR X 11CM (SHEATH) ×3 IMPLANT
STENT RESOLUTE ONYX 2.25X12 (Permanent Stent) ×3 IMPLANT
WIRE ASAHI PROWATER 180CM (WIRE) ×3 IMPLANT
WIRE GUIDERIGHT .035X150 (WIRE) ×3 IMPLANT

## 2019-09-05 NOTE — Progress Notes (Signed)
Patient was transferred from the ED via a stretcher following c/o chest pain. On admission patient was on nitro and heparin gtt. Patient was A&O X4, ambulatory and state her pain to be 7/10. Nitro titrated for chest pain and Percocet PRN administered.

## 2019-09-05 NOTE — Progress Notes (Signed)
*  PRELIMINARY RESULTS* Echocardiogram 2D Echocardiogram has been performed.  Andrea Webster 09/05/2019, 10:20 AM

## 2019-09-05 NOTE — Progress Notes (Signed)
[place] for heparin Indication: chest pain/ACS  Allergies  Allergen Reactions  . Adhesive [Tape] Other (See Comments)    Don't use flex/fabric bandaids  . Crestor [Rosuvastatin Calcium] Other (See Comments)    aches  . Effexor [Venlafaxine] Other (See Comments)    irritable  . Lipitor [Atorvastatin] Other (See Comments)    Intolerant  . Paroxetine Hcl   . Sertraline Hcl   . Wellbutrin [Bupropion Hcl]     Patient Measurements: Height: 4\' 11"  (149.9 cm) Weight: 213 lb 3.2 oz (96.7 kg) IBW/kg (Calculated) : 43.2 Heparin Dosing Weight: 67 kg  Vital Signs: Temp: 98.3 F (36.8 C) (10/15 0710) Temp Source: Oral (10/15 0710) BP: 114/59 (10/15 1138) Pulse Rate: 59 (10/15 0710)  Labs: Recent Labs    09/02/19 1321 09/04/19 1825 09/04/19 2030 09/05/19 0340 09/05/19 0921 09/05/19 1102  HGB 12.3 12.0  --  10.9*  --   --   HCT 36.7 36.1  --  33.5*  --   --   PLT 344.0 314  --  305  --   --   APTT  --  34  --   --   --   --   LABPROT  --  13.9  --   --   --   --   INR  --  1.1  --   --   --   --   HEPARINUNFRC  --   --   --  0.15*  --  0.15*  CREATININE 0.83 0.77  --   --   --   --   TROPONINIHS  --  978* 2,720*  --  10,617*  --     Estimated Creatinine Clearance: 75.3 mL/min (by C-G formula based on SCr of 0.77 mg/dL).  Assessment: 63 year old female admitted with chest pain worse after eating solid food and accompanied by belching. Patient recently saw cardiologist who recommends GI workup as well. Patient originally scheduled for Lexiscan Myoview next week but states she cannot wait that long. Pharmacy consulted for heparin drip.  Heparin subtherapeutic x 2.  S/w nurse, no interruptions reported in therapy.  1015 @ 0340 HL = 0.15, subtherapeutic 1015 @ 1100 HL = 0.15, subtherapeutic  Goal of Therapy:  Heparin level 0.3-0.7 units/ml Monitor platelets by anticoagulation protocol: Yes   Plan:  Give 2000 unit bolus.  Increase  rate from 1000 units/hour to 1250 units/hour Recheck Heparin level 6 hours after rate increased. CBC with morning labs.  Gerald Dexter, PharmD 09/05/2019,11:58 AM

## 2019-09-05 NOTE — Progress Notes (Signed)
Troponin 10,617 reported to Dr. Rockey Situ, Christell Faith and Dr. Darvin Neighbours. Patient continues to rest with 3-4/10 chest pain. No changes to plan. Cath today.

## 2019-09-05 NOTE — Progress Notes (Addendum)
Patient back from cath. VS stable. No complaints. She has to lay flat until 1907. Will continue to monitor. Heparin was D/c'd, patient received angiomax and has started plavix.

## 2019-09-05 NOTE — Progress Notes (Signed)
[place] for heparin Indication: chest pain/ACS  Allergies  Allergen Reactions  . Adhesive [Tape] Other (See Comments)    Don't use flex/fabric bandaids  . Crestor [Rosuvastatin Calcium] Other (See Comments)    aches  . Effexor [Venlafaxine] Other (See Comments)    irritable  . Lipitor [Atorvastatin] Other (See Comments)    Intolerant  . Paroxetine Hcl   . Sertraline Hcl   . Wellbutrin [Bupropion Hcl]     Patient Measurements: Height: 4\' 11"  (149.9 cm) Weight: 213 lb 3.2 oz (96.7 kg) IBW/kg (Calculated) : 43.2 Heparin Dosing Weight: 68 kg  Vital Signs: Temp: 97.7 F (36.5 C) (10/15 0326) Temp Source: Oral (10/15 0326) BP: 119/71 (10/15 0403) Pulse Rate: 66 (10/15 0403)  Labs: Recent Labs    09/02/19 1321 09/04/19 1825 09/04/19 2030 09/05/19 0340  HGB 12.3 12.0  --  10.9*  HCT 36.7 36.1  --  33.5*  PLT 344.0 314  --  305  APTT  --  34  --   --   LABPROT  --  13.9  --   --   INR  --  1.1  --   --   HEPARINUNFRC  --   --   --  0.15*  CREATININE 0.83 0.77  --   --   TROPONINIHS  --  978* 2,720*  --     Estimated Creatinine Clearance: 75.3 mL/min (by C-G formula based on SCr of 0.77 mg/dL).  Assessment: 63 year old female admitted with chest pain worse after eating solid food and accompanied by belching. Patient recently saw cardiologist who recommends GI workup as well. Patient originally scheduled for Lexiscan Myoview next week but states she cannot wait that long. Pharmacy consulted for heparin drip.  10/15 @ 0340 HL = 0.15, subtherapeutic  Goal of Therapy:  Heparin level 0.3-0.7 units/ml Monitor platelets by anticoagulation protocol: Yes   Plan:  Heparin 1000 unit bolus and increase heparin drip to 1000 units/hr. Recheck Heparin level 6 hours after rate increased. CBC with morning labs.  Ena Dawley, PharmD 09/05/2019,4:40 AM

## 2019-09-05 NOTE — Progress Notes (Signed)
Cardiovascular and Pulmonary Nurse Navigator Note:   63 y.o. female with a hx of CAD s/p MI at age 22 with PTCA performed to an unknown vessel at that time, s/p PCI/DES to the LCx in 2016 with known residual CAD as detailed below, ongoing tobacco abuse (previously quit briefly in 01/2015), HTN, HLD, obesity, anxiety, strong family history of CAD, and prior cholecystectomy.  Patient presented to the ER with Chest Pain.  Has ruled in for NSTEMI.  Troponin up to 16,000.    Smoker Long discussion with her concerning smoking cessation, husband at the bedside We have encouraged her to continue to work on weaning her cigarettes and smoking cessation. She will continue to work on this and does not want any assistance with chantix.  They are going to do it together   LEFT HEART CATH AND CORONARY ANGIOGRAPHY  Conclusion   Prox LAD lesion is 50% stenosed.  Ost 1st Diag to 1st Diag lesion is 60% stenosed.  Prox Cx lesion is 90% stenosed.  Previously placed Mid Cx to Dist Cx drug eluting stent is widely patent.  Balloon angioplasty was performed.  Mid RCA lesion is 100% stenosed.  LPAV lesion is 60% stenosed.  The left ventricular ejection fraction is 50-55% by visual estimate.  The left ventricular systolic function is normal.  There is no mitral valve regurgitation.   Intervention:   Andrea Webster CARDIAC CATHETERIZATION Order# [id number] Reading physician: Isaias Cowman, MD Ordering physician: Sindy Messing Study date: 09/05/19    Panel Physicians Referring Physician Case Authorizing Physician  Isaias Cowman, MD (Primary)  Rise Mu, PA-C  Procedures  CORONARY STENT INTERVENTION  Conclusion    Prox Cx lesion is 90% stenosed.  Previously placed Mid Cx to Dist Cx stent (unknown type) is widely patent.  A drug-eluting stent was successfully placed using a Adams [id number].  Post intervention, there is a 0% residual stenosis.   1.  Vessel  CAD with 90% stenosis proximal left circumflex 2.  Successful PCI with DES proximal left circumflex  EDUCATION:   "Heart Attack Bouncing Back" booklet given and reviewed with patient. Discussed the definition of CAD. Reviewed the location of CAD and where her stent was placed. Informed patient she will be given a stent card. Explained the purpose of the stent card. Instructed patient to keep stent card in her wallet.  Discussed modifiable risk factors including controlling blood pressure, cholesterol, and blood sugar; following heart healthy diet; maintaining healthy weight; exercise; and smoking cessation.    Discussed cardiac medications including rationale for taking, mechanisms of action, and side effects. Stressed the importance of taking medications as prescribed.  Discussed emergency plan for heart attack symptoms. Patient verbalized understanding of need to call 911 and not to drive herself to ER if having cardiac symptoms / chest pain.   Diet of low sodium, low fat, low cholesterol heart healthy diet discussed. Information on diet provided.   Smoking Cessation - Patient is a CURRENT every day smoker. Patient has been advised to quit by her cardiologist. "Thinking about Quitting - Yes You Can!" informational sheet reviewed with patient.  Exercise - Benefits of exercised discussed. Informed patient her cardiologist has referred  her to outpatient Cardiac Rehab. An overview of the program was provided. Brochure and informational letter along with CPT billing codes were given to patient. Patient is interested in participating. Patient plans to check with her insurance company to see what her out-of-pocket expenses will be.  Patient appreciative of the above information.    Andrea Epley, RN, BSN, [facility]  Cardiovascular & Pulmonary Nurse Navigator  Direct Line: [phone]  Department Phone #: [phone] Fax: [phone]  Email Address:  [email or web address]

## 2019-09-05 NOTE — Progress Notes (Signed)
Dr. Rockey Situ rounding. Patient will go for cardiac cath in a couple hours. Patient ate a few bites of yogurt earlier this morning, but is aware to be NPO for the procedure. Per MD, hold morning meds except for ASA due to borderline BP. Aware that nitro drip has not been running since about 0530 due to soft blood pressure. Current pain 3-4/10. Will continue to monitor.

## 2019-09-05 NOTE — Progress Notes (Signed)
,   Dr. Marcille Blanco was notified of patient's SBP in the 90s, Nitro gtt stopped. Patient A&O X4.

## 2019-09-05 NOTE — Consult Note (Signed)
Cardiology Consultation:   Patient ID: Andrea Webster; [id number]; October 25, 1956   Admit date: 09/04/2019 Date of Consult: 09/05/2019  Primary Care Provider: Tonia Ghent, MD Primary Cardiologist: Fletcher Anon   Patient Profile:   Andrea Webster is a 63 y.o. female with a hx of CAD s/p MI at age 73 with PTCA performed to an unknown vessel at that time, s/p PCI/DES to the LCx in 2016 with known residual CAD as detailed below, ongoing tobacco abuse (previously quit briefly in 01/2015), HTN, HLD, obesity, anxiety, strong family history of CAD, and prior cholecystectomy  who is being seen today for the evaluation of NSTEMI at the request of Dr. Bridgett Larsson.  History of Present Illness:   Andrea Webster underwent LHC in 04/2015 which showed CTO of the RCA, severe mid LCx disease with 90% stenosis, and moderate proximal LAD disease. FFR of the LAD was not clinically significant at 0.83. The LCx was felt to be the culprit vessel and was successfully treated with PCI/DES with a 2.75 x 15 mm Xience Alpine DES. She was seen in 2017 for preoperative cardiac evaluation for hernia repair and underwent Lexiscan Myoview in 10/2016 that showed a small region of ischemia in the mid to apical lateral wall, unable to definitively exclude attenuation artifact given high uptake in the inferoseptal wall, normal wall motion with an EF of 83%, no EKG changes concerning for ischemia. Overall, this was a low risk scan.   She was recently seen in the office on 08/29/2019 with a several day history of chest pain/increased heartburn with belching with pain described as an elephant sitting on her chest. These symptoms were only noted after eating solid foods. There was no exertional chest pain or SOB. In this setting, she was scheduled for a [employer].   Prior to undergoing her nuclear stress test, she presented to [facility] on 09/04/2019 with worsening substernal chest pain that again was only noted when eating solid foods and was  without exertional component or associated symptoms. Symptoms were so severe though, leading her to present to [facility].   Upon the patient's arrival to [facility] they were found to have BP initially elevated in the [id number] systolic at presentation and improved currently. EKG as below, CXR showed no acute cardiopulmonary process. Labs showed high sensitivity troponin 978 with a delta of 2720, WBC 12.1-->9.5, HGB 12.0-->10.9, PLT 314, K+ 3.5, SCr 0.77, albumin 4.2, AST/ALT normal, lipase 21, TC 124, TG 130, HDL 32, LDL 66. She was given ASA 324, SL NTG, morphine and started on heparin gtt. Currently, continues to note some chest pain, though improved.    Past Medical History:  Diagnosis Date  . Anxiety   . Arthritis   . Baker's cyst    x 2  . CAD (coronary artery disease)    a. s/p MI & PTCA;  b. Ex MV: basal inflat, mid inflat, apical inf, apical lat ischemia;  c. 04/2015 PCI: LM nl, LAD 60p (FFR 0.83), D1 60ost, LCX 50p, 61m (2.75x15 Xience Alpine DES), OM1/2/3 min irregs, RCA 113m CTO.  . Depression    intolerant of paxil, wellbutrin, zoloft  . GERD (gastroesophageal reflux disease)   . Hayfever   . headaches   . Heart murmur   . High cholesterol   . Hypertension   . Insomnia   . Myocardial infarction ([place])   . RLS (restless legs syndrome)   . Sciatica   . Smoker    a. quit 01/2014.  Past Surgical History:  Procedure Laterality Date  . ANGIOPLASTY  1997  . APPENDECTOMY  1978   open  . [place]  . CARDIAC CATHETERIZATION N/A 04/30/2015   Procedure: Left Heart Cath;  Surgeon: Wellington Hampshire, MD;  Location: [facility];  Service: Cardiovascular;  Laterality: N/A;  . CARPAL TUNNEL RELEASE    . CESAREAN SECTION  1980  . CESAREAN SECTION  1984  . CHOLECYSTECTOMY N/A 03/30/2016   Procedure: LAPAROSCOPIC CHOLECYSTECTOMY WITH INTRAOPERATIVE CHOLANGIOGRAM;  Surgeon: Jules Husbands, MD;  Location: [facility] ORS;  Service: General;  Laterality:  N/A;  . [place]  [facility]  . HERNIA REPAIR  11/2016   [facility]; Dr. Gaynelle Arabian  . VAGINAL HYSTERECTOMY       Home Meds: Prior to Admission medications   Medication Sig Start Date End Date Taking? Authorizing Provider  ALPRAZolam Duanne Moron) 1 MG tablet Take 1 tablet (1 mg total) by mouth 3 (three) times daily as needed for anxiety. 06/13/19  Yes Tonia Ghent, MD  aspirin 81 MG chewable tablet Chew 81 mg by mouth daily.   Yes [provider]  atorvastatin (LIPITOR) 20 MG tablet Take 1 tablet (20 mg total) by mouth daily. Pt must keep appointment for additional refills, Thank you. 01/04/19  Yes Tonia Ghent, MD  ezetimibe (ZETIA) 10 MG tablet Take 1 tablet (10 mg total) by mouth daily. 01/15/18 09/04/19 Yes Wellington Hampshire, MD  fluticasone (FLONASE) 50 MCG/ACT nasal spray USE 2 SPRAYS IN EACH NOSTRIL ONE TIME DAILY 03/05/19  Yes Tower, Wynelle Fanny, MD  isosorbide mononitrate (IMDUR) 60 MG 24 hr tablet Take 1 tablet (60 mg total) by mouth daily. 01/04/19  Yes Tonia Ghent, MD  metoprolol tartrate (LOPRESSOR) 25 MG tablet TAKE 1 TABLET TWICE DAILY 06/14/19  Yes Tonia Ghent, MD  Multiple Vitamin (MULTIVITAMIN) tablet Take 1 tablet by mouth daily.     Yes [provider]  pantoprazole (PROTONIX) 40 MG tablet Take 1 tablet (40 mg total) by mouth 2 (two) times daily. 09/02/19  Yes Tonia Ghent, MD  senna (SENOKOT) 8.6 MG tablet Take 1 tablet by mouth as needed for constipation.    Yes [provider]  zolpidem (AMBIEN) 10 MG tablet Take 1 tablet (10 mg total) by mouth at bedtime as needed. for sleep 08/07/19  Yes Tonia Ghent, MD  nitroGLYCERIN (NITROSTAT) 0.4 MG SL tablet PLACE 1 TABLET (0.4 MG TOTAL) UNDER THE TONGUE EVERY 5 (FIVE) MINUTES AS NEEDED FOR CHEST PAIN. 04/30/18   Wellington Hampshire, MD  nystatin (MYCOSTATIN/NYSTOP) powder Apply  topically 3 (three) times daily. 02/15/18   Tonia Ghent, MD  VENTOLIN HFA 108 [id number] Base) MCG/ACT inhaler INHALE 2 PUFFS EVERY 6  HOURS AS NEEDED FOR WHEEZING OR SHORTNESS OF BREATH. 02/12/16   Tonia Ghent, MD    Inpatient Medications: Scheduled Meds: . aspirin EC  81 mg Oral Daily  . atorvastatin  40 mg Oral q1800  . ezetimibe  10 mg Oral Daily  . fluticasone  1 spray Each Nare Daily  . isosorbide mononitrate  60 mg Oral Daily  . metoprolol tartrate  25 mg Oral BID  . pantoprazole  40 mg Oral BID  . sodium chloride flush  3 mL Intravenous Once   Continuous Infusions: .  sodium chloride 75 mL/hr at 09/04/19 2304  . heparin 1,000 Units/hr (09/05/19 0508)  . nitroGLYCERIN Stopped (09/05/19 0444)   PRN Meds: acetaminophen, ALPRAZolam, metoprolol tartrate, morphine injection, nitroGLYCERIN, ondansetron (ZOFRAN) IV, oxyCODONE-acetaminophen, senna, zolpidem  Allergies:   Allergies  Allergen Reactions  . Adhesive [Tape] Other (See Comments)    Don't use flex/fabric bandaids  . Crestor [Rosuvastatin Calcium] Other (See Comments)    aches  . Effexor [Venlafaxine] Other (See Comments)    irritable  . Lipitor [Atorvastatin] Other (See Comments)    Intolerant  . Paroxetine Hcl   . Sertraline Hcl   . Wellbutrin [Bupropion Hcl]     Social History:   Social History   Socioeconomic History  . Marital status: Widowed    Spouse name: Not on file  . Number of children: Not on file  . Years of education: Not on file  . Highest education level: Not on file  Occupational History  . Not on file  Social Needs  . Financial resource strain: Not on file  . Food insecurity    Worry: Not on file    Inability: Not on file  . Transportation needs    Medical: Not on file    Non-medical: Not on file  Tobacco Use  . Smoking status: Former Smoker    Packs/day: 0.33    Years: 20.00    Pack years: 6.60    Types: Cigarettes    Quit date: 02/13/2015    Years since quitting: 4.5  .  Smokeless tobacco: Never Used  Substance and Sexual Activity  . Alcohol use: Yes    Alcohol/week: 0.0 standard drinks    Comment: rare  . Drug use: No  . Sexual activity: Not on file  Lifestyle  . Physical activity    Days per week: Not on file    Minutes per session: Not on file  . Stress: Not on file  Relationships  . Social Herbalist on phone: Not on file    Gets together: Not on file    Attends religious service: Not on file    Active member of club or organization: Not on file    Attends meetings of clubs or organizations: Not on file    Relationship status: Not on file  . Intimate partner violence    Fear of current or ex partner: Not on file    Emotionally abused: Not on file    Physically abused: Not on file    Forced sexual activity: Not on file  Other Topics Concern  . Not on file  Social History Narrative   From [place], to [place] 2011   Widow as of 2001   1 son prev incarcerated, out as of 2019, infrequent contact as of 2019   1 daughter     Family History:   Family History  Problem Relation Age of Onset  . Heart disease Mother   . Diabetes Mother   . Hypertension Mother   . Heart attack Mother   . Alcohol abuse Father   . Heart disease Father   . Heart attack Father   . Breast cancer Maternal Grandmother   . Colon cancer Neg Hx   . Bladder Cancer Neg Hx   . Kidney cancer Neg Hx     ROS:  Review of Systems  Constitutional: Positive for malaise/fatigue. Negative for chills, diaphoresis, fever and weight loss.  HENT: Negative for congestion.   Eyes: Negative for discharge and redness.  Respiratory: Positive  for shortness of breath. Negative for cough, hemoptysis, sputum production and wheezing.   Cardiovascular: Positive for chest pain. Negative for palpitations, orthopnea, claudication, leg swelling and PND.  Gastrointestinal: Positive for heartburn. Negative for abdominal pain, blood in stool, melena, nausea and vomiting.   Genitourinary: Negative for hematuria.  Musculoskeletal: Negative for falls and myalgias.  Skin: Negative for rash.  Neurological: Positive for weakness. Negative for dizziness, tingling, tremors, sensory change, speech change, focal weakness and loss of consciousness.  Endo/Heme/Allergies: Does not bruise/bleed easily.  Psychiatric/Behavioral: Negative for substance abuse. The patient is not nervous/anxious.   All other systems reviewed and are negative.     Physical Exam/Data:   Vitals:   09/05/19 0535 09/05/19 0630 09/05/19 0710 09/05/19 0754  BP: (!) 91/57 (!) 94/56 (!) 101/58 (!) 104/53  Pulse: 60 61 (!) 59   Resp:      Temp:   98.3 F (36.8 C)   TempSrc:   Oral   SpO2:   97%   Weight:      Height:        Intake/Output Summary (Last 24 hours) at 09/05/2019 0901 Last data filed at 09/05/2019 0600 Gross per 24 hour  Intake 702.61 ml  Output 1500 ml  Net -797.39 ml   Filed Weights   09/04/19 1817 09/04/19 2258 09/05/19 0148  Weight: 96.6 kg 97 kg 96.7 kg   Body mass index is 43.06 kg/m.   Physical Exam: General: Well developed, well nourished, in no acute distress. Head: Normocephalic, atraumatic, sclera non-icteric, no xanthomas, nares without discharge.  Neck: Negative for carotid bruits. JVD not elevated. Lungs: Clear bilaterally to auscultation without wheezes, rales, or rhonchi. Breathing is unlabored. Heart: RRR with S1 S2. II/VI systolic murmur RUSB, no rubs, or gallops appreciated. Abdomen: Soft, non-tender, non-distended with normoactive bowel sounds. No hepatomegaly. No rebound/guarding. No obvious abdominal masses. Msk:  Strength and tone appear normal for age. Extremities: No clubbing or cyanosis. No edema. Distal pedal pulses are 2+ and equal bilaterally. Neuro: Alert and oriented X 3. No facial asymmetry. No focal deficit. Moves all extremities spontaneously. Psych:  Responds to questions appropriately with a normal affect.   EKG:  The EKG was  personally reviewed and demonstrates: sinus tachycardia, 104 bpm, RBBB (noted on 08/29/2019, though appears to have been new at that time), prior inferior infarct pattern. Repeat EKG showed NSR, 87 bpm, RBBB, prior inferior infarct pattern  Telemetry:  Telemetry was personally reviewed and demonstrates: SR, 60s to 80s bpm, rare PVC  Weights: Filed Weights   09/04/19 1817 09/04/19 2258 09/05/19 0148  Weight: 96.6 kg 97 kg 96.7 kg    Relevant CV Studies: 2D echo 11/2016: - Left ventricle: The cavity size was normal. Wall thickness was   increased in a pattern of mild LVH. Systolic function was normal.   The estimated ejection fraction was in the range of 60% to 65%.   Wall motion was normal; there were no regional wall motion   abnormalities. Doppler parameters are consistent with abnormal   left ventricular relaxation (grade 1 diastolic dysfunction). - Mitral valve: Calcified annulus. - Left atrium: The atrium was mildly dilated. - Pericardium, extracardiac: A trivial pericardial effusion was   identified. __________  LHC 04/2015:  Mid RCA lesion, 100% stenosed.  Prox Cx lesion, 50% stenosed.  Prox LAD lesion, 60% stenosed.  Ost 1st Diag to 1st Diag lesion, 60% stenosed.  Mid Cx to Dist Cx lesion, 90% stenosed. There is a 0% residual stenosis post intervention.  A drug-eluting stent was placed.   1. Severe one-vessel coronary artery disease involving the mid left circumflex. Moderate proximal LAD stenosis not significant by FFR (0.83). Occluded small codominant mid RCA with left-to-right collaterals. 2. Normal LV systolic function by noninvasive testing. Mildly elevated left ventricular end-diastolic pressure. 3. Successful angioplasty and drug-eluting stent placement to the mid left circumflex.  Recommendations: Dual antiplatelets therapy for at least one year. Aggressive treatment of risk factors.   Laboratory Data:  Chemistry Recent Labs  Lab 09/02/19 1321 09/04/19  1825  NA 136 134*  K 4.2 3.5  CL 97 99  CO2 29 23  GLUCOSE 101* 140*  BUN 8 8  CREATININE 0.83 0.77  CALCIUM 10.0 9.4  GFRNONAA  --  >60  GFRAA  --  >60  ANIONGAP  --  12    Recent Labs  Lab 09/02/19 1321 09/04/19 1825  PROT 7.4 7.7  ALBUMIN 4.5 4.2  AST 23 31  ALT 20 22  ALKPHOS 128* 116  BILITOT 1.2 1.5*   Hematology Recent Labs  Lab 09/02/19 1321 09/04/19 1825 09/05/19 0340  WBC 11.1* 12.1* 9.5  RBC 4.42 4.39 3.99  HGB 12.3 12.0 10.9*  HCT 36.7 36.1 33.5*  MCV 83.0 82.2 84.0  MCH  --  27.3 27.3  MCHC 33.6 33.2 32.5  RDW 13.9 13.0 13.1  PLT 344.0 314 305   Cardiac EnzymesNo results for input(s): TROPONINI in the last 168 hours. No results for input(s): TROPIPOC in the last 168 hours.  BNPNo results for input(s): BNP, PROBNP in the last 168 hours.  DDimer No results for input(s): DDIMER in the last 168 hours.  Radiology/Studies:  Dg Chest 2 View  Result Date: 09/02/2019 IMPRESSION: No edema or consolidation. Electronically Signed   By: Lowella Grip III M.D.   On: 09/02/2019 19:55   Dg Chest Portable 1 View  Result Date: 09/04/2019 IMPRESSION: No active pulmonary disease. Electronically Signed   By: Zerita Boers M.D.   On: 09/04/2019 19:23    Assessment and Plan:   1. NSTEMI/CAD involving the native coronary vessels: -Continues to note some chest pain -Initial high sensitivity troponin of 978 with delta of 2720, trend this morning -ASA -Heparin gtt -Nitro gtt stopped secondary to soft BP -Morphine as needed -She did eat a small breakfast this morning consisting of yogurt and coffee around 8 to 830 AM -NPO -Schedule LHC with Dr. Rockey Situ later today -Risks and benefits of cardiac catheterization have been discussed with the patient including risks of bleeding, bruising, infection, kidney damage, stroke, heart attack, urgent need for bypass, injury to a limb, and death. The patient understands these risks and is willing to proceed with the  procedure. All questions have been answered and concerns listened to  2. Aortic sclerosis: -Check echo -Prior echo from 2018 showed no significant valvular abnormalities   3. HTN: -Blood pressure initially elevated in the [id number] systolic upon presentation, likely in the setting of her pain -Nitro gtt did lead to some hypotension, which has subsequently been discontinued -BP stable currently  -Continue Lopressor and Imdur -Look to add ARB if able post cath on rounds 10/16  4. HLD: -LDL of 66 this admission  -Continue Lipitor and Zetia   5. Leukocytosis: -No obvious sign of infection -Likely inflammatory in the setting of #1 -Resolved   6. Subclinical hypokalemia: -Replete to goal of 4.0  7. Obesity: -Weight loss advised  8. Tobacco abuse: -Cessation is advised    For questions or updates,  please contact [place] Please consult [email or web address] for contact info under Cardiology/STEMI.   Signed, Christell Faith, PA-C [place] Pager: [phone] 09/05/2019, 9:01 AM

## 2019-09-05 NOTE — Progress Notes (Signed)
[place] at [place] NAME: Andrea Webster    MR#:  [id number]  DATE OF BIRTH:  03/26/1956  SUBJECTIVE:  CHIEF COMPLAINT:   Chief Complaint  Patient presents with  . Chest Pain  Mild chest pain prior to cath  REVIEW OF SYSTEMS:    Review of Systems  Constitutional: Positive for malaise/fatigue. Negative for chills and fever.  HENT: Negative for sore throat.   Eyes: Negative for blurred vision, double vision and pain.  Respiratory: Negative for cough, hemoptysis, shortness of breath and wheezing.   Cardiovascular: Positive for chest pain. Negative for palpitations, orthopnea and leg swelling.  Gastrointestinal: Negative for abdominal pain, constipation, diarrhea, heartburn, nausea and vomiting.  Genitourinary: Negative for dysuria and hematuria.  Musculoskeletal: Negative for back pain and joint pain.  Skin: Negative for rash.  Neurological: Negative for sensory change, speech change, focal weakness and headaches.  Endo/Heme/Allergies: Does not bruise/bleed easily.  Psychiatric/Behavioral: Negative for depression. The patient is not nervous/anxious.     DRUG ALLERGIES:   Allergies  Allergen Reactions  . Adhesive [Tape] Other (See Comments)    Don't use flex/fabric bandaids  . Crestor [Rosuvastatin Calcium] Other (See Comments)    aches  . Effexor [Venlafaxine] Other (See Comments)    irritable  . Lipitor [Atorvastatin] Other (See Comments)    Intolerant  . Paroxetine Hcl   . Sertraline Hcl   . Wellbutrin [Bupropion Hcl]     VITALS:  Blood pressure 127/73, pulse 76, temperature 98.4 F (36.9 C), temperature source Oral, resp. rate 12, height 4\' 11"  (1.499 m), weight 96.7 kg, SpO2 91 %.  PHYSICAL EXAMINATION:   Physical Exam  GENERAL:  63 y.o.-year-old patient lying in the bed with no acute distress.  EYES: Pupils equal, round, reactive to light and accommodation. No scleral icterus. Extraocular muscles intact.  HEENT: Head  atraumatic, normocephalic. Oropharynx and nasopharynx clear.  NECK:  Supple, no jugular venous distention. No thyroid enlargement, no tenderness.  LUNGS: Normal breath sounds bilaterally, no wheezing, rales, rhonchi. No use of accessory muscles of respiration.  CARDIOVASCULAR: S1, S2 normal. No murmurs, rubs, or gallops.  ABDOMEN: Soft, nontender, nondistended. Bowel sounds present. No organomegaly or mass.  EXTREMITIES: No cyanosis, clubbing or edema b/l.    NEUROLOGIC: Cranial nerves II through XII are intact. No focal Motor or sensory deficits b/l.   PSYCHIATRIC: The patient is alert and oriented x 3.  SKIN: No obvious rash, lesion, or ulcer.   LABORATORY PANEL:   CBC Recent Labs  Lab 09/05/19 0340  WBC 9.5  HGB 10.9*  HCT 33.5*  PLT 305   ------------------------------------------------------------------------------------------------------------------ Chemistries  Recent Labs  Lab 09/04/19 1825  NA 134*  K 3.5  CL 99  CO2 23  GLUCOSE 140*  BUN 8  CREATININE 0.77  CALCIUM 9.4  AST 31  ALT 22  ALKPHOS 116  BILITOT 1.5*   ------------------------------------------------------------------------------------------------------------------  Cardiac Enzymes No results for input(s): TROPONINI in the last 168 hours. ------------------------------------------------------------------------------------------------------------------  RADIOLOGY:  Dg Chest Portable 1 View  Result Date: 09/04/2019 CLINICAL DATA:  Chest pain EXAM: PORTABLE CHEST 1 VIEW COMPARISON:  Chest radiograph dated 09/02/2019 FINDINGS: The heart size and mediastinal contours are within normal limits. Both lungs are clear. The visualized skeletal structures are unremarkable. IMPRESSION: No active pulmonary disease. Electronically Signed   By: Zerita Boers M.D.   On: 09/04/2019 19:23     ASSESSMENT AND PLAN:   Non-STEMI. S/p cath today and balloon angioplasty. Continue aspirin,  Lopressor and  Lipitor  Hypertension.  Continue home hypertension medication. Hyperlipidemia.  Continue home Lipitor. Morbid obesity.  Diet control and follow-up PCP. Tobacco abuse.  Smoking cessation was counseled on admission  All the records are reviewed and case discussed with Care Management/Social Worker Management plans discussed with the patient, family and they are in agreement.  CODE STATUS: FULL CODE  TOTAL TIME TAKING CARE OF THIS PATIENT: 35 minutes.   POSSIBLE D/C IN 1-2 DAYS, DEPENDING ON CLINICAL CONDITION.  [name] [name] M.D on 09/05/2019 at 4:57 PM  Between 7am to 6pm - Pager - [phone]  After 6pm go to [email or web address] - password [email or web address] [employer] [facility]  Office  [phone]  CC: Primary care physician; Tonia Ghent, MD  Note: This dictation was prepared with Dragon dictation along with smaller phrase technology. Any transcriptional errors that result from this process are unintentional.

## 2019-09-06 ENCOUNTER — Encounter: Payer: Self-pay | Admitting: Cardiovascular Disease

## 2019-09-06 DIAGNOSIS — I25118 Atherosclerotic heart disease of native coronary artery with other forms of angina pectoris: Secondary | ICD-10-CM

## 2019-09-06 DIAGNOSIS — E782 Mixed hyperlipidemia: Secondary | ICD-10-CM

## 2019-09-06 LAB — BASIC METABOLIC PANEL
Anion gap: 10 (ref 5–15)
BUN: 6 mg/dL — ABNORMAL LOW (ref 8–23)
CO2: 25 mmol/L (ref 22–32)
Calcium: 9 mg/dL (ref 8.9–10.3)
Chloride: 101 mmol/L (ref 98–111)
Creatinine, Ser: 0.87 mg/dL (ref 0.44–1.00)
GFR calc Af Amer: >60 mL/min (ref >60)
GFR calc non Af Amer: >60 mL/min (ref >60)
Glucose, Bld: 131 mg/dL — ABNORMAL HIGH (ref 70–99)
Potassium: 3.9 mmol/L (ref 3.5–5.1)
Sodium: 136 mmol/L (ref 135–145)

## 2019-09-06 LAB — CBC
HCT: 35.2 % — ABNORMAL LOW (ref 36.0–46.0)
Hemoglobin: 11.6 g/dL — ABNORMAL LOW (ref 12.0–15.0)
MCH: 27.5 pg (ref 26.0–34.0)
MCHC: 33 g/dL (ref 30.0–36.0)
MCV: 83.4 fL (ref 80.0–100.0)
Platelets: 312 10*3/uL (ref 150–400)
RBC: 4.22 MIL/uL (ref 3.87–5.11)
RDW: 13.2 % (ref 11.5–15.5)
WBC: 10.3 10*3/uL (ref 4.0–10.5)
nRBC: 0 % (ref 0.0–0.2)

## 2019-09-06 LAB — POCT ACTIVATED CLOTTING TIME: Activated Clotting Time: 378 seconds

## 2019-09-06 MED ORDER — CLOPIDOGREL BISULFATE 75 MG PO TABS: 75.0000 mg | ORAL_TABLET | Freq: Every day | ORAL | 0 refills | Status: DC

## 2019-09-06 NOTE — Care Management Important Message (Signed)
Important Message  Patient Details  Name: Andrea Webster MRN: [id number] Date of Birth: Jan 27, 1956   Medicare Important Message Given:  No  Patient lett prior to arrival to unit to deliver concurrent Medicare IM.     Chundra Sauerwein 09/06/2019, 1:25 PM

## 2019-09-06 NOTE — Progress Notes (Signed)
Progress Note  Patient Name: Andrea Webster Date of Encounter: 09/06/2019  Primary Cardiologist: Darlis Loan  Subjective   Husband at the bedside She reports no significant chest pain overnight, slept well No complaints, has been walking, no shortness of breath  Inpatient Medications    Scheduled Meds: . aspirin EC  81 mg Oral Daily  . atorvastatin  40 mg Oral q1800  . clopidogrel  75 mg Oral Q breakfast  . ezetimibe  10 mg Oral Daily  . fluticasone  1 spray Each Nare Daily  . isosorbide mononitrate  60 mg Oral Daily  . metoprolol tartrate  25 mg Oral BID  . pantoprazole  40 mg Oral BID  . sodium chloride flush  3 mL Intravenous Once  . sodium chloride flush  3 mL Intravenous Q12H   Continuous Infusions: . sodium chloride 75 mL/hr at 09/05/19 1243  . sodium chloride    . nitroGLYCERIN Stopped (09/05/19 0444)   PRN Meds: sodium chloride, acetaminophen, acetaminophen, ALPRAZolam, metoprolol tartrate, morphine injection, nitroGLYCERIN, ondansetron (ZOFRAN) IV, ondansetron (ZOFRAN) IV, oxyCODONE-acetaminophen, senna, sodium chloride flush, zolpidem   Vital Signs    Vitals:   09/05/19 2109 09/06/19 0138 09/06/19 0404 09/06/19 0715  BP: 130/64  118/69 117/78  Pulse: 73  72 70  Resp:   18 17  Temp:   98.7 F (37.1 C) 98 F (36.7 C)  TempSrc:   Oral Oral  SpO2:   93% 98%  Weight:  95.5 kg    Height:        Intake/Output Summary (Last 24 hours) at 09/06/2019 1210 Last data filed at 09/06/2019 0954 Gross per 24 hour  Intake 1791.01 ml  Output 1700 ml  Net 91.01 ml   Last 3 Weights 09/06/2019 09/05/2019 09/05/2019  Weight (lbs) 210 lb 8 oz 213 lb 3 oz 213 lb 3.2 oz  Weight (kg) 95.482 kg 96.7 kg 96.707 kg      Telemetry    Normal sinus rhythm- Personally Reviewed  ECG     - Personally Reviewed  Physical Exam   GEN: No acute distress.   Neck: No JVD Cardiac: RRR, no murmurs, rubs, or gallops.  Respiratory: Clear to auscultation bilaterally. GI:  Soft, nontender, non-distended  MS: No edema; No deformity. Neuro:  Nonfocal  Psych: Normal affect   Labs    High Sensitivity Troponin:   Recent Labs  Lab 09/04/19 1825 09/04/19 2030 09/05/19 0921 09/05/19 1753  TROPONINIHS 978* 2,720* 10,617* 16,315*      Chemistry Recent Labs  Lab 09/02/19 1321 09/04/19 1825 09/06/19 0517  NA 136 134* 136  K 4.2 3.5 3.9  CL 97 99 101  CO2 29 23 25   GLUCOSE 101* 140* 131*  BUN 8 8 6*  CREATININE 0.83 0.77 0.87  CALCIUM 10.0 9.4 9.0  PROT 7.4 7.7  --   ALBUMIN 4.5 4.2  --   AST 23 31  --   ALT 20 22  --   ALKPHOS 128* 116  --   BILITOT 1.2 1.5*  --   GFRNONAA  --  >60 >60  GFRAA  --  >60 >60  ANIONGAP  --  12 10     Hematology Recent Labs  Lab 09/04/19 1825 09/05/19 0340 09/06/19 0517  WBC 12.1* 9.5 10.3  RBC 4.39 3.99 4.22  HGB 12.0 10.9* 11.6*  HCT 36.1 33.5* 35.2*  MCV 82.2 84.0 83.4  MCH 27.3 27.3 27.5  MCHC 33.2 32.5 33.0  RDW 13.0 13.1 13.2  PLT 314 305 312    BNPNo results for input(s): BNP, PROBNP in the last 168 hours.   DDimer No results for input(s): DDIMER in the last 168 hours.   Radiology    Dg Chest Portable 1 View  Result Date: 09/04/2019 CLINICAL DATA:  Chest pain EXAM: PORTABLE CHEST 1 VIEW COMPARISON:  Chest radiograph dated 09/02/2019 FINDINGS: The heart size and mediastinal contours are within normal limits. Both lungs are clear. The visualized skeletal structures are unremarkable. IMPRESSION: No active pulmonary disease. Electronically Signed   By: Zerita Boers M.D.   On: 09/04/2019 19:23    Cardiac Studies  Echocardiogram  1. Left ventricular ejection fraction, by visual estimation, is 60 to 65%. The left ventricle has normal function. Normal left ventricular size. There is no left ventricular hypertrophy.  2. Left ventricular diastolic Doppler parameters are consistent with impaired relaxation pattern of LV diastolic filling.  3. Global right ventricle has normal systolic function.The  right ventricular size is normal. No increase in right ventricular wall thickness.  4. Left atrial size was mildly dilated.  5. Moderately elevated pulmonary artery systolic pressure. 45 mm Hg  6. Definity contrast agent was given IV to delineate the left ventricular endocardial borders.  Cardiac catheterization Occluded mid RCA, known from 2016 Severe stenosis of proximal left circumflex Moderate proximal LAD disease Moderate distal left circumflex disease, distal to the stent  Recommendations:  Case discussed with interventional cardiology We will plan for PCI of proximal left circumflex, likely culprit vessel Medical management of proximal LAD disease, distal left circumflex disease Strong recommendation for smoking cessation --1.  Vessel CAD with 90% stenosis proximal left circumflex 2.  Successful PCI with DES proximal left circumflex  STENT RESOLUTE ONYX 2.25X12.   Patient Profile     63 year old female with past medical history of coronary artery disease, prior MI stent to left circumflex 2016, smoker, hypertension, obesity hyperlipidemia who presents for chest discomfort/non-STEMI peak troponin 16,000  Assessment & Plan    Non-STEMI Known disease, prior catheterization 2016 with occluded mid RCA 100%, moderate proximal left circumflex and LAD disease, severe mid to distal circumflex disease 90%, stent placed at that time --Presenting now with severe chest pain symptoms, troponin up to 16,000 ----Catheterization yesterday showing severe disease of proximal left circumflex Successful PCI with DES proximal left circumflex STENT RESOLUTE ONYX 2.25X12. --- Continue aspirin Plavix, statin, beta-blocker, Zetia Okay to restart outpatient Imdur No room on blood pressure to add ACE inhibitor or ARB  Smoker Long discussion with her concerning smoking cessation, husband at the bedside We have encouraged her to continue to work on weaning her cigarettes and smoking cessation. She  will continue to work on this and does not want any assistance with chantix.  They are going to do it together  Hypertension Blood pressure markedly elevated on arrival in the setting of pain Blood pressure stabilized, would restart outpatient medications If blood pressure allows as an outpatient could add low-dose ACE inhibitor or ARB  Long discussion with patient and patient's husband concerning details from yesterday cardiac catheterization stent placement, discharge instructions discussed, including recovery from cardiac catheterization, smoking cessation techniques  Total encounter time more than 35 minutes  Greater than 50% was spent in counseling and coordination of care with the patient   For questions or updates, please contact [place] Please consult [email or web address] for contact info under        Signed, Ida Rogue, MD  09/06/2019, 12:10 PM

## 2019-09-06 NOTE — Progress Notes (Signed)
Discharged to home with her husband. Education print outs about femoral site care, and smoking cessation.  She will make an appointment to see Dr. Fletcher Anon next week.  Cardiac Rehab will contact her.

## 2019-09-09 ENCOUNTER — Telehealth: Payer: Self-pay

## 2019-09-09 NOTE — Telephone Encounter (Signed)
Left message for patient to call back to complete TCM and schedule appointment. 

## 2019-09-10 NOTE — Progress Notes (Deleted)
Cardiology Office Note    Date:  09/10/2019   ID:  Andrea Webster, DOB 07-May-1956, MRN [id number]  PCP:  Tonia Ghent, MD  Cardiologist:  Kathlyn Sacramento, MD  Electrophysiologist:  None   Chief Complaint: Hospital follow-up  History of Present Illness:   Andrea Webster is a 63 y.o. female with history of CAD s/p MI at age 31 with PTCA performed to an unknown vessel at that time, s/p PCI/DES to the LCx in 2016 with recent non-STEMI on 09/05/2019 s/p PCI/DES to LCx as detailed below, ongoing tobacco abuse (previously quit briefly in 01/2015), HTN, HLD, obesity, anxiety, strong family history of CAD, and prior cholecystectomy  Past Medical History:  Diagnosis Date  . Anxiety   . Arthritis   . Baker's cyst    x 2  . CAD (coronary artery disease)    a. s/p MI & PTCA;  b. Ex MV: basal inflat, mid inflat, apical inf, apical lat ischemia;  c. 04/2015 PCI: LM nl, LAD 60p (FFR 0.83), D1 60ost, LCX 50p, 38m (2.75x15 Xience [place] DES), OM1/2/3 min irregs, RCA 176m CTO.  . Depression    intolerant of paxil, wellbutrin, zoloft  . GERD (gastroesophageal reflux disease)   . Hayfever   . headaches   . Heart murmur   . High cholesterol   . Hypertension   . Insomnia   . Myocardial infarction ([place])   . RLS (restless legs syndrome)   . Sciatica   . Smoker    a. quit 01/2014.    Past Surgical History:  Procedure Laterality Date  . ANGIOPLASTY  1997  . APPENDECTOMY  1978   open  . [place]  . CARDIAC CATHETERIZATION N/A 04/30/2015   Procedure: Left Heart Cath;  Surgeon: Wellington Hampshire, MD;  Location: [facility];  Service: Cardiovascular;  Laterality: N/A;  . CARPAL TUNNEL RELEASE    . CESAREAN SECTION  1980  . CESAREAN SECTION  1984  . CHOLECYSTECTOMY N/A 03/30/2016   Procedure: LAPAROSCOPIC CHOLECYSTECTOMY WITH INTRAOPERATIVE CHOLANGIOGRAM;  Surgeon: Jules Husbands, MD;  Location: [facility];  Service: General;  Laterality:  N/A;  . [place]  [facility]  . CORONARY STENT INTERVENTION N/A 09/05/2019   Procedure: CORONARY STENT INTERVENTION;  Surgeon: Isaias Cowman, MD;  Location: [facility];  Service: Cardiovascular;  Laterality: N/A;  . HERNIA REPAIR  11/2016   [facility]; Dr. Gaynelle Arabian  . LEFT HEART CATH AND CORONARY ANGIOGRAPHY N/A 09/05/2019   Procedure: LEFT HEART CATH AND CORONARY ANGIOGRAPHY;  Surgeon: Minna Merritts, MD;  Location: [facility];  Service: Cardiovascular;  Laterality: N/A;  . VAGINAL HYSTERECTOMY      Current Medications: No outpatient medications have been marked as taking for the 09/13/19 encounter (Appointment) with Rise Mu, PA-C.    Allergies:   Adhesive [tape], Crestor [rosuvastatin calcium], Effexor [venlafaxine], Lipitor [atorvastatin], Paroxetine hcl, Sertraline hcl, and Wellbutrin [bupropion hcl]   Social History   Socioeconomic History  . Marital status: Widowed    Spouse name: Not on file  . Number of children: Not on file  . Years of education: Not on file  . Highest education level: Not on file  Occupational History  . Not on file  Social Needs  . Financial resource strain: Not  on file  . Food insecurity    Worry: Not on file    Inability: Not on file  . Transportation needs    Medical: Not on file    Non-medical: Not on file  Tobacco Use  . Smoking status: Former Smoker    Packs/day: 0.33    Years: 20.00    Pack years: 6.60    Types: Cigarettes    Quit date: 02/13/2015    Years since quitting: 4.5  . Smokeless tobacco: Never Used  Substance and Sexual Activity  . Alcohol use: Yes    Alcohol/week: 0.0 standard drinks    Comment: rare  . Drug use: No  . Sexual activity: Not on file  Lifestyle  . Physical activity    Days per week: Not on file    Minutes per session: Not on file  . Stress: Not on file   Relationships  . Social Herbalist on phone: Not on file    Gets together: Not on file    Attends religious service: Not on file    Active member of club or organization: Not on file    Attends meetings of clubs or organizations: Not on file    Relationship status: Not on file  Other Topics Concern  . Not on file  Social History Narrative   From [place], to [place] 2011   Widow as of 2001   1 son prev incarcerated, out as of 2019, infrequent contact as of 2019   1 daughter     Family History:  The patient's family history includes Alcohol abuse in her father; Breast cancer in her maternal grandmother; Diabetes in her mother; Heart attack in her father and mother; Heart disease in her father and mother; Hypertension in her mother. There is no history of Colon cancer, Bladder Cancer, or Kidney cancer.  ROS:   ROS   EKGs/Labs/Other Studies Reviewed:    Studies reviewed were summarized above. The additional studies were reviewed today:  2D echo 09/08/2019: 1. Left ventricular ejection fraction, by visual estimation, is 60 to 65%. The left ventricle has normal function. Normal left ventricular size. There is no left ventricular hypertrophy.  2. Left ventricular diastolic Doppler parameters are consistent with impaired relaxation pattern of LV diastolic filling.  3. Global right ventricle has normal systolic function.The right ventricular size is normal. No increase in right ventricular wall thickness.  4. Left atrial size was mildly dilated.  5. Moderately elevated pulmonary artery systolic pressure. 45 mm Hg  6. Definity contrast agent was given IV to delineate the left ventricular endocardial borders. __________  LHC 09/05/2019: Coronary dominance: Right/or codominant  Left mainstem:   Large vessel that bifurcates into the LAD and left circumflex, no significant disease noted  Left anterior descending (LAD):   Large vessel that extends to the apical region,  diagonal branch 2 of moderate size, proximal LAD with 50% stenosis, unchanged from 2016  Left circumflex (LCx):  Large vessel with OM branch 2, patent stent in the mid to distal vessel 90% proximal left circumflex disease prior to takeoff of the OM1, progression from 60% stenosis in 2016 Distal left circumflex disease estimated 50-60%, beyond the mid to distal stent  Right coronary artery (RCA): Moderate sized right coronary artery, occluded in the mid vessel, collaterals from left to right Known to be occluded from prior catheterization 2016  Left ventriculography: Left ventricular systolic function is normal, LVEF is estimated at 55%, there is no significant mitral regurgitation ,  no significant aortic valve stenosis  Final Conclusions:   Occluded mid RCA, known from 2016 Severe stenosis of proximal left circumflex Moderate proximal LAD disease Moderate distal left circumflex disease, distal to the stent  Recommendations:  Case discussed with interventional cardiology We will plan for PCI of proximal left circumflex, likely culprit vessel Medical management of proximal LAD disease, distal left circumflex disease Strong recommendation for smoking cessation __________  PCI 09/05/2019:  Prox Cx lesion is 90% stenosed.  Previously placed Mid Cx to Dist Cx stent (unknown type) is widely patent.  A drug-eluting stent was successfully placed using a [place] [id number].  Post intervention, there is a 0% residual stenosis.   1.  Vessel CAD with 90% stenosis proximal left circumflex 2.  Successful PCI with DES proximal left circumflex   EKG:  EKG is ordered today.  The EKG ordered today demonstrates ***  Recent Labs: 09/04/2019: ALT 22 09/06/2019: BUN 6; Creatinine, Ser 0.87; Hemoglobin 11.6; Platelets 312; Potassium 3.9; Sodium 136  Recent Lipid Panel    Component Value Date/Time   CHOL 124 09/05/2019 0340   CHOL 206 (H) 11/16/2016 1456   TRIG 130 09/05/2019 0340    HDL 32 (L) 09/05/2019 0340   HDL 54 11/16/2016 1456   CHOLHDL 3.9 09/05/2019 0340   VLDL 26 09/05/2019 0340   LDLCALC 66 09/05/2019 0340   LDLCALC 118 (H) 11/16/2016 1456    PHYSICAL EXAM:    VS:  There were no vitals taken for this visit.  BMI: There is no height or weight on file to calculate BMI.  Physical Exam  Wt Readings from Last 3 Encounters:  09/06/19 210 lb 8 oz (95.5 kg)  09/02/19 213 lb 9.6 oz (96.9 kg)  08/29/19 213 lb 12 oz (97 kg)     ASSESSMENT & PLAN:   1. ***  Disposition: F/u with Dr. Fletcher Anon or an APP in ***.   Medication Adjustments/Labs and Tests Ordered: Current medicines are reviewed at length with the patient today.  Concerns regarding medicines are outlined above. Medication changes, Labs and Tests ordered today are summarized above and listed in the Patient Instructions accessible in Encounters.   Signed, Christell Faith, PA-C 09/10/2019 9:22 AM     [facility] [place], [place] [address] [phone]

## 2019-09-10 NOTE — Telephone Encounter (Signed)
2nd attempt made today to reach patient. This time was not able to leave a message.

## 2019-09-11 ENCOUNTER — Other Ambulatory Visit: Payer: Medicare HMO

## 2019-09-11 NOTE — Telephone Encounter (Signed)
Spoke with patient and scheduled follow up for 09/19/2019. Not eligible for TCM now so this was not done. Patient does have appointment with Dr Fletcher Anon scheduled for 09/27/2019.

## 2019-09-11 NOTE — Telephone Encounter (Signed)
Noted. Thanks.

## 2019-09-13 ENCOUNTER — Ambulatory Visit: Payer: Medicare HMO | Admitting: Physician Assistant

## 2019-09-14 NOTE — Discharge Summary (Signed)
[facility] [place] NAME: Andrea Webster    MR#:  [id number]  DATE OF BIRTH:  15-Sep-1956  DATE OF ADMISSION:  09/04/2019 ADMITTING PHYSICIAN: Demetrios Loll, MD  DATE OF DISCHARGE: 09/06/2019 12:20 PM  PRIMARY CARE PHYSICIAN: Tonia Ghent, MD   ADMISSION DIAGNOSIS:  Elevated troponin I level [R77.8] Chest pain, unspecified type [R07.9]  DISCHARGE DIAGNOSIS:  Active Problems:   NSTEMI (non-ST elevated myocardial infarction) ([place])   SECONDARY DIAGNOSIS:   Past Medical History:  Diagnosis Date  . Anxiety   . Arthritis   . Baker's cyst    x 2  . CAD (coronary artery disease)    a. s/p MI & PTCA;  b. Ex MV: basal inflat, mid inflat, apical inf, apical lat ischemia;  c. 04/2015 PCI: LM nl, LAD 60p (FFR 0.83), D1 60ost, LCX 50p, 45m (2.75x15 Xience Alpine DES), OM1/2/3 min irregs, RCA 134m CTO.  . Depression    intolerant of paxil, wellbutrin, zoloft  . GERD (gastroesophageal reflux disease)   . Hayfever   . headaches   . Heart murmur   . High cholesterol   . Hypertension   . Insomnia   . Myocardial infarction ([place])   . RLS (restless legs syndrome)   . Sciatica   . Smoker    a. quit 01/2014.     ADMITTING HISTORY  HISTORY OF PRESENT ILLNESS:  Andrea Webster  is a 63 y.o. female with a known history of multiple medical problems as below.  The patient presents the ED with above chief complaints.  She denies any obvious chest pain, abdominal pain, nausea or diaphoresis.  She has a history of CAD and is scheduled for outpatient stress test.  Since her symptoms is getting worse, she came to ED for further evaluation.  She is found elevated troponin at 978.  Dr. Quentin Cornwall ordered heparin drip and request admission.   HOSPITAL COURSE:   Non-STEMI. S/p cath and balloon angioplasty. Continue aspirin, Lopressor and Lipitor On the day of discharge patient is chest pain-free.  No signs of fluid overload.  Ambulated without any problems and  requesting to be discharged home. Cardiac rehab referral  Discussed with cardiology prior to discharge.  ACE inhibitor or ARB not prescribed as blood pressure is borderline.  Can be prescribed as outpatient if blood pressure is normal.  Hypertension. Continue home hypertension medication. Hyperlipidemia. Continue home Lipitor. Morbid obesity. Diet control and follow-up PCP. Tobacco abuse. Smoking cessation was counseled on admission  Patient stable for discharge home.  CONSULTS OBTAINED:  Treatment Team:  Minna Merritts, MD  DRUG ALLERGIES:   Allergies  Allergen Reactions  . Adhesive [Tape] Other (See Comments)    Don't use flex/fabric bandaids  . Crestor [Rosuvastatin Calcium] Other (See Comments)    aches  . Effexor [Venlafaxine] Other (See Comments)    irritable  . Lipitor [Atorvastatin] Other (See Comments)    Intolerant  . Paroxetine Hcl   . Sertraline Hcl   . Wellbutrin [Bupropion Hcl]     DISCHARGE MEDICATIONS:   Allergies as of 09/06/2019      Reactions   Adhesive [tape] Other (See Comments)   Don't use flex/fabric bandaids   Crestor [rosuvastatin Calcium] Other (See Comments)   aches   Effexor [venlafaxine] Other (See Comments)   irritable   Lipitor [atorvastatin] Other (See Comments)   Intolerant   Paroxetine Hcl    Sertraline Hcl    Wellbutrin [bupropion Hcl]  Medication List    TAKE these medications   ALPRAZolam 1 MG tablet Commonly known as: XANAX Take 1 tablet (1 mg total) by mouth 3 (three) times daily as needed for anxiety.   aspirin 81 MG chewable tablet Chew 81 mg by mouth daily.   atorvastatin 20 MG tablet Commonly known as: LIPITOR Take 1 tablet (20 mg total) by mouth daily. Pt must keep appointment for additional refills, Thank you.   clopidogrel 75 MG tablet Commonly known as: PLAVIX Take 1 tablet (75 mg total) by mouth daily with breakfast.   ezetimibe 10 MG tablet Commonly known as: ZETIA Take 1 tablet (10 mg  total) by mouth daily.   fluticasone 50 MCG/ACT nasal spray Commonly known as: FLONASE USE 2 SPRAYS IN EACH NOSTRIL ONE TIME DAILY   isosorbide mononitrate 60 MG 24 hr tablet Commonly known as: IMDUR Take 1 tablet (60 mg total) by mouth daily.   metoprolol tartrate 25 MG tablet Commonly known as: LOPRESSOR TAKE 1 TABLET TWICE DAILY   multivitamin tablet Take 1 tablet by mouth daily.   nitroGLYCERIN 0.4 MG SL tablet Commonly known as: NITROSTAT PLACE 1 TABLET (0.4 MG TOTAL) UNDER THE TONGUE EVERY 5 (FIVE) MINUTES AS NEEDED FOR CHEST PAIN.   nystatin powder Commonly known as: MYCOSTATIN/NYSTOP Apply topically 3 (three) times daily.   pantoprazole 40 MG tablet Commonly known as: PROTONIX Take 1 tablet (40 mg total) by mouth 2 (two) times daily.   senna 8.6 MG tablet Commonly known as: SENOKOT Take 1 tablet by mouth as needed for constipation.   Ventolin HFA 108 (90 Base) MCG/ACT inhaler Generic drug: albuterol INHALE 2 PUFFS EVERY 6  HOURS AS NEEDED FOR WHEEZING OR SHORTNESS OF BREATH.   zolpidem 10 MG tablet Commonly known as: AMBIEN Take 1 tablet (10 mg total) by mouth at bedtime as needed. for sleep       Today   VITAL SIGNS:  Blood pressure 117/78, pulse 70, temperature 98 F (36.7 C), temperature source Oral, resp. rate 17, height 4\' 11"  (1.499 m), weight 95.5 kg, SpO2 98 %.  I/O:  No intake or output data in the 24 hours ending 09/14/19 1327  PHYSICAL EXAMINATION:  Physical Exam  GENERAL:  63 y.o.-year-old patient lying in the bed with no acute distress.  LUNGS: Normal breath sounds bilaterally, no wheezing, rales,rhonchi or crepitation. No use of accessory muscles of respiration.  CARDIOVASCULAR: S1, S2 normal. No murmurs, rubs, or gallops.  ABDOMEN: Soft, non-tender, non-distended. Bowel sounds present. No organomegaly or mass.  NEUROLOGIC: Moves all 4 extremities. PSYCHIATRIC: The patient is alert and oriented x 3.  SKIN: No obvious rash, lesion, or  ulcer.   DATA REVIEW:   CBC No results for input(s): WBC, HGB, HCT, PLT in the last 168 hours.  Chemistries  No results for input(s): NA, K, CL, CO2, GLUCOSE, BUN, CREATININE, CALCIUM, MG, AST, ALT, ALKPHOS, BILITOT in the last 168 hours.  Invalid input(s): GFRCGP  Cardiac Enzymes No results for input(s): TROPONINI in the last 168 hours.  Microbiology Results  Results for orders placed or performed during the hospital encounter of 09/04/19  SARS CORONAVIRUS 2 (TAT 6-24 HRS) Nasopharyngeal Nasopharyngeal Swab     Status: None   Collection Time: 09/04/19  8:05 PM   Specimen: Nasopharyngeal Swab  Result Value Ref Range Status   SARS Coronavirus 2 NEGATIVE NEGATIVE Final    Comment: (NOTE) SARS-CoV-2 target nucleic acids are NOT DETECTED. The SARS-CoV-2 RNA is generally detectable in upper and lower  respiratory specimens during the acute phase of infection. Negative results do not preclude SARS-CoV-2 infection, do not rule out co-infections with other pathogens, and should not be used as the sole basis for treatment or other patient management decisions. Negative results must be combined with clinical observations, patient history, and epidemiological information. The expected result is Negative. Fact Sheet for Patients: SugarRoll.be Fact Sheet for Healthcare Providers: https://www.woods-mathews.com/ This test is not yet approved or cleared by the [place] FDA and  has been authorized for detection and/or diagnosis of SARS-CoV-2 by FDA under an Emergency Use Authorization (EUA). This EUA will remain  in effect (meaning this test can be used) for the duration of the COVID-19 declaration under Section 56 4(b)(1) of the Act, 21 U.S.C. section 360bbb-3(b)(1), unless the authorization is terminated or revoked sooner. Performed at [facility], [place] [address] [place], [place] [address]     RADIOLOGY:  No results  found.  Follow up with PCP in 1 week.  Management plans discussed with the patient, family and they are in agreement.  CODE STATUS:  Code Status History    Date Active Date Inactive Code Status Order ID Comments User Context   09/04/2019 2257 09/06/2019 1701 Full Code [id number]  Demetrios Loll, MD Inpatient   04/30/2015 1016 05/01/2015 1402 Full Code [id number]  Wellington Hampshire, MD Inpatient   Advance Care Planning Activity      TOTAL TIME TAKING CARE OF THIS PATIENT ON DAY OF DISCHARGE: more than 30 minutes.   [name] Sher Shampine M.D on 09/14/2019 at 1:27 PM  Between 7am to 6pm - Pager - [phone]  After 6pm go to [email or web address] - password [email or web address] [email or web address]  Office  [phone]  CC: Primary care physician; Tonia Ghent, MD  Note: This dictation was prepared with Dragon dictation along with smaller phrase technology. Any transcriptional errors that result from this process are unintentional.

## 2019-09-17 ENCOUNTER — Encounter: Payer: Self-pay | Admitting: Family Medicine

## 2019-09-19 ENCOUNTER — Inpatient Hospital Stay: Payer: Medicare HMO | Admitting: Family Medicine

## 2019-09-24 ENCOUNTER — Other Ambulatory Visit: Payer: Self-pay | Admitting: *Deleted

## 2019-09-24 NOTE — Telephone Encounter (Signed)
Faxed refill request. Alprazolam Last office visit:   09/02/2019 Last Filled:   90 tablet 2 06/13/2019  Please advise.

## 2019-09-25 MED ORDER — ALPRAZOLAM 1 MG PO TABS: 1.0000 mg | ORAL_TABLET | Freq: Three times a day (TID) | ORAL | 2 refills | Status: DC | PRN

## 2019-09-25 NOTE — Telephone Encounter (Signed)
Sent. Thanks.   

## 2019-09-26 ENCOUNTER — Other Ambulatory Visit: Payer: Self-pay | Admitting: *Deleted

## 2019-09-27 ENCOUNTER — Ambulatory Visit: Payer: Medicare HMO | Admitting: Cardiovascular Disease

## 2019-10-25 ENCOUNTER — Other Ambulatory Visit: Payer: Self-pay | Admitting: *Deleted

## 2019-10-25 MED ORDER — CLOPIDOGREL BISULFATE 75 MG PO TABS: 75.0000 mg | ORAL_TABLET | Freq: Every day | ORAL | 1 refills | Status: DC

## 2019-10-25 MED ORDER — EZETIMIBE 10 MG PO TABS: 10.0000 mg | ORAL_TABLET | Freq: Every day | ORAL | 1 refills | Status: DC

## 2019-10-30 ENCOUNTER — Telehealth: Payer: Self-pay

## 2019-10-30 NOTE — Telephone Encounter (Signed)
Pt wants to know what her blood type is. I advised we do not have pts blood type; pt has previously given blood and advised to contact [facility] for info. Pt voiced understanding.

## 2019-10-31 ENCOUNTER — Encounter: Payer: Self-pay | Admitting: Family Medicine

## 2019-11-01 ENCOUNTER — Other Ambulatory Visit: Payer: Self-pay | Admitting: *Deleted

## 2019-11-01 MED ORDER — EZETIMIBE 10 MG PO TABS: 10.0000 mg | ORAL_TABLET | Freq: Every day | ORAL | 1 refills | Status: DC

## 2019-11-01 MED ORDER — CLOPIDOGREL BISULFATE 75 MG PO TABS: 75.0000 mg | ORAL_TABLET | Freq: Every day | ORAL | 1 refills | Status: DC

## 2019-11-14 ENCOUNTER — Other Ambulatory Visit: Payer: Self-pay | Admitting: Family Medicine

## 2019-11-19 ENCOUNTER — Other Ambulatory Visit: Payer: Self-pay | Admitting: Family Medicine

## 2019-11-28 ENCOUNTER — Other Ambulatory Visit: Payer: Self-pay | Admitting: Family Medicine

## 2019-12-03 ENCOUNTER — Telehealth (INDEPENDENT_AMBULATORY_CARE_PROVIDER_SITE_OTHER): Payer: Medicare HMO | Admitting: Cardiovascular Disease

## 2019-12-03 ENCOUNTER — Other Ambulatory Visit: Payer: Self-pay

## 2019-12-03 ENCOUNTER — Encounter: Payer: Self-pay | Admitting: Cardiovascular Disease

## 2019-12-03 ENCOUNTER — Telehealth: Payer: Self-pay | Admitting: Cardiovascular Disease

## 2019-12-03 VITALS — Ht 59.5 in | Wt 200.0 lb

## 2019-12-03 DIAGNOSIS — I2510 Atherosclerotic heart disease of native coronary artery without angina pectoris: Secondary | ICD-10-CM

## 2019-12-03 DIAGNOSIS — E785 Hyperlipidemia, unspecified: Secondary | ICD-10-CM

## 2019-12-03 DIAGNOSIS — I10 Essential (primary) hypertension: Secondary | ICD-10-CM

## 2019-12-03 MED ORDER — NITROGLYCERIN 0.4 MG SL SUBL: 0.4000 mg | SUBLINGUAL_TABLET | SUBLINGUAL | 3 refills | Status: DC | PRN

## 2019-12-03 NOTE — Patient Instructions (Signed)
Medication Instructions:  Your physician recommends that you continue on your current medications as directed. Please refer to the Current Medication list given to you today.  Nitro- glycerin has been refilled today and sent to your mail order pharmacy.  *If you need a refill on your cardiac medications before your next appointment, please call your pharmacy*  Lab Work: None ordered If you have labs (blood work) drawn today and your tests are completely normal, you will receive your results only by: Marland Kitchen MyChart Message (if you have MyChart) OR . A paper copy in the mail If you have any lab test that is abnormal or we need to change your treatment, we will call you to review the results.  Testing/Procedures: None ordered  Follow-Up: At [facility], you and your health needs are our priority.  As part of our continuing mission to provide you with exceptional heart care, we have created designated Provider Care Teams.  These Care Teams include your primary Cardiologist (physician) and Advanced Practice Providers (APPs -  Physician Assistants and Nurse Practitioners) who all work together to provide you with the care you need, when you need it.  Your next appointment:   3 month(s)  The format for your next appointment:   In Person  Provider:    You may see Kathlyn Sacramento, MD or one of the following Advanced Practice Providers on your designated Care Team:    Murray Hodgkins, NP  Christell Faith, PA-C  Marrianne Mood, PA-C   Other Instructions N/A

## 2019-12-03 NOTE — Progress Notes (Signed)
Virtual Visit via Telephone Note   This visit type was conducted due to national recommendations for restrictions regarding the COVID-19 Pandemic (e.g. social distancing) in an effort to limit this patient's exposure and mitigate transmission in our community.  Due to her co-morbid illnesses, this patient is at least at moderate risk for complications without adequate follow up.  This format is felt to be most appropriate for this patient at this time.  The patient did not have access to video technology/had technical difficulties with video requiring transitioning to audio format only (telephone).  All issues noted in this document were discussed and addressed.  No physical exam could be performed with this format.  Please refer to the patient's chart for her  consent to telehealth for [facility].   Date:  12/03/2019   ID:  Andrea Webster, DOB 1956/04/20, MRN [id number]  Patient Location: Home Provider Location: Office  PCP:  Tonia Ghent, MD  Cardiologist:  Kathlyn Sacramento, MD  Electrophysiologist:  None   Evaluation Performed:  Follow-Up Visit  Chief Complaint: Doing well with no complaints.  History of Present Illness:    Andrea Webster is a 64 y.o. female was reached today via phone for a follow-up visit regarding coronary artery disease. She has known history of CAD s/p MI at the age of 53, with PTCA performed to unknown vessel.She has a strong family history for CAD and also smoked until March 2016. Previous cardiac catheterization in June 2016 showed a chronic total occlusion of the RCA, severe mid LCx disease with a 90% stenosis, and moderate proximal LAD disease.Pressure wire analysis was performed within the LAD stenosis and her fractional flow reserve was found to be not significant at 0.83, thus no PCI was performed within the LAD.The LCx was felt to be the culprit vessel and this was successfully stented using a 2.75 x 15 mm Xience Alpine DES. She has chronic  medical conditions including GERD, previous cholecystectomy, hyperlipidemia, essential hypertension and obesity.  She was seen in October for worsening anginal symptoms.  She was hospitalized briefly and underwent an echocardiogram which showed an EF of 60 to 65% with grade 1 diastolic dysfunction and mild to moderate pulmonary hypertension with peak systolic pressure 45 mmHg. Cardiac catheterization was done which showed chronically occluded mid RCA, moderate proximal LAD disease, patent left circumflex stent with severe proximal stenosis and moderate distal disease.  She underwent successful PCI and drug-eluting stent placement to the proximal left circumflex. She has been doing very well since then with resolution of anginal symptoms.  She takes her medications regularly.    The patient does not have symptoms concerning for COVID-19 infection (fever, chills, cough, or new shortness of breath).    Past Medical History:  Diagnosis Date  . Anxiety   . Arthritis   . Baker's cyst    x 2  . CAD (coronary artery disease)    a. s/p MI & PTCA;  b. Ex MV: basal inflat, mid inflat, apical inf, apical lat ischemia;  c. 04/2015 PCI: LM nl, LAD 60p (FFR 0.83), D1 60ost, LCX 50p, 59m (2.75x15 Xience Alpine DES), OM1/2/3 min irregs, RCA 126m CTO.  . Depression    intolerant of paxil, wellbutrin, zoloft  . GERD (gastroesophageal reflux disease)   . Hayfever   . headaches   . Heart murmur   . High cholesterol   . Hypertension   . Insomnia   . Myocardial infarction ([place])   . RLS (restless legs syndrome)   .  Sciatica   . Smoker    a. quit 01/2014.   Past Surgical History:  Procedure Laterality Date  . ANGIOPLASTY  1997  . APPENDECTOMY  1978   open  . [facility]  . CARDIAC CATHETERIZATION N/A 04/30/2015   Procedure: Left Heart Cath;  Surgeon: Wellington Hampshire, MD;  Location: [facility];  Service: Cardiovascular;  Laterality: N/A;  . CARPAL  TUNNEL RELEASE    . CESAREAN SECTION  1980  . CESAREAN SECTION  1984  . CHOLECYSTECTOMY N/A 03/30/2016   Procedure: LAPAROSCOPIC CHOLECYSTECTOMY WITH INTRAOPERATIVE CHOLANGIOGRAM;  Surgeon: Jules Husbands, MD;  Location: [facility];  Service: General;  Laterality: N/A;  . [place]  [facility]  . CORONARY STENT INTERVENTION N/A 09/05/2019   Procedure: CORONARY STENT INTERVENTION;  Surgeon: Isaias Cowman, MD;  Location: [place] [facility];  Service: Cardiovascular;  Laterality: N/A;  . HERNIA REPAIR  11/2016   [facility]; Dr. Gaynelle Arabian  . LEFT HEART CATH AND CORONARY ANGIOGRAPHY N/A 09/05/2019   Procedure: LEFT HEART CATH AND CORONARY ANGIOGRAPHY;  Surgeon: Minna Merritts, MD;  Location: [place] [facility];  Service: Cardiovascular;  Laterality: N/A;  . VAGINAL HYSTERECTOMY       Current Meds  Medication Sig  . ALPRAZolam (XANAX) 1 MG tablet Take 1 tablet (1 mg total) by mouth 3 (three) times daily as needed for anxiety.  Marland Kitchen aspirin 81 MG chewable tablet Chew 81 mg by mouth daily.  Marland Kitchen atorvastatin (LIPITOR) 20 MG tablet TAKE 1 TABLET EVERY DAY   . clopidogrel (PLAVIX) 75 MG tablet Take 1 tablet (75 mg total) by mouth daily with breakfast.  . ezetimibe (ZETIA) 10 MG tablet Take 1 tablet (10 mg total) by mouth daily.  . fluticasone (FLONASE) 50 MCG/ACT nasal spray USE 2 SPRAYS IN EACH NOSTRIL ONE TIME DAILY  . isosorbide mononitrate (IMDUR) 60 MG 24 hr tablet TAKE 1 TABLET EVERY DAY  . metoprolol tartrate (LOPRESSOR) 25 MG tablet TAKE 1 TABLET TWICE DAILY  . Multiple Vitamin (MULTIVITAMIN) tablet Take 1 tablet by mouth daily.    . nitroGLYCERIN (NITROSTAT) 0.4 MG SL tablet PLACE 1 TABLET (0.4 MG TOTAL) UNDER THE TONGUE EVERY 5 (FIVE) MINUTES AS NEEDED FOR CHEST PAIN.  Marland Kitchen nystatin (MYCOSTATIN/NYSTOP) powder Apply topically 3 (three) times daily. (Patient taking differently:  Apply topically as needed. )  . pantoprazole (PROTONIX) 40 MG tablet TAKE 1 TABLET EVERY DAY  . senna (SENOKOT) 8.6 MG tablet Take 1 tablet by mouth as needed for constipation.   . VENTOLIN HFA 108 (90 Base) MCG/ACT inhaler INHALE 2 PUFFS EVERY 6  HOURS AS NEEDED FOR WHEEZING OR SHORTNESS OF BREATH.  Marland Kitchen zolpidem (AMBIEN) 10 MG tablet Take 1 tablet (10 mg total) by mouth at bedtime as needed. for sleep     Allergies:   Adhesive [tape], Crestor [rosuvastatin calcium], Effexor [venlafaxine], Lipitor [atorvastatin], Paroxetine hcl, Sertraline hcl, and Wellbutrin [bupropion hcl]   Social History   Tobacco Use  . Smoking status: Former Smoker    Packs/day: 0.33    Years: 20.00    Pack years: 6.60    Types: Cigarettes    Quit date: 02/13/2015    Years since quitting: 4.8  . Smokeless tobacco: Never Used  Substance Use Topics  . Alcohol use: Yes  Alcohol/week: 0.0 standard drinks    Comment: rare  . Drug use: No     Family Hx: The patient's family history includes Alcohol abuse in her father; Breast cancer in her maternal grandmother; Diabetes in her mother; Heart attack in her father and mother; Heart disease in her father and mother; Hypertension in her mother. There is no history of Colon cancer, Bladder Cancer, or Kidney cancer.  ROS:   Please see the history of present illness.     All other systems reviewed and are negative.   Prior CV studies:   The following studies were reviewed today:  Reviewed most recent echocardiogram and catheterization with her.  Labs/Other Tests and Data Reviewed:    EKG:  No ECG reviewed.  Recent Labs: 09/04/2019: ALT 22 09/06/2019: BUN 6; Creatinine, Ser 0.87; Hemoglobin 11.6; Platelets 312; Potassium 3.9; Sodium 136   Recent Lipid Panel Lab Results  Component Value Date/Time   CHOL 124 09/05/2019 03:40 AM   CHOL 206 (H) 11/16/2016 02:56 PM   TRIG 130 09/05/2019 03:40 AM   HDL 32 (L) 09/05/2019 03:40 AM   HDL 54 11/16/2016 02:56 PM    CHOLHDL 3.9 09/05/2019 03:40 AM   LDLCALC 66 09/05/2019 03:40 AM   LDLCALC 118 (H) 11/16/2016 02:56 PM    Wt Readings from Last 3 Encounters:  12/03/19 200 lb (90.7 kg)  09/06/19 210 lb 8 oz (95.5 kg)  09/02/19 213 lb 9.6 oz (96.9 kg)     Objective:    Vital Signs:  Ht 4' 11.5" (1.511 m)   Wt 200 lb (90.7 kg)   BMI 39.72 kg/m    VITAL SIGNS:  reviewed  ASSESSMENT & PLAN:    1.  Coronary artery disease involving native coronary arteries without angina: She is doing well post stent placement to proximal left circumflex.  She has chronically occluded RCA with collaterals and moderate LAD disease.  She requires aggressive treatment of risk factors.  Continue dual antiplatelet therapy at least until October 2021 but we should consider long-term treatment.   2. Hyperlipidemia: She did not tolerate rosuvastatin in the past.  High-dose atorvastatin was associated with abnormal liver function test.  She is currently on 20 mg daily.  Zetia was most recently added a lipid profile showed an LDL of 66.    3.  Essential hypertension: Blood pressure is controlled on current medications.  4. Aortic sclerosis murmur: Echocardiogram in October showed no significant aortic stenosis.   COVID-19 Education: The signs and symptoms of COVID-19 were discussed with the patient and how to seek care for testing (follow up with PCP or arrange E-visit).  The importance of social distancing was discussed today.  Time:   Today, I have spent 10 minutes with the patient with telehealth technology discussing the above problems.     Medication Adjustments/Labs and Tests Ordered: Current medicines are reviewed at length with the patient today.  Concerns regarding medicines are outlined above.   Tests Ordered: No orders of the defined types were placed in this encounter.   Medication Changes: No orders of the defined types were placed in this encounter.   Follow Up:  In Person in 3  month(s)  Signed, Kathlyn Sacramento, MD  12/03/2019 4:25 PM    [facility]

## 2019-12-03 NOTE — Telephone Encounter (Signed)
Note pending at checkout.  Follow up needed

## 2019-12-10 ENCOUNTER — Other Ambulatory Visit: Payer: Self-pay | Admitting: Family Medicine

## 2019-12-11 DIAGNOSIS — H524 Presbyopia: Secondary | ICD-10-CM | POA: Diagnosis not present

## 2019-12-11 DIAGNOSIS — H2513 Age-related nuclear cataract, bilateral: Secondary | ICD-10-CM | POA: Diagnosis not present

## 2019-12-11 DIAGNOSIS — Z0100 Encounter for examination of eyes and vision without abnormal findings: Secondary | ICD-10-CM | POA: Diagnosis not present

## 2020-01-01 NOTE — Telephone Encounter (Signed)
Needs 3 m fu

## 2020-01-21 ENCOUNTER — Other Ambulatory Visit: Payer: Self-pay | Admitting: Family Medicine

## 2020-01-21 ENCOUNTER — Other Ambulatory Visit: Payer: Self-pay | Admitting: *Deleted

## 2020-01-21 NOTE — Telephone Encounter (Signed)
Faxed refill request. Zolpidem Last office visit:   09/02/2019 Last Filled:    90 tablet 1 08/07/2019   Please advise.

## 2020-01-22 ENCOUNTER — Other Ambulatory Visit: Payer: Self-pay

## 2020-01-22 MED ORDER — ALPRAZOLAM 1 MG PO TABS: 1.0000 mg | ORAL_TABLET | Freq: Three times a day (TID) | ORAL | 2 refills | Status: DC | PRN

## 2020-01-22 MED ORDER — ZOLPIDEM TARTRATE 10 MG PO TABS: 10.0000 mg | ORAL_TABLET | Freq: Every evening | ORAL | 1 refills | Status: DC | PRN

## 2020-01-22 NOTE — Telephone Encounter (Signed)
Zolpidem needs to be sent to [place]. I called [facility] and cancelled this RX with them. I pulled down the medication for review. Please refill. Thank you

## 2020-01-22 NOTE — Telephone Encounter (Signed)
Sent. Thanks.   

## 2020-01-22 NOTE — Telephone Encounter (Signed)
[facility] mail order pharmacy called requesting refill on Alprazolam. Last refilled on 09/25/2019 #90 with 2 refill. LOV 09/02/2019 for acute visit.  No future appointments scheduled.

## 2020-01-22 NOTE — Addendum Note (Signed)
Addended by: Kris Mouton on: 01/22/2020 11:39 AM   Modules accepted: Orders

## 2020-01-22 NOTE — Addendum Note (Signed)
Addended by: Tonia Ghent on: 01/22/2020 07:59 PM   Modules accepted: Orders

## 2020-01-27 NOTE — Telephone Encounter (Signed)
Attempted to schedule.  LMOV to call office.  ° °

## 2020-01-31 NOTE — Telephone Encounter (Signed)
Attempted to schedule no ans no vm ? ? ?3 attempts to schedule fu appt from recall list.   Deleting recall.  ? ?

## 2020-02-06 ENCOUNTER — Other Ambulatory Visit: Payer: Self-pay | Admitting: Family Medicine

## 2020-03-02 ENCOUNTER — Other Ambulatory Visit: Payer: Self-pay | Admitting: Family Medicine

## 2020-03-02 DIAGNOSIS — Z1231 Encounter for screening mammogram for malignant neoplasm of breast: Secondary | ICD-10-CM

## 2020-03-21 DIAGNOSIS — K621 Rectal polyp: Secondary | ICD-10-CM

## 2020-03-21 HISTORY — DX: Rectal polyp: K62.1

## 2020-03-26 DIAGNOSIS — K432 Incisional hernia without obstruction or gangrene: Secondary | ICD-10-CM | POA: Diagnosis not present

## 2020-03-30 ENCOUNTER — Other Ambulatory Visit: Payer: Self-pay | Admitting: General Surgery

## 2020-03-30 DIAGNOSIS — K439 Ventral hernia without obstruction or gangrene: Secondary | ICD-10-CM

## 2020-04-03 ENCOUNTER — Encounter: Payer: Self-pay | Admitting: Family Medicine

## 2020-04-03 ENCOUNTER — Other Ambulatory Visit: Payer: Self-pay

## 2020-04-03 ENCOUNTER — Ambulatory Visit (INDEPENDENT_AMBULATORY_CARE_PROVIDER_SITE_OTHER): Payer: Medicare HMO | Admitting: Family Medicine

## 2020-04-03 VITALS — BP 122/60 | HR 76 | Temp 97.4°F | Ht 59.5 in | Wt 199.4 lb

## 2020-04-03 DIAGNOSIS — M722 Plantar fascial fibromatosis: Secondary | ICD-10-CM | POA: Diagnosis not present

## 2020-04-03 MED ORDER — ALBUTEROL SULFATE HFA 108 (90 BASE) MCG/ACT IN AERS: INHALATION_SPRAY | RESPIRATORY_TRACT | 1 refills | Status: DC

## 2020-04-03 NOTE — Progress Notes (Signed)
This visit occurred during the SARS-CoV-2 public health emergency.  Safety protocols were in place, including screening questions prior to the visit, additional usage of staff PPE, and extensive cleaning of exam room while observing appropriate contact time as indicated for disinfecting solutions.  Foot pain.  About once every 2 months she'll start having pain in the night.  The next day she has sig pain and "I've got to learn to walk."  No pain today.  Episodes last about 1 week and then resolved until it flares again.  L foot sx.  L heel, plantar side.  She tried CBD cream, that helped.    She has used ventolin prn and needed refill.  rx sent.  Overall rare SABA use.    She has intentional weight loss noted in the last few years.    Meds, vitals, and allergies reviewed.   ROS: Per HPI unless specifically indicated in ROS section   nad ncat Able to bear weight. L heel ttp c/w plantar fasciitis, on the plantar side of the heel.  Achilles and medial/lateral malleolar not tender.  Midfoot nontender.  Intact dorsalis pedis pulse.  Distally neurovascular intact.

## 2020-04-03 NOTE — Patient Instructions (Signed)
Likely plantar fasciitis.   Try using full length soft arch support inserts.   Use the stretches and ice. Especially stretch prior to getting out of bed.   If not better, ask about seeing Dr. Lorelei Pont.  Take care.  Glad to see you.

## 2020-04-07 DIAGNOSIS — M722 Plantar fascial fibromatosis: Secondary | ICD-10-CM | POA: Insufficient documentation

## 2020-04-07 NOTE — Assessment & Plan Note (Signed)
Arch support, home exercise/stretching, icing discussed with patient.  Anatomy discussed with patient.  Handout given and explained.  If not improved with that then I want her to follow-up with Dr. Lorelei Pont.  She agrees.

## 2020-04-08 ENCOUNTER — Other Ambulatory Visit: Payer: Self-pay

## 2020-04-08 ENCOUNTER — Ambulatory Visit
Admission: RE | Admit: 2020-04-08 | Discharge: 2020-04-08 | Disposition: A | Discharge status: Home / Self Care | Payer: Medicare HMO | Source: Ambulatory Visit | Attending: General Surgery | Admitting: General Surgery

## 2020-04-08 DIAGNOSIS — K439 Ventral hernia without obstruction or gangrene: Secondary | ICD-10-CM | POA: Diagnosis not present

## 2020-04-08 LAB — POCT I-STAT CREATININE: Creatinine, Ser: 0.9 mg/dL (ref 0.44–1.00)

## 2020-04-08 MED ORDER — IOHEXOL 300 MG/ML  SOLN
100.0000 mL | Freq: Once | INTRAMUSCULAR | Status: AC | PRN
  Administered 2020-04-08: 100 mL via INTRAVENOUS

## 2020-04-16 ENCOUNTER — Other Ambulatory Visit: Payer: Self-pay | Admitting: Family Medicine

## 2020-04-16 DIAGNOSIS — K621 Rectal polyp: Secondary | ICD-10-CM | POA: Diagnosis not present

## 2020-04-19 ENCOUNTER — Other Ambulatory Visit: Payer: Self-pay | Admitting: General Surgery

## 2020-04-23 ENCOUNTER — Other Ambulatory Visit: Payer: Self-pay

## 2020-04-23 ENCOUNTER — Encounter
Admission: RE | Admit: 2020-04-23 | Discharge: 2020-04-23 | Disposition: A | Discharge status: Home / Self Care | Payer: Medicare HMO | Source: Ambulatory Visit | Attending: General Surgery | Admitting: General Surgery

## 2020-04-23 DIAGNOSIS — Z01818 Encounter for other preprocedural examination: Secondary | ICD-10-CM | POA: Insufficient documentation

## 2020-04-23 HISTORY — DX: Other complications of anesthesia, initial encounter: T88.59XA

## 2020-04-23 HISTORY — DX: Constipation, unspecified: K59.00

## 2020-04-23 HISTORY — DX: Sleep apnea, unspecified: G47.30

## 2020-04-23 HISTORY — DX: Plantar fascial fibromatosis: M72.2

## 2020-04-23 NOTE — Patient Instructions (Addendum)
INSTRUCTIONS FOR SURGERY     Your surgery is scheduled for:   Wednesday, June 9TH     To find out your arrival time for the day of surgery,          please call [phone] between 1 pm and 3 pm on :  Tuesday, June 8TH     When you arrive for surgery, report to the [facility].       Do NOT stop on the first floor to register.    REMEMBER: Instructions that are not followed completely may result in serious medical risk,  up to and including death, or upon the discretion of your surgeon and anesthesiologist,            your surgery may need to be rescheduled.  __X__ 1. Do not eat food after midnight the night before your procedure.                    No gum, candy, lozenger, tic tacs, tums or hard candies.                  ABSOLUTELY NOTHING SOLID IN YOUR MOUTH AFTER MIDNIGHT                    You may drink unlimited clear liquids up to 2 hours before you are scheduled to arrive for surgery.                   Do not drink anything within those 2 hours unless you need to take medicine, then take the                   smallest amount you need.  Clear liquids include:  water, apple juice without pulp,                   any flavor Gatorade, Black coffee, black tea.  Sugar may be added but no dairy/ honey /lemon.                        Broth and jello is not considered a clear liquid.  __x__  2. On the morning of surgery, please brush your teeth with toothpaste and water. You may rinse with                  mouthwash if you wish but DO NOT SWALLOW TOOTHPASTE OR MOUTHWASH  __X___3. NO alcohol for 24 hours before or after surgery.  __x___ 4.  Do NOT smoke or use e-cigarettes for 24 HOURS PRIOR TO SURGERY.                      DO NOT Use any chewable tobacco products for at least 6 hours prior to surgery.  __x___ 5. If you start any new medication after this appointment and prior to surgery, please   Bring it with you on the day of surgery.  ___x__ 6. Notify your doctor if there is any change in your medical condition, such as fever,  infection, vomitting,diarrhea or any open sores.  __x___ 7.  USE the CHG SOAP as instructed, the night before surgery and the day of surgery.                   Once you have washed with this soap, do NOT use any of the following: Powders, perfumes                    or lotions. Please do not wear make up, hairpins, clips or nail polish. You may wear deodorant.                   Men may shave their face and neck.  Women need to shave 48 hours prior to surgery.                   DO NOT wear ANY jewelry on the day of surgery. If there are rings that are too tight to                    remove easily, please address this prior to the surgery day. Piercings need to be removed.                                                                     NO METAL ON YOUR BODY.                    Do NOT bring any valuables.  If you came to Pre-Admit testing then you will not need license,                     insurance card or credit card.  If you will be staying overnight, please either leave your things in                     the car or have your family be responsible for these items.                     [facility] IS NOT RESPONSIBLE FOR BELONGINGS OR VALUABLES.  ___X__ 8. DO NOT wear contact lenses on surgery day.  You may not have dentures,                     Hearing aides, contacts or glasses in the operating room. These items can be                    Placed in the Recovery Room to receive immediately after surgery.  __x___ 9. IF YOU ARE SCHEDULED TO GO HOME ON THE SAME DAY, YOU MUST                   Have someone to drive you home and to stay with you  for the first 24 hours.                    Have an arrangement prior to arriving on surgery day.  ___x__ 10. Take the following medications on the morning of surgery with a sip of water:  1.FLONASE                     2.ISOSORBIDE                     3.METOPROLOL                     4.PROTONIX                     5.XANAX, if needed                     6.ZYRTEC                     7.ZETIA                     8.ALBUTEROL,  If needed                     9.ALLERGY EYE DROPS  _____ 11.  Follow any instructions provided to you by your surgeon.                        Such as enema, clear liquid bowel prep  __X__  12. STOP   PLAVIX  As of Friday, June 4th                     Stop all Aspirin products as of now .Marland Kitchen June 3rd                        THIS INCLUDES BC POWDERS / GOODIES POWDER                      CONTINUE ASPIRIN UP UNTIL THE DAY PRIOR TO SURGERY  __x___ 13. STOP Anti-inflammatories as of: NOW, June 3RD                      This includes IBUPROFEN / MOTRIN / ADVIL / ALEVE/ NAPROXYN                    YOU MAY TAKE TYLENOL ANY TIME PRIOR TO SURGERY.  _X____ 14.  You may continue taking Vitamin D3 but do not take on the morning of surgery.  ___X___17.  Continue to take the following medications but do not take on the morning of surgery:                          ASPIRIN // VITAMIN D3 // FIBER POWDER // SENNEKOT  __X____18.    Wear clean and comfortable clothing to the hospital.                      Wear something loose around your abdomen.  If you complete the Medical Advance Directive and get the document notarized, please bring   With you to the hospital and we will make a copy for your chart. Have phone numbers for contacts.

## 2020-04-24 ENCOUNTER — Ambulatory Visit (INDEPENDENT_AMBULATORY_CARE_PROVIDER_SITE_OTHER): Payer: Medicare HMO | Admitting: Nurse Practitioner

## 2020-04-24 ENCOUNTER — Encounter: Payer: Self-pay | Admitting: Nurse Practitioner

## 2020-04-24 VITALS — BP 120/60 | HR 60 | Ht 60.0 in | Wt 202.0 lb

## 2020-04-24 DIAGNOSIS — I2510 Atherosclerotic heart disease of native coronary artery without angina pectoris: Secondary | ICD-10-CM | POA: Diagnosis not present

## 2020-04-24 DIAGNOSIS — E785 Hyperlipidemia, unspecified: Secondary | ICD-10-CM | POA: Diagnosis not present

## 2020-04-24 DIAGNOSIS — I10 Essential (primary) hypertension: Secondary | ICD-10-CM | POA: Diagnosis not present

## 2020-04-24 DIAGNOSIS — Z01810 Encounter for preprocedural cardiovascular examination: Secondary | ICD-10-CM | POA: Diagnosis not present

## 2020-04-24 NOTE — Patient Instructions (Signed)
Medication Instructions:  Your physician recommends that you continue on your current medications as directed. Please refer to the Current Medication list given to you today.  *If you need a refill on your cardiac medications before your next appointment, please call your pharmacy*   Lab Work: None ordered If you have labs (blood work) drawn today and your tests are completely normal, you will receive your results only by: . MyChart Message (if you have MyChart) OR . A paper copy in the mail If you have any lab test that is abnormal or we need to change your treatment, we will call you to review the results.   Testing/Procedures: None ordered    Follow-Up: At [facility], you and your health needs are our priority.  As part of our continuing mission to provide you with exceptional heart care, we have created designated Provider Care Teams.  These Care Teams include your primary Cardiologist (physician) and Advanced Practice Providers (APPs -  Physician Assistants and Nurse Practitioners) who all work together to provide you with the care you need, when you need it.  We recommend signing up for the patient portal called "MyChart".  Sign up information is provided on this After Visit Summary.  MyChart is used to connect with patients for Virtual Visits (Telemedicine).  Patients are able to view lab/test results, encounter notes, upcoming appointments, etc.  Non-urgent messages can be sent to your provider as well.   To learn more about what you can do with MyChart, go to [email or web address].    Your next appointment:   6 month(s)  The format for your next appointment:   In Person  Provider:    You may see Muhammad Arida, MD or one of the following Advanced Practice Providers on your designated Care Team:    Christopher Berge, NP  Ryan Dunn, PA-C  Jacquelyn Visser, PA-C    

## 2020-04-24 NOTE — Progress Notes (Signed)
Office Visit    Patient Name: Andrea Webster Date of Encounter: 04/24/2020  Primary Care Provider:  Tonia Ghent, MD Primary Cardiologist:  Kathlyn Sacramento, MD  Chief Complaint    64 year old female with a history of CAD status post MI at age 72, hypertension, hyperlipidemia, obesity, GERD, sleep apnea, and remote tobacco abuse, presents for follow-up related to CAD and pending hernia repair.  Past Medical History    Past Medical History:  Diagnosis Date  . Anxiety   . Arthritis   . Baker's cyst    x 2  . CAD (coronary artery disease)    a. s/p MI & PTCA;  b. Ex MV: basal inflat, mid inflat, apical inf, apical lat ischemia;  c. 04/2015 PCI: LM nl, LAD 60p (FFR 0.83), D1 60ost, LCX 50p, 81m (2.75x15 Xience Alpine DES), OM1/2/3 min irregs, RCA 17m CTO.  Marland Kitchen Complication of anesthesia   . Constipation    pretty severe, makes hernia discomfort much worse.  . Depression    intolerant of paxil, wellbutrin, zoloft  . GERD (gastroesophageal reflux disease)   . Hayfever   . headaches   . Heart murmur   . High cholesterol   . Hypertension   . Insomnia   . Myocardial infarction ([place]) 08/2019   stented. first MI at age 66  . Plantar fasciitis   . Rectal polyp 03/2020  . RLS (restless legs syndrome)   . Sciatica   . Sleep apnea    snores very loudly  . Smoker    a. quit 01/2014.   Past Surgical History:  Procedure Laterality Date  . ANGIOPLASTY  1997  . APPENDECTOMY  1978   open  . [place]  . CARDIAC CATHETERIZATION N/A 04/30/2015   Procedure: Left Heart Cath;  Surgeon: Wellington Hampshire, MD;  Location: [facility];  Service: Cardiovascular;  Laterality: N/A;  . CARPAL TUNNEL RELEASE    . CESAREAN SECTION  1980  . CESAREAN SECTION  1984  . CHOLECYSTECTOMY N/A 03/30/2016   Procedure: LAPAROSCOPIC CHOLECYSTECTOMY WITH INTRAOPERATIVE CHOLANGIOGRAM;  Surgeon: Jules Husbands, MD;  Location: [facility];  Service: General;   Laterality: N/A;  . [place]  [facility]  . CORONARY STENT INTERVENTION N/A 09/05/2019   Procedure: CORONARY STENT INTERVENTION;  Surgeon: Isaias Cowman, MD;  Location: [facility];  Service: Cardiovascular;  Laterality: N/A;  . HERNIA REPAIR  11/2016   [facility]; Dr. Gaynelle Arabian  . LEFT HEART CATH AND CORONARY ANGIOGRAPHY N/A 09/05/2019   Procedure: LEFT HEART CATH AND CORONARY ANGIOGRAPHY;  Surgeon: Minna Merritts, MD;  Location: [facility];  Service: Cardiovascular;  Laterality: N/A;  . VAGINAL HYSTERECTOMY      Allergies  Allergies  Allergen Reactions  . Adhesive [Tape] Other (See Comments)    Don't use flex/fabric bandaids Paper tape okay  . Paroxetine Hcl Other (See Comments)    Made her feel crazy and she would cry all the time  . Crestor [Rosuvastatin Calcium] Other (See Comments)    aches  . Effexor [Venlafaxine] Other (See Comments)    irritable  . Lipitor [Atorvastatin] Other (See Comments)    Intolerant. Muscle pain.  Takes a lower dose and does okay  . Sertraline Hcl Other (See Comments)    Made her  feel crazy  . Wellbutrin [Bupropion Hcl] Other (See Comments)    Make her feel "crazy"    History of Present Illness    64 year old female with the above complex past medical history including coronary artery disease, hypertension, hyperlipidemia, obesity, GERD, sleep apnea, and remote tobacco abuse.  She suffered a myocardial infarction at age 67 and required PTCA of an unknown vessel at that time.  In June 2016, she underwent diagnostic catheterization revealing a chronic total occlusion of the RCA with severe mid circumflex disease (90%), and moderate proximal LAD disease.  Pressure wire was performed within the LAD and showed a normal FFR at 0.83.  The circumflex was felt to be the culprit and was successfully treated with a 2.75 x  15 mm Xience Alpine drug-eluting stent.  She was hospitalized in the fall 2020 in the setting of angina.  Echo showed EF of 60 to 65% with grade 1 diastolic dysfunction and mild to moderate pulmonary hypertension with a PASP of 45 mmHg.  Catheterization showed chronic total occlusion of the mid RCA, moderate proximal LAD disease, and patent left circumflex stent with severe proximal stenosis and moderate distal disease.  The proximal circumflex was successfully treated with a drug-eluting stent.  She was last seen via telemedicine visit in January of this year, at which time she was doing well from a cardiac standpoint but was experienced significant right-sided abdominal discomfort in the setting of recurrent ventral hernia.  She has been putting off surgery until she hit at least 6 months post PCI, so that Plavix could be safely held.  Despite ongoing intermittent abdominal discomfort, she has done well from a cardiac standpoint.  She is walking between 20 and 40 minutes most days of the week without experiencing chest pain or dyspnea.  She denies palpitations, PND, orthopnea, dizziness, syncope, edema, or early satiety.  Home Medications    Prior to Admission medications   Medication Sig Start Date End Date Taking? Authorizing Provider  albuterol (VENTOLIN HFA) 108 (90 Base) MCG/ACT inhaler INHALE 2 PUFFS EVERY 6  HOURS AS NEEDED FOR WHEEZING OR SHORTNESS OF BREATH. Patient taking differently: Inhale 2 puffs into the lungs every 6 (six) hours as needed for wheezing or shortness of breath.  04/03/20   Tonia Ghent, MD  ALPRAZolam Duanne Moron) 1 MG tablet Take 1 tablet (1 mg total) by mouth 3 (three) times daily as needed for anxiety. 01/22/20   Tonia Ghent, MD  aspirin 81 MG EC tablet Take 81 mg by mouth daily.     [provider]  atorvastatin (LIPITOR) 20 MG tablet TAKE 1 TABLET EVERY DAY  Patient taking differently: Take 20 mg by mouth daily.  11/18/19   Tonia Ghent, MD    Cholecalciferol (VITAMIN D) 50 MCG (2000 UT) CAPS Take 2,000 Units by mouth daily.     [provider]  clopidogrel (PLAVIX) 75 MG tablet Take 1 tablet (75 mg total) by mouth daily with breakfast. 11/01/19   Tonia Ghent, MD  ezetimibe (ZETIA) 10 MG tablet Take 1 tablet (10 mg total) by mouth daily. 11/01/19 04/16/20  Tonia Ghent, MD  fluticasone Asencion Islam) 50 MCG/ACT nasal spray USE 2 SPRAYS IN [facility] NOSTRIL ONE TIME DAILY 04/17/20   Tonia Ghent, MD  isosorbide mononitrate (IMDUR) 60 MG 24 hr tablet TAKE 1 TABLET EVERY DAY Patient taking differently: Take 60 mg by mouth daily.  01/21/20   Tonia Ghent, MD  metoprolol tartrate (LOPRESSOR) 25 MG tablet  TAKE 1 TABLET TWICE DAILY Patient taking differently: Take 25 mg by mouth 2 (two) times daily.  11/20/19   Tonia Ghent, MD  nitroGLYCERIN (NITROSTAT) 0.4 MG SL tablet Place 1 tablet (0.4 mg total) under the tongue every 5 (five) minutes as needed for chest pain. 12/03/19   Wellington Hampshire, MD  pantoprazole (PROTONIX) 40 MG tablet TAKE 1 TABLET EVERY DAY Patient taking differently: Take 40 mg by mouth daily.  01/21/20   Tonia Ghent, MD  senna (SENOKOT) 8.6 MG tablet Take 1 tablet by mouth at bedtime. Takes at night    [provider]  Wheat Dextrin (EQ FIBER POWDER PO) Take 1 application by mouth 2 (two) times daily.    [provider]  zolpidem (AMBIEN) 10 MG tablet Take 1 tablet (10 mg total) by mouth at bedtime as needed. for sleep Patient taking differently: Take 10 mg by mouth at bedtime as needed for sleep.  01/22/20   Tonia Ghent, MD    Review of Systems    Right-sided abdominal discomfort times several months in the setting of recurrent ventral hernia.  She denies chest pain, palpitations, dyspnea, pnd, orthopnea, n, v, dizziness, syncope, edema, weight gain, or early satiety.  All other systems reviewed and are otherwise negative except as noted above.  Physical Exam    VS:  BP 120/60  (BP Location: Left Arm, Patient Position: Sitting, Cuff Size: Normal)   Pulse 60   Ht 5' (1.524 m)   Wt 202 lb (91.6 kg)   SpO2 98%   BMI 39.45 kg/m  , BMI Body mass index is 39.45 kg/m. GEN: Well nourished, well developed, in no acute distress. HEENT: normal. Neck: Supple, no JVD, carotid bruits, or masses. Cardiac: RRR, 3/6 SEM @ upper sternal borders, no rubs, or gallops. No clubbing, cyanosis, edema.  Radial 2+ bilat, DP/PT 1+ and equal bilaterally.  Respiratory:  Respirations regular and unlabored, clear to auscultation bilaterally. GI: Soft, nontender, nondistended, BS + x 4. MS: no deformity or atrophy. Skin: warm and dry, no rash. Neuro:  Strength and sensation are intact. Psych: Normal affect.  Accessory Clinical Findings    ECG personally reviewed by me today -regular sinus rhythm, 60, right bundle branch block - no acute changes.  Lab Results  Component Value Date   WBC 10.3 09/06/2019   HGB 11.6 (L) 09/06/2019   HCT 35.2 (L) 09/06/2019   MCV 83.4 09/06/2019   PLT 312 09/06/2019   Lab Results  Component Value Date   CREATININE 0.90 04/08/2020   BUN 6 (L) 09/06/2019   NA 136 09/06/2019   K 3.9 09/06/2019   CL 101 09/06/2019   CO2 25 09/06/2019   Lab Results  Component Value Date   ALT 22 09/04/2019   AST 31 09/04/2019   ALKPHOS 116 09/04/2019   BILITOT 1.5 (H) 09/04/2019   Lab Results  Component Value Date   CHOL 124 09/05/2019   HDL 32 (L) 09/05/2019   LDLCALC 66 09/05/2019   TRIG 130 09/05/2019   CHOLHDL 3.9 09/05/2019    Lab Results  Component Value Date   HGBA1C 5.9 06/19/2019    Assessment & Plan    1.  Coronary artery disease/preoperative cardiovascular examination: Status post remote MI and PTCA at age 15 with subsequent drug-eluting stent placement to the left circumflex in 2016.  More recently, in October 2020, she was admitted with unstable angina and catheterization revealed severe proximal circumflex disease with a patent circumflex  stent.  The proximal circumflex was also treated with a drug-eluting stent.  She has done well from a cardiovascular standpoint since then without any recurrent chest pain or dyspnea.  She is now pending ventral hernia repair in the setting of ongoing pain for several months.  Risk for cardiovascular complication is approximately 6.6%.  As she has remained active without recurrent symptoms or limitations, she will not require any additional ischemic testing prior to surgery.  As it has been greater than 6 months since her most recent PCI, she may safely hold Plavix for 5 days prior to surgery with a plan to resume postoperatively.  I recommend continuation of aspirin, beta-blocker, and statin therapy throughout the perioperative period.  2.  Essential hypertension: Stable on beta-blocker and nitrate therapy.  3.  Hyperlipidemia: LDL of 66 in October 2020 with normal LFTs at that time.  She remains on statin and Zetia therapy.  4.  Aortic sclerosis murmur: Loud systolic murmur on exam however, echo in October showed no significant aortic stenosis.  5.  Disposition: Follow-up in clinic in 6 months or sooner if necessary.  Murray Hodgkins, NP 04/24/2020, 12:45 PM

## 2020-04-27 ENCOUNTER — Other Ambulatory Visit: Payer: Self-pay

## 2020-04-27 ENCOUNTER — Ambulatory Visit: Payer: Medicare HMO | Admitting: Physician Assistant

## 2020-04-27 ENCOUNTER — Other Ambulatory Visit
Admission: RE | Admit: 2020-04-27 | Discharge: 2020-04-27 | Disposition: A | Discharge status: Home / Self Care | Payer: Medicare HMO | Source: Ambulatory Visit | Attending: General Surgery | Admitting: General Surgery

## 2020-04-27 DIAGNOSIS — Z01812 Encounter for preprocedural laboratory examination: Secondary | ICD-10-CM | POA: Diagnosis not present

## 2020-04-27 DIAGNOSIS — Z20822 Contact with and (suspected) exposure to covid-19: Secondary | ICD-10-CM | POA: Diagnosis not present

## 2020-04-27 LAB — BASIC METABOLIC PANEL
Anion gap: 9 (ref 5–15)
BUN: 9 mg/dL (ref 8–23)
CO2: 26 mmol/L (ref 22–32)
Calcium: 9.1 mg/dL (ref 8.9–10.3)
Chloride: 102 mmol/L (ref 98–111)
Creatinine, Ser: 0.93 mg/dL (ref 0.44–1.00)
GFR calc Af Amer: >60 mL/min (ref >60)
GFR calc non Af Amer: >60 mL/min (ref >60)
Glucose, Bld: 102 mg/dL — ABNORMAL HIGH (ref 70–99)
Potassium: 4.2 mmol/L (ref 3.5–5.1)
Sodium: 137 mmol/L (ref 135–145)

## 2020-04-27 LAB — CBC
HCT: 33.4 % — ABNORMAL LOW (ref 36.0–46.0)
Hemoglobin: 10.9 g/dL — ABNORMAL LOW (ref 12.0–15.0)
MCH: 24.8 pg — ABNORMAL LOW (ref 26.0–34.0)
MCHC: 32.6 g/dL (ref 30.0–36.0)
MCV: 75.9 fL — ABNORMAL LOW (ref 80.0–100.0)
Platelets: 295 10*3/uL (ref 150–400)
RBC: 4.4 MIL/uL (ref 3.87–5.11)
RDW: 16.5 % — ABNORMAL HIGH (ref 11.5–15.5)
WBC: 9 10*3/uL (ref 4.0–10.5)
nRBC: 0 % (ref 0.0–0.2)

## 2020-04-27 LAB — SARS CORONAVIRUS 2 (TAT 6-24 HRS): SARS Coronavirus 2: NEGATIVE

## 2020-04-27 NOTE — Pre-Procedure Instructions (Signed)
Pre-Admit Testing Provider Notification Note  Provider Notified: Dr. Bary Castilla  Notification Mode: Fax  Reason: Abnormal lab result.  Response: Fax confirmation received.   Additional Information: Placed on chart. Noted on Pre-Admit Worksheet.  Signed: Beulah Gandy, RN

## 2020-04-29 ENCOUNTER — Other Ambulatory Visit: Payer: Self-pay

## 2020-04-29 ENCOUNTER — Ambulatory Visit
Admission: RE | Admit: 2020-04-29 | Discharge: 2020-04-29 | Disposition: A | Discharge status: Home / Self Care | Payer: Medicare HMO | Attending: General Surgery | Admitting: General Surgery

## 2020-04-29 ENCOUNTER — Encounter
Admission: RE | Disposition: A | Discharge status: Home / Self Care | Payer: Self-pay | Source: Home / Self Care | Attending: General Surgery

## 2020-04-29 ENCOUNTER — Encounter: Payer: Self-pay | Admitting: General Surgery

## 2020-04-29 ENCOUNTER — Ambulatory Visit: Payer: Medicare HMO | Admitting: Certified Registered"

## 2020-04-29 ENCOUNTER — Ambulatory Visit: Payer: Medicare HMO

## 2020-04-29 DIAGNOSIS — K219 Gastro-esophageal reflux disease without esophagitis: Secondary | ICD-10-CM | POA: Diagnosis not present

## 2020-04-29 DIAGNOSIS — Z6838 Body mass index (BMI) 38.0-38.9, adult: Secondary | ICD-10-CM | POA: Diagnosis not present

## 2020-04-29 DIAGNOSIS — K432 Incisional hernia without obstruction or gangrene: Secondary | ICD-10-CM | POA: Insufficient documentation

## 2020-04-29 DIAGNOSIS — R1084 Generalized abdominal pain: Secondary | ICD-10-CM | POA: Diagnosis not present

## 2020-04-29 DIAGNOSIS — I10 Essential (primary) hypertension: Secondary | ICD-10-CM | POA: Insufficient documentation

## 2020-04-29 DIAGNOSIS — E7800 Pure hypercholesterolemia, unspecified: Secondary | ICD-10-CM | POA: Insufficient documentation

## 2020-04-29 DIAGNOSIS — K439 Ventral hernia without obstruction or gangrene: Secondary | ICD-10-CM | POA: Diagnosis not present

## 2020-04-29 DIAGNOSIS — G8918 Other acute postprocedural pain: Secondary | ICD-10-CM | POA: Diagnosis not present

## 2020-04-29 DIAGNOSIS — M1990 Unspecified osteoarthritis, unspecified site: Secondary | ICD-10-CM | POA: Insufficient documentation

## 2020-04-29 DIAGNOSIS — Z7982 Long term (current) use of aspirin: Secondary | ICD-10-CM | POA: Insufficient documentation

## 2020-04-29 DIAGNOSIS — Z87891 Personal history of nicotine dependence: Secondary | ICD-10-CM | POA: Diagnosis not present

## 2020-04-29 DIAGNOSIS — Z955 Presence of coronary angioplasty implant and graft: Secondary | ICD-10-CM | POA: Diagnosis not present

## 2020-04-29 DIAGNOSIS — I25119 Atherosclerotic heart disease of native coronary artery with unspecified angina pectoris: Secondary | ICD-10-CM | POA: Diagnosis not present

## 2020-04-29 DIAGNOSIS — Z7902 Long term (current) use of antithrombotics/antiplatelets: Secondary | ICD-10-CM | POA: Diagnosis not present

## 2020-04-29 DIAGNOSIS — E785 Hyperlipidemia, unspecified: Secondary | ICD-10-CM | POA: Diagnosis not present

## 2020-04-29 DIAGNOSIS — F418 Other specified anxiety disorders: Secondary | ICD-10-CM | POA: Diagnosis not present

## 2020-04-29 DIAGNOSIS — I2510 Atherosclerotic heart disease of native coronary artery without angina pectoris: Secondary | ICD-10-CM | POA: Insufficient documentation

## 2020-04-29 DIAGNOSIS — K660 Peritoneal adhesions (postprocedural) (postinfection): Secondary | ICD-10-CM | POA: Insufficient documentation

## 2020-04-29 DIAGNOSIS — Z79899 Other long term (current) drug therapy: Secondary | ICD-10-CM | POA: Diagnosis not present

## 2020-04-29 DIAGNOSIS — I252 Old myocardial infarction: Secondary | ICD-10-CM | POA: Diagnosis not present

## 2020-04-29 DIAGNOSIS — G4700 Insomnia, unspecified: Secondary | ICD-10-CM | POA: Diagnosis not present

## 2020-04-29 DIAGNOSIS — G4730 Sleep apnea, unspecified: Secondary | ICD-10-CM | POA: Diagnosis not present

## 2020-04-29 DIAGNOSIS — F419 Anxiety disorder, unspecified: Secondary | ICD-10-CM | POA: Diagnosis not present

## 2020-04-29 DIAGNOSIS — F329 Major depressive disorder, single episode, unspecified: Secondary | ICD-10-CM | POA: Diagnosis not present

## 2020-04-29 DIAGNOSIS — Z419 Encounter for procedure for purposes other than remedying health state, unspecified: Secondary | ICD-10-CM

## 2020-04-29 HISTORY — PX: VENTRAL HERNIA REPAIR: SHX424

## 2020-04-29 HISTORY — PX: EXCISION OF MESH: SHX6268

## 2020-04-29 SURGERY — REPAIR, HERNIA, VENTRAL
Anesthesia: General | Site: Abdomen

## 2020-04-29 MED ORDER — BUPIVACAINE LIPOSOME 1.3 % IJ SUSP
INTRAMUSCULAR | Status: DC | PRN
Start: 2020-04-29 — End: 2020-04-29
  Administered 2020-04-29 (×2): 10 mL

## 2020-04-29 MED ORDER — SODIUM CHLORIDE (PF) 0.9 % IJ SOLN
INTRAMUSCULAR | Status: AC
  Filled 2020-04-29: qty 10

## 2020-04-29 MED ORDER — KETOROLAC TROMETHAMINE 30 MG/ML IJ SOLN
INTRAMUSCULAR | Status: DC | PRN
  Administered 2020-04-29: 30 mg via INTRAVENOUS

## 2020-04-29 MED ORDER — HYDROMORPHONE HCL 1 MG/ML IJ SOLN: 0.2500 mg | INTRAMUSCULAR | Status: DC | PRN

## 2020-04-29 MED ORDER — ORAL CARE MOUTH RINSE: 15.0000 mL | Freq: Once | OROMUCOSAL | Status: AC

## 2020-04-29 MED ORDER — SUGAMMADEX SODIUM 200 MG/2ML IV SOLN
INTRAVENOUS | Status: DC | PRN
  Administered 2020-04-29: 200 mg via INTRAVENOUS

## 2020-04-29 MED ORDER — SUCCINYLCHOLINE CHLORIDE 200 MG/10ML IV SOSY
PREFILLED_SYRINGE | INTRAVENOUS | Status: AC
  Filled 2020-04-29: qty 10

## 2020-04-29 MED ORDER — MIDAZOLAM HCL 2 MG/2ML IJ SOLN
INTRAMUSCULAR | Status: AC
  Administered 2020-04-29: 1 mg via INTRAVENOUS
  Filled 2020-04-29: qty 2

## 2020-04-29 MED ORDER — DEXMEDETOMIDINE HCL IN NACL 200 MCG/50ML IV SOLN
INTRAVENOUS | Status: DC | PRN
Start: 2020-04-29 — End: 2020-04-29
  Administered 2020-04-29 (×2): 12 ug via INTRAVENOUS

## 2020-04-29 MED ORDER — SEVOFLURANE IN SOLN
RESPIRATORY_TRACT | Status: AC
  Filled 2020-04-29: qty 250

## 2020-04-29 MED ORDER — LIDOCAINE HCL (PF) 2 % IJ SOLN
INTRAMUSCULAR | Status: AC
  Filled 2020-04-29: qty 5

## 2020-04-29 MED ORDER — DEXAMETHASONE SODIUM PHOSPHATE 10 MG/ML IJ SOLN
INTRAMUSCULAR | Status: AC
  Filled 2020-04-29: qty 1

## 2020-04-29 MED ORDER — BUPIVACAINE HCL (PF) 0.5 % IJ SOLN
INTRAMUSCULAR | Status: AC
  Filled 2020-04-29: qty 10

## 2020-04-29 MED ORDER — CEFAZOLIN SODIUM-DEXTROSE 2-4 GM/100ML-% IV SOLN
2.0000 g | INTRAVENOUS | Status: AC
  Administered 2020-04-29: 2 g via INTRAVENOUS

## 2020-04-29 MED ORDER — ONDANSETRON HCL 4 MG/2ML IJ SOLN
INTRAMUSCULAR | Status: DC | PRN
  Administered 2020-04-29: 4 mg via INTRAVENOUS

## 2020-04-29 MED ORDER — ONDANSETRON HCL 4 MG/2ML IJ SOLN
INTRAMUSCULAR | Status: AC
  Filled 2020-04-29: qty 2

## 2020-04-29 MED ORDER — HYDROCODONE-ACETAMINOPHEN 5-325 MG PO TABS: 1.0000 | ORAL_TABLET | ORAL | 0 refills | Status: DC | PRN

## 2020-04-29 MED ORDER — FENTANYL CITRATE (PF) 100 MCG/2ML IJ SOLN
INTRAMUSCULAR | Status: DC | PRN
  Administered 2020-04-29 (×2): 50 ug via INTRAVENOUS

## 2020-04-29 MED ORDER — ROCURONIUM BROMIDE 100 MG/10ML IV SOLN
INTRAVENOUS | Status: DC | PRN
  Administered 2020-04-29: 5 mg via INTRAVENOUS
  Administered 2020-04-29: 10 mg via INTRAVENOUS
  Administered 2020-04-29: 15 mg via INTRAVENOUS
  Administered 2020-04-29: 35 mg via INTRAVENOUS

## 2020-04-29 MED ORDER — LIDOCAINE HCL (PF) 1 % IJ SOLN
INTRAMUSCULAR | Status: DC | PRN
Start: 2020-04-29 — End: 2020-04-29
  Administered 2020-04-29 (×2): 5 mL via SUBCUTANEOUS

## 2020-04-29 MED ORDER — FENTANYL CITRATE (PF) 100 MCG/2ML IJ SOLN
INTRAMUSCULAR | Status: AC
  Filled 2020-04-29: qty 2

## 2020-04-29 MED ORDER — MIDAZOLAM HCL 2 MG/2ML IJ SOLN
INTRAMUSCULAR | Status: DC | PRN
  Administered 2020-04-29: 2 mg via INTRAVENOUS

## 2020-04-29 MED ORDER — LIDOCAINE HCL (PF) 1 % IJ SOLN
INTRAMUSCULAR | Status: AC
  Filled 2020-04-29: qty 10

## 2020-04-29 MED ORDER — GLYCOPYRROLATE 0.2 MG/ML IJ SOLN
INTRAMUSCULAR | Status: DC | PRN
  Administered 2020-04-29: .2 mg via INTRAVENOUS

## 2020-04-29 MED ORDER — CHLORHEXIDINE GLUCONATE 0.12 % MT SOLN
OROMUCOSAL | Status: AC
  Filled 2020-04-29: qty 15

## 2020-04-29 MED ORDER — DEXAMETHASONE SODIUM PHOSPHATE 10 MG/ML IJ SOLN
INTRAMUSCULAR | Status: DC | PRN
  Administered 2020-04-29: 10 mg via INTRAVENOUS

## 2020-04-29 MED ORDER — PROPOFOL 10 MG/ML IV BOLUS
INTRAVENOUS | Status: AC
  Filled 2020-04-29: qty 20

## 2020-04-29 MED ORDER — BUPIVACAINE LIPOSOME 1.3 % IJ SUSP
INTRAMUSCULAR | Status: AC
  Filled 2020-04-29: qty 20

## 2020-04-29 MED ORDER — ACETAMINOPHEN 10 MG/ML IV SOLN
INTRAVENOUS | Status: AC
  Filled 2020-04-29: qty 100

## 2020-04-29 MED ORDER — SUCCINYLCHOLINE CHLORIDE 20 MG/ML IJ SOLN
INTRAMUSCULAR | Status: DC | PRN
  Administered 2020-04-29: 100 mg via INTRAVENOUS

## 2020-04-29 MED ORDER — PROMETHAZINE HCL 25 MG/ML IJ SOLN: 6.2500 mg | INTRAMUSCULAR | Status: DC | PRN

## 2020-04-29 MED ORDER — CHLORHEXIDINE GLUCONATE 0.12 % MT SOLN
15.0000 mL | Freq: Once | OROMUCOSAL | Status: AC
  Administered 2020-04-29: 15 mL via OROMUCOSAL

## 2020-04-29 MED ORDER — BUPIVACAINE HCL (PF) 0.5 % IJ SOLN
INTRAMUSCULAR | Status: DC | PRN
Start: 2020-04-29 — End: 2020-04-29
  Administered 2020-04-29 (×2): 5 mL

## 2020-04-29 MED ORDER — LACTATED RINGERS IV SOLN: INTRAVENOUS | Status: DC

## 2020-04-29 MED ORDER — GLYCOPYRROLATE 0.2 MG/ML IJ SOLN
INTRAMUSCULAR | Status: AC
  Filled 2020-04-29: qty 1

## 2020-04-29 MED ORDER — CEFAZOLIN SODIUM-DEXTROSE 2-4 GM/100ML-% IV SOLN
INTRAVENOUS | Status: AC
  Filled 2020-04-29: qty 100

## 2020-04-29 MED ORDER — ACETAMINOPHEN 10 MG/ML IV SOLN
INTRAVENOUS | Status: DC | PRN
  Administered 2020-04-29: 1000 mg via INTRAVENOUS

## 2020-04-29 MED ORDER — PROPOFOL 10 MG/ML IV BOLUS
INTRAVENOUS | Status: DC | PRN
  Administered 2020-04-29: 150 mg via INTRAVENOUS

## 2020-04-29 MED ORDER — LIDOCAINE HCL (CARDIAC) PF 100 MG/5ML IV SOSY
PREFILLED_SYRINGE | INTRAVENOUS | Status: DC | PRN
  Administered 2020-04-29: 50 mg via INTRAVENOUS

## 2020-04-29 MED ORDER — ROCURONIUM BROMIDE 10 MG/ML (PF) SYRINGE
PREFILLED_SYRINGE | INTRAVENOUS | Status: AC
  Filled 2020-04-29: qty 10

## 2020-04-29 MED ORDER — KETOROLAC TROMETHAMINE 30 MG/ML IJ SOLN
INTRAMUSCULAR | Status: AC
  Filled 2020-04-29: qty 1

## 2020-04-29 MED ORDER — CLOPIDOGREL BISULFATE 75 MG PO TABS: 75.0000 mg | ORAL_TABLET | Freq: Every day | ORAL | 0 refills | Status: DC

## 2020-04-29 MED ORDER — MIDAZOLAM HCL 2 MG/2ML IJ SOLN
2.0000 mg | Freq: Once | INTRAMUSCULAR | Status: AC
  Administered 2020-04-29: 1 mg via INTRAVENOUS

## 2020-04-29 MED ORDER — MIDAZOLAM HCL 2 MG/2ML IJ SOLN
INTRAMUSCULAR | Status: AC
  Filled 2020-04-29: qty 2

## 2020-04-29 SURGICAL SUPPLY — 48 items
BENZOIN TINCTURE PRP APPL 2/3 (GAUZE/BANDAGES/DRESSINGS) ×1 IMPLANT
BINDER ABDOMINAL 12 ML 46-62 (SOFTGOODS) ×2 IMPLANT
BLADE SURG 15 STRL SS SAFETY (BLADE) ×3 IMPLANT
BULB RESERV EVAC DRAIN JP 100C (MISCELLANEOUS) IMPLANT
CANISTER SUCT 1200ML W/VALVE (MISCELLANEOUS) ×3 IMPLANT
CHLORAPREP W/TINT 26 (MISCELLANEOUS) ×3 IMPLANT
CLOSURE WOUND 1/2 X4 (GAUZE/BANDAGES/DRESSINGS) ×1
COVER WAND RF STERILE (DRAPES) ×3 IMPLANT
DRAIN CHANNEL JP 15F RND 16 (MISCELLANEOUS) IMPLANT
DRAPE LAPAROTOMY 100X77 ABD (DRAPES) ×3 IMPLANT
DRSG OPSITE POSTOP 4X8 (GAUZE/BANDAGES/DRESSINGS) ×2 IMPLANT
DRSG TEGADERM 4X4.75 (GAUZE/BANDAGES/DRESSINGS) ×2 IMPLANT
DRSG TELFA 3X8 NADH (GAUZE/BANDAGES/DRESSINGS) ×3 IMPLANT
ELECT REM PT RETURN 9FT ADLT (ELECTROSURGICAL) ×3
ELECTRODE REM PT RTRN 9FT ADLT (ELECTROSURGICAL) ×1 IMPLANT
GAUZE SPONGE 4X4 12PLY STRL (GAUZE/BANDAGES/DRESSINGS) IMPLANT
GLOVE BIO SURGEON STRL SZ7.5 (GLOVE) ×3 IMPLANT
GLOVE INDICATOR 8.0 STRL GRN (GLOVE) ×3 IMPLANT
GOWN STRL REUS W/ TWL LRG LVL3 (GOWN DISPOSABLE) ×2 IMPLANT
GOWN STRL REUS W/TWL LRG LVL3 (GOWN DISPOSABLE) ×4
GRASPER SUT TROCAR 14GX15 (MISCELLANEOUS) ×2 IMPLANT
KIT TURNOVER KIT A (KITS) ×3 IMPLANT
LABEL OR SOLS (LABEL) ×3 IMPLANT
MESH SYNTHETIC 4X6 SOFT BARD (Mesh General) IMPLANT
MESH SYNTHETIC SOFT BARD 4X6 (Mesh General) ×2 IMPLANT
NDL HYPO 25X1 1.5 SAFETY (NEEDLE) ×1 IMPLANT
NEEDLE HYPO 22GX1.5 SAFETY (NEEDLE) ×3 IMPLANT
NEEDLE HYPO 25X1 1.5 SAFETY (NEEDLE) ×3 IMPLANT
NS IRRIG 500ML POUR BTL (IV SOLUTION) ×3 IMPLANT
PACK BASIN MINOR (MISCELLANEOUS) ×3 IMPLANT
PAD DRESSING TELFA 3X8 NADH (GAUZE/BANDAGES/DRESSINGS) ×1 IMPLANT
SPONGE LAP 18X18 RF (DISPOSABLE) ×3 IMPLANT
STAPLER SKIN PROX 35W (STAPLE) IMPLANT
STRIP CLOSURE SKIN 1/2X4 (GAUZE/BANDAGES/DRESSINGS) ×2 IMPLANT
SUT MAXON 0 T 12 3 (SUTURE) ×8 IMPLANT
SUT MAXON ABS #0 GS21 30IN (SUTURE) ×6 IMPLANT
SUT SURGILON 0 BLK (SUTURE) ×3 IMPLANT
SUT VIC AB 2-0 BRD 54 (SUTURE) ×5 IMPLANT
SUT VIC AB 2-0 CT1 (SUTURE) ×4 IMPLANT
SUT VIC AB 3-0 SH 27 (SUTURE) ×2
SUT VIC AB 3-0 SH 27X BRD (SUTURE) ×1 IMPLANT
SUT VIC AB 4-0 FS2 27 (SUTURE) ×3 IMPLANT
SUT VICRYL+ 3-0 144IN (SUTURE) ×1 IMPLANT
SYR 10ML LL (SYRINGE) ×3 IMPLANT
SYR 3ML LL SCALE MARK (SYRINGE) ×3 IMPLANT
TAPE STRIPS DRAPE STRL (GAUZE/BANDAGES/DRESSINGS) ×2 IMPLANT
TAPE TRANSPORE STRL 2 31045 (GAUZE/BANDAGES/DRESSINGS) ×2 IMPLANT
TRAY FOLEY MTR SLVR 16FR STAT (SET/KITS/TRAYS/PACK) IMPLANT

## 2020-04-29 NOTE — Anesthesia Preprocedure Evaluation (Signed)
Anesthesia Evaluation  Patient identified by MRN, date of birth, ID band Patient awake    Reviewed: Allergy & Precautions, H&P , NPO status , Patient's Chart, lab work & pertinent test results, reviewed documented beta blocker date and time   History of Anesthesia Complications (+) PONV and history of anesthetic complications  Airway Mallampati: III  TM Distance: >3 FB Neck ROM: full    Dental no notable dental hx. (+) Partial Upper, Missing, Chipped, Caps   Pulmonary neg shortness of breath, neg sleep apnea, neg COPD, neg recent URI, former smoker,    Pulmonary exam normal breath sounds clear to auscultation       Cardiovascular Exercise Tolerance: Good hypertension, (-) angina+ CAD, + Past MI and + Cardiac Stents  (-) CABG Normal cardiovascular exam(-) dysrhythmias + Valvular Problems/Murmurs  Rhythm:regular Rate:Normal     Neuro/Psych neg Seizures PSYCHIATRIC DISORDERS Anxiety Depression  Neuromuscular disease    GI/Hepatic Neg liver ROS, GERD  ,  Endo/Other  neg diabetesMorbid obesity  Renal/GU negative Renal ROS  negative genitourinary   Musculoskeletal   Abdominal   Peds  Hematology negative hematology ROS (+)   Anesthesia Other Findings Past Medical History:   Arthritis                                                    headaches                                                    GERD (gastroesophageal reflux disease)                       Hayfever                                                     CAD (coronary artery disease)                                  Comment:a. s/p MI & PTCA;  b. Ex MV: basal inflat, mid               inflat, apical inf, apical lat ischemia;  c.               04/2015 PCI: LM nl, LAD 60p (FFR 0.83), D1               60ost, LCX 50p, 36m (2.75x15 Xience Alpine               DES), OM1/2/3 min irregs, RCA 133m CTO.   High cholesterol                                              Sciatica  Smoker                                                         Comment:a. quit 01/2014.   RLS (restless legs syndrome)                                 Insomnia                                                     Anxiety                                                      Depression                                                     Comment:intolerant of paxil, wellbutrin, zoloft   Baker's cyst                                                   Comment:x 2   Heart murmur                                                 Hypertension                                                 Myocardial infarction ([place])                                  Reproductive/Obstetrics negative OB ROS                             Anesthesia Physical  Anesthesia Plan  ASA: III  Anesthesia Plan: General   Post-op Pain Management:  Regional for Post-op pain   Induction: Intravenous  PONV Risk Score and Plan: 4 or greater and Ondansetron, Dexamethasone, Midazolam and Promethazine  Airway Management Planned: Oral ETT  Additional Equipment:   Intra-op Plan:   Post-operative Plan: Extubation in OR  Informed Consent: I have reviewed the patients History and Physical, chart, labs and discussed the procedure including the risks, benefits and alternatives for the proposed anesthesia with the patient or authorized representative who has indicated his/her understanding and acceptance.     Dental Advisory Given  Plan Discussed with:  Anesthesiologist, CRNA and Surgeon  Anesthesia Plan Comments:         Anesthesia Quick Evaluation

## 2020-04-29 NOTE — Anesthesia Postprocedure Evaluation (Signed)
Anesthesia Post Note  Patient: Andrea Webster  Procedure(s) Performed: HERNIA REPAIR VENTRAL ADULT (N/A Abdomen) EXCISION OF MESH (N/A Abdomen)  Patient location during evaluation: PACU Anesthesia Type: General Level of consciousness: awake and alert Pain management: pain level controlled Vital Signs Assessment: post-procedure vital signs reviewed and stable Respiratory status: spontaneous breathing and respiratory function stable Cardiovascular status: stable Anesthetic complications: no     Last Vitals:  Vitals:   04/29/20 1527 04/29/20 1543  BP: (!) 149/76 127/68  Pulse: 89 68  Resp: (!) 22 18  Temp: 36.8 C   SpO2: 97% 98%    Last Pain:  Vitals:   04/29/20 1543  TempSrc:   PainSc: 0-No pain                 Silvano Garofano K

## 2020-04-29 NOTE — Anesthesia Preprocedure Evaluation (Deleted)
Anesthesia Evaluation    Airway        Dental   Pulmonary sleep apnea , former smoker,           Cardiovascular hypertension, Pt. on medications + angina + Past MI       Neuro/Psych Anxiety Depression    GI/Hepatic GERD  Medicated and Controlled,  Endo/Other    Renal/GU      Musculoskeletal   Abdominal   Peds  Hematology   Anesthesia Other Findings   Reproductive/Obstetrics                                                              Anesthesia Evaluation  Patient identified by MRN, date of birth, ID band Patient awake    Reviewed: Allergy & Precautions, H&P , NPO status , Patient's Chart, lab work & pertinent test results, reviewed documented beta blocker date and time   History of Anesthesia Complications (+) PONV and history of anesthetic complications  Airway Mallampati: III  TM Distance: >3 FB Neck ROM: full    Dental no notable dental hx. (+) Partial Upper, Missing, Chipped, Caps   Pulmonary neg shortness of breath, neg sleep apnea, neg COPD, neg recent URI, former smoker,    Pulmonary exam normal breath sounds clear to auscultation       Cardiovascular Exercise Tolerance: Good hypertension, (-) angina+ CAD, + Past MI and + Cardiac Stents  (-) CABG Normal cardiovascular exam(-) dysrhythmias + Valvular Problems/Murmurs  Rhythm:regular Rate:Normal     Neuro/Psych neg Seizures PSYCHIATRIC DISORDERS (Depression and anxiety)  Neuromuscular disease    GI/Hepatic Neg liver ROS, GERD  ,  Endo/Other  neg diabetesMorbid obesity  Renal/GU negative Renal ROS  negative genitourinary   Musculoskeletal   Abdominal   Peds  Hematology negative hematology ROS (+)   Anesthesia Other Findings Past Medical History:   Arthritis                                                    headaches                                                    GERD (gastroesophageal  reflux disease)                       Hayfever                                                     CAD (coronary artery disease)                                  Comment:a. s/p MI & PTCA;  b. Ex MV: basal inflat, mid  inflat, apical inf, apical lat ischemia;  c.               04/2015 PCI: LM nl, LAD 60p (FFR 0.83), D1               60ost, LCX 50p, 73m (2.75x15 Xience Alpine               DES), OM1/2/3 min irregs, RCA 166m CTO.   High cholesterol                                             Sciatica                                                     Smoker                                                         Comment:a. quit 01/2014.   RLS (restless legs syndrome)                                 Insomnia                                                     Anxiety                                                      Depression                                                     Comment:intolerant of paxil, wellbutrin, zoloft   Baker's cyst                                                   Comment:x 2   Heart murmur                                                 Hypertension                                                 Myocardial infarction ([place])  Reproductive/Obstetrics negative OB ROS                             Anesthesia Physical Anesthesia Plan  ASA: III  Anesthesia Plan: General   Post-op Pain Management:    Induction:   Airway Management Planned:   Additional Equipment:   Intra-op Plan:   Post-operative Plan:   Informed Consent: I have reviewed the patients History and Physical, chart, labs and discussed the procedure including the risks, benefits and alternatives for the proposed anesthesia with the patient or authorized representative who has indicated his/her understanding and acceptance.   Dental Advisory Given  Plan Discussed with: Anesthesiologist, CRNA and Surgeon  Anesthesia Plan  Comments:         Anesthesia Quick Evaluation  Anesthesia Physical Anesthesia Plan Anesthesia Quick Evaluation

## 2020-04-29 NOTE — Discharge Instructions (Addendum)
Bupivacaine Liposomal Suspension for Injection °What is this medicine? °BUPIVACAINE LIPOSOMAL (bue PIV a kane LIP oh som al) is an anesthetic. It causes loss of feeling in the skin or other tissues. It is used to prevent and to treat pain from some procedures. °This medicine may be used for other purposes; ask your health care provider or pharmacist if you have questions. °COMMON BRAND NAME(S): EXPAREL °What should I tell my health care provider before I take this medicine? °They need to know if you have any of these conditions: °· G6PD deficiency °· heart disease °· kidney disease °· liver disease °· low blood pressure °· lung or breathing disease, like asthma °· an unusual or allergic reaction to bupivacaine, other medicines, foods, dyes, or preservatives °· pregnant or trying to get pregnant °· breast-feeding °How should I use this medicine? °This medicine is for injection into the affected area. It is given by a health care professional in a hospital or clinic setting. °Talk to your pediatrician regarding the use of this medicine in children. Special care may be needed. °Overdosage: If you think you have taken too much of this medicine contact a poison control center or emergency room at once. °NOTE: This medicine is only for you. Do not share this medicine with others. °What if I miss a dose? °This does not apply. °What may interact with this medicine? °This medicine may interact with the following medications: °· acetaminophen °· certain antibiotics like dapsone, nitrofurantoin, aminosalicylic acid, sulfonamides °· certain medicines for seizures like phenobarbital, phenytoin, valproic acid °· chloroquine °· cyclophosphamide °· flutamide °· hydroxyurea °· ifosfamide °· metoclopramide °· nitric oxide °· nitroglycerin °· nitroprusside °· nitrous oxide °· other local anesthetics like lidocaine, pramoxine, tetracaine °· primaquine °· quinine °· rasburicase °· sulfasalazine °This list may not describe all possible  interactions. Give your health care provider a list of all the medicines, herbs, non-prescription drugs, or dietary supplements you use. Also tell them if you smoke, drink alcohol, or use illegal drugs. Some items may interact with your medicine. °What should I watch for while using this medicine? °Your condition will be monitored carefully while you are receiving this medicine. °Be careful to avoid injury while the area is numb, and you are not aware of pain. °What side effects may I notice from receiving this medicine? °Side effects that you should report to your doctor or health care professional as soon as possible: °· allergic reactions like skin rash, itching or hives, swelling of the face, lips, or tongue °· seizures °· signs and symptoms of a dangerous change in heartbeat or heart rhythm like chest pain; dizziness; fast, irregular heartbeat; palpitations; feeling faint or lightheaded; falls; breathing problems °· signs and symptoms of methemoglobinemia such as pale, gray, or blue colored skin; headache; fast heartbeat; shortness of breath; feeling faint or lightheaded, falls; tiredness °Side effects that usually do not require medical attention (report to your doctor or health care professional if they continue or are bothersome): °· anxious °· back pain °· changes in taste °· changes in vision °· constipation °· dizziness °· fever °· nausea, vomiting °This list may not describe all possible side effects. Call your doctor for medical advice about side effects. You may report side effects to FDA at 1-800-FDA-1088. °Where should I keep my medicine? °This drug is given in a hospital or clinic and will not be stored at home. °NOTE: This sheet is a summary. It may not cover all possible information. If you have questions about this   medicine, talk to your doctor, pharmacist, or health care provider. °© 2020 [employer] (2019-08-20 10:48:23) ° ° °AMBULATORY SURGERY  °DISCHARGE INSTRUCTIONS ° ° °1) The  drugs that you were given will stay in your system until tomorrow so for the next 24 hours you should not: ° °A) Drive an automobile °B) Make any legal decisions °C) Drink any alcoholic beverage ° ° °2) You may resume regular meals tomorrow.  Today it is better to start with liquids and gradually work up to solid foods. ° °You may eat anything you prefer, but it is better to start with liquids, then soup and crackers, and gradually work up to solid foods. ° ° °3) Please notify your doctor immediately if you have any unusual bleeding, trouble breathing, redness and pain at the surgery site, drainage, fever, or pain not relieved by medication. ° ° ° °4) Additional Instructions: ° ° ° ° ° ° ° °Please contact your physician with any problems or Same Day Surgery at [phone], Monday through Friday 6 am to 4 pm, or [facility] number at [phone]. °

## 2020-04-29 NOTE — Anesthesia Procedure Notes (Signed)
Procedure Name: Intubation Performed by: Shanay Woolman, CRNA Pre-anesthesia Checklist: Patient identified, Patient being monitored, Timeout performed, Emergency Drugs available and Suction available Patient Re-evaluated:Patient Re-evaluated prior to induction Oxygen Delivery Method: Circle system utilized Preoxygenation: Pre-oxygenation with 100% oxygen Induction Type: IV induction Ventilation: Mask ventilation without difficulty Laryngoscope Size: 3 and McGraph Grade View: Grade I Tube type: Oral Tube size: 7.0 mm Number of attempts: 1 Airway Equipment and Method: Stylet and Video-laryngoscopy Placement Confirmation: ETT inserted through vocal cords under direct vision,  positive ETCO2 and breath sounds checked- equal and bilateral Secured at: 21 cm Tube secured with: Tape Dental Injury: Teeth and Oropharynx as per pre-operative assessment        

## 2020-04-29 NOTE — Transfer of Care (Signed)
Immediate Anesthesia Transfer of Care Note  Patient: Andrea Webster  Procedure(s) Performed: HERNIA REPAIR VENTRAL ADULT (N/A Abdomen) EXCISION OF MESH (N/A Abdomen)  Patient Location: PACU  Anesthesia Type:General  Level of Consciousness: drowsy and patient cooperative  Airway & Oxygen Therapy: Patient Spontanous Breathing and Patient connected to face mask oxygen  Post-op Assessment: Report given to RN and Post -op Vital signs reviewed and stable  Post vital signs: Reviewed and stable  Last Vitals:  Vitals Value Taken Time  BP 149/76 04/29/20 1527  Temp 36.8 C 04/29/20 1527  Pulse 71 04/29/20 1531  Resp 20 04/29/20 1531  SpO2 99 % 04/29/20 1531  Vitals shown include unvalidated device data.  Last Pain:  Vitals:   04/29/20 1527  TempSrc:   PainSc: Asleep         Complications: No apparent anesthesia complications

## 2020-04-29 NOTE — Anesthesia Procedure Notes (Signed)
Anesthesia Regional Block: TAP block   Pre-Anesthetic Checklist: ,, timeout performed, Correct Patient, Correct Site, Correct Laterality, Correct Procedure, Correct Position, site marked, Risks and benefits discussed,  Surgical consent,  Pre-op evaluation,  At surgeon's request and post-op pain management  Laterality: Right  Prep: chloraprep       Needles:  Injection technique: Single-shot  Needle Type: Echogenic Needle     Needle Length: 9cm  Needle Gauge: 22     Additional Needles:   Procedures:,,,, ultrasound used (permanent image in chart),,,,  Narrative:  Start time: 04/29/2020 12:10 PM End time: 04/29/2020 12:16 PM Injection made incrementally with aspirations every 5 mL.  Performed by: Personally  Anesthesiologist: Martha Clan, MD  Additional Notes: Patient consented for risk and benefits of nerve block including but not limited to nerve damage, failed block, bleeding and infection.  Patient voiced understanding.  Functioning IV was confirmed and monitors were applied.  Timeout done prior to procedure and prior to any sedation being given to the patient.  Patient confirmed procedure site prior to any sedation given to the patient.  A 33mm 22ga Stimuplex needle was used. Sterile prep,hand hygiene and sterile gloves were used.  Minimal sedation used for procedure.  No paresthesia endorsed by patient during the procedure.  Negative aspiration and negative test dose prior to incremental administration of local anesthetic. The patient tolerated the procedure well with no immediate complications.

## 2020-04-29 NOTE — H&P (Signed)
Andrea Webster [id number] [birth year]     HPI:  64 year old woman developed a ventral hernia after 2017 cholecystectomy. Repaired with mesh in early 2018. Recurrent hernia including small bowel and transverse colon (on CT). Symptomatic.  Anemia noted on preop labs, and CT showed a rectal polyp confirmed on rigid sigmoidoscopy prior to presentation today. No bleeding.  Plan for colonoscopy after hernia repair.   Medications Prior to Admission  Medication Sig Dispense Refill Last Dose  . albuterol (VENTOLIN HFA) 108 (90 Base) MCG/ACT inhaler INHALE 2 PUFFS EVERY 6  HOURS AS NEEDED FOR WHEEZING OR SHORTNESS OF BREATH. (Patient taking differently: Inhale 2 puffs into the lungs every 6 (six) hours as needed for wheezing or shortness of breath. ) 54 g 1 Past Month at Unknown time  . ALPRAZolam (XANAX) 1 MG tablet Take 1 tablet (1 mg total) by mouth 3 (three) times daily as needed for anxiety. 90 tablet 2 04/29/2020 at Unknown time  . aspirin 81 MG EC tablet Take 81 mg by mouth daily.    Past Week at Unknown time  . atorvastatin (LIPITOR) 20 MG tablet TAKE 1 TABLET EVERY DAY  (Patient taking differently: Take 20 mg by mouth daily. ) 90 tablet 3 04/28/2020 at Unknown time  . Cholecalciferol (VITAMIN D) 50 MCG (2000 UT) CAPS Take 2,000 Units by mouth daily.    04/28/2020 at Unknown time  . clopidogrel (PLAVIX) 75 MG tablet Take 1 tablet (75 mg total) by mouth daily with breakfast. 90 tablet 1 04/24/2020 at Unknown time  . ezetimibe (ZETIA) 10 MG tablet Take 1 tablet (10 mg total) by mouth daily. 90 tablet 1 04/29/2020 at Unknown time  . fluticasone (FLONASE) 50 MCG/ACT nasal spray USE 2 SPRAYS IN EACH NOSTRIL ONE TIME DAILY 48 g 0 04/28/2020 at Unknown time  . isosorbide mononitrate (IMDUR) 60 MG 24 hr tablet TAKE 1 TABLET EVERY DAY (Patient taking differently: Take 60 mg by mouth daily. ) 90 tablet 1 04/29/2020 at Unknown time  . metoprolol tartrate (LOPRESSOR) 25 MG tablet TAKE 1 TABLET TWICE DAILY (Patient taking  differently: Take 25 mg by mouth 2 (two) times daily. ) 180 tablet 1 04/29/2020 at 0500  . nitroGLYCERIN (NITROSTAT) 0.4 MG SL tablet Place 1 tablet (0.4 mg total) under the tongue every 5 (five) minutes as needed for chest pain. 25 tablet 3 Past Month at Unknown time  . pantoprazole (PROTONIX) 40 MG tablet TAKE 1 TABLET EVERY DAY (Patient taking differently: Take 40 mg by mouth daily. ) 90 tablet 1 04/29/2020 at Unknown time  . senna (SENOKOT) 8.6 MG tablet Take 1 tablet by mouth at bedtime. Takes at night   04/28/2020 at Unknown time  . Wheat Dextrin (EQ FIBER POWDER PO) Take 1 application by mouth 2 (two) times daily.   04/28/2020 at Unknown time  . zolpidem (AMBIEN) 10 MG tablet Take 1 tablet (10 mg total) by mouth at bedtime as needed. for sleep (Patient taking differently: Take 10 mg by mouth at bedtime as needed for sleep. ) 90 tablet 1 04/28/2020 at Unknown time   Allergies  Allergen Reactions  . Adhesive [Tape] Other (See Comments)    Don't use flex/fabric bandaids Paper tape okay  . Paroxetine Hcl Other (See Comments)    Made her feel crazy and she would cry all the time  . Crestor [Rosuvastatin Calcium] Other (See Comments)    aches  . Effexor [Venlafaxine] Other (See Comments)    irritable  . Lipitor [Atorvastatin]  Other (See Comments)    Intolerant. Muscle pain.  Takes a lower dose and does okay  . Sertraline Hcl Other (See Comments)    Made her feel crazy  . Wellbutrin [Bupropion Hcl] Other (See Comments)    Make her feel "crazy"   Past Medical History:  Diagnosis Date  . Anxiety   . Arthritis   . Baker's cyst    x 2  . CAD (coronary artery disease)    a. s/p MI & PTCA - age 89;  b. 04/2015 PCI: LM nl, LAD 60p (FFR 0.83), D1 60ost, LCX 50p, 1m (2.75x15 Xience Alpine DES), OM1/2/3 min irregs, RCA 171m CTO; c. 08/2019 PCI: LM nl, LAD mod prox dzs, LCX patent stent w/ severe prox dzs (DES), RCA CTO mid.  . Complication of anesthesia   . Constipation    pretty severe, makes hernia  discomfort much worse.  . Depression    intolerant of paxil, wellbutrin, zoloft  . Diastolic dysfunction    a. 08/2019 Echo: EF 60-65%, gr1 DD, PASP 103mmHg.  Marland Kitchen GERD (gastroesophageal reflux disease)   . Hayfever   . headaches   . Heart murmur   . High cholesterol   . Hypertension   . Insomnia   . Myocardial infarction ([place]) 08/2019   stented. first MI at age 48  . Plantar fasciitis   . Rectal polyp 03/2020  . RLS (restless legs syndrome)   . Sciatica   . Sleep apnea    snores very loudly  . Smoker    a. quit 01/2014.   Past Surgical History:  Procedure Laterality Date  . ANGIOPLASTY  1997  . APPENDECTOMY  1978   open  . [place]  . CARDIAC CATHETERIZATION N/A 04/30/2015   Procedure: Left Heart Cath;  Surgeon: Wellington Hampshire, MD;  Location: [place] [facility] [facility];  Service: Cardiovascular;  Laterality: N/A;  . CARPAL TUNNEL RELEASE    . CESAREAN SECTION  1980  . CESAREAN SECTION  1984  . CHOLECYSTECTOMY N/A 03/30/2016   Procedure: LAPAROSCOPIC CHOLECYSTECTOMY WITH INTRAOPERATIVE CHOLANGIOGRAM;  Surgeon: Jules Husbands, MD;  Location: [facility];  Service: General;  Laterality: N/A;  . [place]  [facility]  . CORONARY STENT INTERVENTION N/A 09/05/2019   Procedure: CORONARY STENT INTERVENTION;  Surgeon: Isaias Cowman, MD;  Location: [place] [facility];  Service: Cardiovascular;  Laterality: N/A;  . HERNIA REPAIR  11/2016   [facility]; Dr. Gaynelle Arabian  . LEFT HEART CATH AND CORONARY ANGIOGRAPHY N/A 09/05/2019   Procedure: LEFT HEART CATH AND CORONARY ANGIOGRAPHY;  Surgeon: Minna Merritts, MD;  Location: [facility];  Service: Cardiovascular;  Laterality: N/A;  . VAGINAL HYSTERECTOMY     Social History   Socioeconomic History  . Marital status: Significant Other    Spouse name: TIM  . Number of  children: Not on file  . Years of education: Not on file  . Highest education level: Not on file  Occupational History  . 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 Smoking status: Former Smoker    Packs/day: 0.33    Years: 20.00    Pack years: 6.60    Types: Cigarettes    Quit date: 02/13/2015    Years since quitting:  5.2  . Smokeless tobacco: Never Used  Substance and Sexual Activity  . Alcohol use: Not Currently    Alcohol/week: 0.0 standard drinks  . Drug use: No  . Sexual activity: Not on file  Other Topics Concern  . Not on file  Social History Narrative   From [place], to [place] 2011   Widow as of 2001   1 son prev incarcerated, out as of 2019, infrequent contact as of 2019   1 daughter   Lives with significant other, TIM   Social Determinants of Health   Financial Resource Strain:   . Difficulty of Paying Living Expenses:   Food Insecurity:   . Worried About Charity fundraiser in the Last Year:   . Arboriculturist in the Last Year:   Transportation Needs:   . Film/video editor (Medical):   Marland Kitchen Lack of Transportation (Non-Medical):   Physical Activity:   . Days of Exercise per Week:   . Minutes of Exercise per Session:   Stress:   . Feeling of Stress :   Social Connections:   . Frequency of Communication with Friends and Family:   . Frequency of Social Gatherings with Friends and Family:   . Attends Religious Services:   . Active Member of Clubs or Organizations:   . Attends Archivist Meetings:   Marland Kitchen Marital Status:   Intimate Partner Violence:   . Fear of Current or Ex-Partner:   . Emotionally Abused:   Marland Kitchen Physically Abused:   . Sexually Abused:    Social History   Social History Narrative   From [place], to [place] 2011   Widow as of 2001   1 son prev incarcerated, out as of 2019, infrequent contact as of 2019   1 daughter   Lives with significant other, TIM     ROS:  Negative.     PE: HEENT: Negative. Lungs: Clear. Cardio: RR.  Assessment/Plan:  Proceed with planned ventral hernia repair.    Forest Gleason [facility] 04/29/2020

## 2020-04-29 NOTE — Op Note (Signed)
Preoperative diagnosis: Recurrent ventral hernia.  Postoperative diagnosis: Same.  Operative procedure: Resection of previously placed Ventralex mesh, component separation repair with Bard soft mesh in the retrorectus space.  Operating surgeon: Hervey Ard, MD.  Assistant: Arvilla Meres, RNFA.  Anesthesia: General endotracheal, tapp block.  Estimated blood loss: 30 cc.  Clinical note: This 64 year old woman went underwent laparoscopic cholecystectomy in 2017 and developed a hernia in the epigastric site above the umbilicus.  She underwent repair with a intraperitoneal placement of a Ventralex mesh in early 2018.  She is developed a recurrent hernia which is symptomatic.  The CT scan showed the small bowel and transverse colon within the hernia defect.  She admitted for elective repair.  She received Ancef prior to the procedure.  SCD stockings for DVT prevention.  A TAPP block was placed by anesthesia and well-tolerated.  Operative note: With the patient under adequate general endotracheal anesthesia the abdomen was cleansed with ChloraPrep and draped.  A midline incision was made beginning about 4 cm above the previous incision.  The skin was incised sharply remaining dissection completed with electrocautery.  The clear area above the old hernia site was carried down till the fascia.  The fascia was carefully opened followed by the peritoneum.  Multiple adhesions of the omentum to the anterior abdominal wall were identified.  Most were to the previously placed mesh.  These adhesions were taken down with sharp and cautery dissection.  The mesh was then resected from the intra-abdominal location on the undersurface of the fascia.  This was discarded as was a small pieces of omentum removed to minimize the risk of internal hernia.  There was no evidence of injury to the bowel.  The undersurface of the fascia was cleared circumferentially.  The retrorectus space was entered on each side and extended  to the lateral border of the rectus sheath.  The posterior rectus sheath was then closed with a running 2-0 Vicryl suture in 2 segments.  A 4 x 6 inch piece of Bard soft mesh was then smoothed into the retrorectus space.  This was anchored into position with 3 transabdominal sutures on each side of 0 Maxon.  This was snug across the repair site.  The midline fascia was then easily approximated with interrupted 0 Maxon figure-of-eight sutures, a small bite of the mesh taken with each stitch.  The area of the hernia sac had previously been excised.  The space created by the hernia was closed with a 2-0 Vicryl figure-of-eight suture.  The adipose layer was closed in a similar fashion.  The skin was closed with a running 4-0 Vicryl subcuticular suture.  Benzoin and Steri-Strips followed by a honeycomb dressing was applied to the midline wound.  Benzoin Steri-Strips were applied to the transfascial suture sites.  The patient was extubated deep and tolerated the procedure well.  She was taken to recovery in stable condition.

## 2020-04-29 NOTE — OR Nursing (Signed)
Per Dr. Bary Castilla tele ok to d/c pt to home without his visit to postop.  Also, told him pt advised that Dr. Fletcher Anon told her to resume Plavix tomorrow.  Per Dr. Bary Castilla, Dr. Fletcher Anon is aware that he is instructing pt not to resume for 5 days.  Pt. Notified of same.

## 2020-04-29 NOTE — Anesthesia Procedure Notes (Signed)
Anesthesia Regional Block: TAP block   Pre-Anesthetic Checklist: ,, timeout performed, Correct Patient, Correct Site, Correct Laterality, Correct Procedure, Correct Position, site marked, Risks and benefits discussed,  Surgical consent,  Pre-op evaluation,  At surgeon's request and post-op pain management  Laterality: Left  Prep: chloraprep       Needles:  Injection technique: Single-shot  Needle Type: Echogenic Needle     Needle Length: 9cm  Needle Gauge: 22     Additional Needles:   Procedures:,,,, ultrasound used (permanent image in chart),,,,  Narrative:  Start time: 04/29/2020 12:20 PM End time: 04/29/2020 12:24 PM Injection made incrementally with aspirations every 5 mL.  Performed by: Personally  Anesthesiologist: Martha Clan, MD  Additional Notes: Patient consented for risk and benefits of nerve block including but not limited to nerve damage, failed block, bleeding and infection.  Patient voiced understanding.  Functioning IV was confirmed and monitors were applied.  Timeout done prior to procedure and prior to any sedation being given to the patient.  Patient confirmed procedure site prior to any sedation given to the patient.  A 88mm 22ga Stimuplex needle was used. Sterile prep,hand hygiene and sterile gloves were used.  Minimal sedation used for procedure.  No paresthesia endorsed by patient during the procedure.  Negative aspiration and negative test dose prior to incremental administration of local anesthetic. The patient tolerated the procedure well with no immediate complications.

## 2020-04-30 ENCOUNTER — Encounter: Payer: Self-pay | Admitting: General Surgery

## 2020-05-06 ENCOUNTER — Other Ambulatory Visit: Payer: Self-pay | Admitting: Family Medicine

## 2020-05-11 ENCOUNTER — Other Ambulatory Visit: Payer: Self-pay | Admitting: Cardiovascular Disease

## 2020-05-12 ENCOUNTER — Other Ambulatory Visit: Payer: Self-pay | Admitting: *Deleted

## 2020-05-12 NOTE — Telephone Encounter (Signed)
Faxed refill request. Alprazolam Last office visit:  04/03/2020 Last Filled:     90 tablet 2 01/22/2020  Please advise.

## 2020-05-13 MED ORDER — ALPRAZOLAM 1 MG PO TABS: 1.0000 mg | ORAL_TABLET | Freq: Three times a day (TID) | ORAL | 2 refills | Status: DC | PRN

## 2020-05-13 NOTE — Telephone Encounter (Signed)
Sent. Thanks.   

## 2020-05-16 ENCOUNTER — Ambulatory Visit: Payer: Medicare HMO | Attending: Internal Medicine

## 2020-05-16 DIAGNOSIS — Z23 Encounter for immunization: Secondary | ICD-10-CM

## 2020-05-16 NOTE — Progress Notes (Signed)
   Covid-19 Vaccination Clinic  Name:  VEATRICE ECKSTEIN    MRN: [id number] DOB: [birth year]  05/16/2020  Ms. Reily was observed post Covid-19 immunization for 15 minutes without incident. She was provided with Vaccine Information Sheet and instruction to access the V-Safe system.   Ms. Teters was instructed to call 911 with any severe reactions post vaccine: Marland Kitchen Difficulty breathing  . Swelling of face and throat  . A fast heartbeat  . A bad rash all over body  . Dizziness and weakness   Immunizations Administered    Name Date Dose VIS Date Route   Pfizer COVID-19 Vaccine 05/16/2020 11:41 AM 0.3 mL 01/15/2019 Intramuscular   Manufacturer: [place]   Lot: [id number]   [place]: [id number]

## 2020-06-02 ENCOUNTER — Other Ambulatory Visit: Payer: Self-pay | Admitting: General Surgery

## 2020-06-02 DIAGNOSIS — D509 Iron deficiency anemia, unspecified: Secondary | ICD-10-CM

## 2020-06-06 ENCOUNTER — Ambulatory Visit: Payer: Medicare HMO | Attending: Internal Medicine

## 2020-06-06 DIAGNOSIS — Z23 Encounter for immunization: Secondary | ICD-10-CM

## 2020-06-06 NOTE — Progress Notes (Signed)
   Covid-19 Vaccination Clinic  Name:  Andrea Webster    MRN: [id number] DOB: 1956/09/12  06/06/2020  Andrea Webster was observed post Covid-19 immunization for 15 minutes without incident. She was provided with Vaccine Information Sheet and instruction to access the V-Safe system.   Andrea Webster was instructed to call 911 with any severe reactions post vaccine: Marland Kitchen Difficulty breathing  . Swelling of face and throat  . A fast heartbeat  . A bad rash all over body  . Dizziness and weakness   Immunizations Administered    Name Date Dose VIS Date Route   Pfizer COVID-19 Vaccine 06/06/2020 10:41 AM 0.3 mL 01/15/2019 Intramuscular   Manufacturer: [place]   Lot: [id number]   [place]: [id number]

## 2020-06-09 ENCOUNTER — Other Ambulatory Visit: Payer: Self-pay | Admitting: *Deleted

## 2020-06-09 NOTE — Telephone Encounter (Signed)
Faxed refill request. Alprazolam Last office visit:   04/03/2020 Last Filled:

## 2020-06-22 ENCOUNTER — Other Ambulatory Visit: Payer: Self-pay | Admitting: Family Medicine

## 2020-06-24 ENCOUNTER — Other Ambulatory Visit: Admission: RE | Admit: 2020-06-24 | Payer: Medicare HMO | Source: Ambulatory Visit

## 2020-06-25 ENCOUNTER — Other Ambulatory Visit: Payer: Self-pay

## 2020-06-25 ENCOUNTER — Other Ambulatory Visit: Payer: Self-pay | Admitting: Family Medicine

## 2020-06-25 ENCOUNTER — Other Ambulatory Visit
Admission: RE | Admit: 2020-06-25 | Discharge: 2020-06-25 | Disposition: A | Discharge status: Home / Self Care | Payer: Medicare HMO | Source: Ambulatory Visit | Attending: General Surgery | Admitting: General Surgery

## 2020-06-25 ENCOUNTER — Other Ambulatory Visit: Payer: Self-pay | Admitting: Cardiovascular Disease

## 2020-06-25 ENCOUNTER — Encounter: Payer: Self-pay | Admitting: General Surgery

## 2020-06-25 DIAGNOSIS — Z20822 Contact with and (suspected) exposure to covid-19: Secondary | ICD-10-CM | POA: Insufficient documentation

## 2020-06-25 DIAGNOSIS — Z01812 Encounter for preprocedural laboratory examination: Secondary | ICD-10-CM | POA: Insufficient documentation

## 2020-06-26 ENCOUNTER — Other Ambulatory Visit: Payer: Self-pay | Admitting: *Deleted

## 2020-06-26 ENCOUNTER — Ambulatory Visit: Payer: Medicare HMO | Admitting: Certified Registered"

## 2020-06-26 ENCOUNTER — Encounter: Payer: Self-pay | Admitting: General Surgery

## 2020-06-26 ENCOUNTER — Other Ambulatory Visit: Payer: Self-pay

## 2020-06-26 ENCOUNTER — Encounter
Admission: RE | Disposition: A | Discharge status: Home / Self Care | Payer: Self-pay | Source: Home / Self Care | Attending: General Surgery

## 2020-06-26 ENCOUNTER — Ambulatory Visit
Admission: RE | Admit: 2020-06-26 | Discharge: 2020-06-26 | Disposition: A | Discharge status: Home / Self Care | Payer: Medicare HMO | Attending: General Surgery | Admitting: General Surgery

## 2020-06-26 DIAGNOSIS — D649 Anemia, unspecified: Secondary | ICD-10-CM | POA: Diagnosis not present

## 2020-06-26 DIAGNOSIS — Z87891 Personal history of nicotine dependence: Secondary | ICD-10-CM | POA: Diagnosis not present

## 2020-06-26 DIAGNOSIS — F419 Anxiety disorder, unspecified: Secondary | ICD-10-CM | POA: Diagnosis not present

## 2020-06-26 DIAGNOSIS — I252 Old myocardial infarction: Secondary | ICD-10-CM | POA: Insufficient documentation

## 2020-06-26 DIAGNOSIS — I2510 Atherosclerotic heart disease of native coronary artery without angina pectoris: Secondary | ICD-10-CM | POA: Diagnosis not present

## 2020-06-26 DIAGNOSIS — Z79899 Other long term (current) drug therapy: Secondary | ICD-10-CM | POA: Insufficient documentation

## 2020-06-26 DIAGNOSIS — Z9049 Acquired absence of other specified parts of digestive tract: Secondary | ICD-10-CM | POA: Insufficient documentation

## 2020-06-26 DIAGNOSIS — G4700 Insomnia, unspecified: Secondary | ICD-10-CM | POA: Insufficient documentation

## 2020-06-26 DIAGNOSIS — F418 Other specified anxiety disorders: Secondary | ICD-10-CM | POA: Diagnosis not present

## 2020-06-26 DIAGNOSIS — K219 Gastro-esophageal reflux disease without esophagitis: Secondary | ICD-10-CM | POA: Diagnosis not present

## 2020-06-26 DIAGNOSIS — Z955 Presence of coronary angioplasty implant and graft: Secondary | ICD-10-CM | POA: Insufficient documentation

## 2020-06-26 DIAGNOSIS — Z888 Allergy status to other drugs, medicaments and biological substances status: Secondary | ICD-10-CM | POA: Insufficient documentation

## 2020-06-26 DIAGNOSIS — F329 Major depressive disorder, single episode, unspecified: Secondary | ICD-10-CM | POA: Diagnosis not present

## 2020-06-26 DIAGNOSIS — Z7982 Long term (current) use of aspirin: Secondary | ICD-10-CM | POA: Insufficient documentation

## 2020-06-26 DIAGNOSIS — D128 Benign neoplasm of rectum: Secondary | ICD-10-CM | POA: Insufficient documentation

## 2020-06-26 DIAGNOSIS — D509 Iron deficiency anemia, unspecified: Secondary | ICD-10-CM | POA: Insufficient documentation

## 2020-06-26 DIAGNOSIS — M1990 Unspecified osteoarthritis, unspecified site: Secondary | ICD-10-CM | POA: Diagnosis not present

## 2020-06-26 DIAGNOSIS — I10 Essential (primary) hypertension: Secondary | ICD-10-CM | POA: Diagnosis not present

## 2020-06-26 DIAGNOSIS — Z1211 Encounter for screening for malignant neoplasm of colon: Secondary | ICD-10-CM | POA: Diagnosis not present

## 2020-06-26 DIAGNOSIS — K635 Polyp of colon: Secondary | ICD-10-CM | POA: Diagnosis not present

## 2020-06-26 DIAGNOSIS — E785 Hyperlipidemia, unspecified: Secondary | ICD-10-CM | POA: Diagnosis not present

## 2020-06-26 DIAGNOSIS — E7800 Pure hypercholesterolemia, unspecified: Secondary | ICD-10-CM | POA: Insufficient documentation

## 2020-06-26 DIAGNOSIS — Z8719 Personal history of other diseases of the digestive system: Secondary | ICD-10-CM | POA: Diagnosis not present

## 2020-06-26 DIAGNOSIS — D122 Benign neoplasm of ascending colon: Secondary | ICD-10-CM | POA: Insufficient documentation

## 2020-06-26 DIAGNOSIS — G4730 Sleep apnea, unspecified: Secondary | ICD-10-CM | POA: Diagnosis not present

## 2020-06-26 DIAGNOSIS — Z86010 Personal history of colonic polyps: Secondary | ICD-10-CM | POA: Diagnosis not present

## 2020-06-26 HISTORY — PX: COLONOSCOPY WITH PROPOFOL: SHX5780

## 2020-06-26 HISTORY — PX: ESOPHAGOGASTRODUODENOSCOPY (EGD) WITH PROPOFOL: SHX5813

## 2020-06-26 HISTORY — DX: Endocarditis, valve unspecified: I38

## 2020-06-26 HISTORY — DX: Sciatica, unspecified side: M54.30

## 2020-06-26 HISTORY — DX: Incisional hernia without obstruction or gangrene: K43.2

## 2020-06-26 HISTORY — DX: Primary insomnia: F51.01

## 2020-06-26 LAB — SARS CORONAVIRUS 2 (TAT 6-24 HRS): SARS Coronavirus 2: NEGATIVE

## 2020-06-26 SURGERY — ESOPHAGOGASTRODUODENOSCOPY (EGD) WITH PROPOFOL
Anesthesia: General

## 2020-06-26 MED ORDER — LIDOCAINE HCL (PF) 2 % IJ SOLN
INTRAMUSCULAR | Status: AC
  Filled 2020-06-26: qty 5

## 2020-06-26 MED ORDER — EPHEDRINE 5 MG/ML INJ
INTRAVENOUS | Status: AC
  Filled 2020-06-26: qty 4

## 2020-06-26 MED ORDER — PROPOFOL 500 MG/50ML IV EMUL
INTRAVENOUS | Status: DC | PRN
  Administered 2020-06-26: 99 ug/kg/min via INTRAVENOUS

## 2020-06-26 MED ORDER — LIDOCAINE HCL (CARDIAC) PF 100 MG/5ML IV SOSY
PREFILLED_SYRINGE | INTRAVENOUS | Status: DC | PRN
  Administered 2020-06-26: 50 mg via INTRAVENOUS

## 2020-06-26 MED ORDER — SODIUM CHLORIDE 0.9 % IV SOLN
INTRAVENOUS | Status: DC
  Administered 2020-06-26: 20 mL/h via INTRAVENOUS

## 2020-06-26 MED ORDER — PROPOFOL 10 MG/ML IV BOLUS
INTRAVENOUS | Status: AC
  Filled 2020-06-26: qty 20

## 2020-06-26 MED ORDER — EPHEDRINE SULFATE 50 MG/ML IJ SOLN
INTRAMUSCULAR | Status: DC | PRN
  Administered 2020-06-26: 20 mg via INTRAVENOUS

## 2020-06-26 MED ORDER — PROPOFOL 500 MG/50ML IV EMUL
INTRAVENOUS | Status: AC
  Filled 2020-06-26: qty 50

## 2020-06-26 NOTE — Anesthesia Preprocedure Evaluation (Signed)
Anesthesia Evaluation  Patient identified by MRN, date of birth, ID band Patient awake    Reviewed: Allergy & Precautions, H&P , NPO status , Patient's Chart, lab work & pertinent test results, reviewed documented beta blocker date and time   History of Anesthesia Complications (+) PONV and history of anesthetic complications  Airway Mallampati: III  TM Distance: >3 FB Neck ROM: full    Dental no notable dental hx. (+) Partial Upper, Missing, Chipped, Caps   Pulmonary neg shortness of breath, neg sleep apnea, neg COPD, neg recent URI, former smoker,    Pulmonary exam normal breath sounds clear to auscultation       Cardiovascular Exercise Tolerance: Good hypertension, (-) angina+ CAD, + Past MI and + Cardiac Stents  (-) CABG Normal cardiovascular exam(-) dysrhythmias + Valvular Problems/Murmurs  Rhythm:regular Rate:Normal     Neuro/Psych neg Seizures PSYCHIATRIC DISORDERS Anxiety Depression  Neuromuscular disease    GI/Hepatic Neg liver ROS, GERD  ,  Endo/Other  neg diabetesMorbid obesity  Renal/GU negative Renal ROS  negative genitourinary   Musculoskeletal   Abdominal   Peds  Hematology negative hematology ROS (+)   Anesthesia Other Findings Past Medical History:   Arthritis                                                    headaches                                                    GERD (gastroesophageal reflux disease)                       Hayfever                                                     CAD (coronary artery disease)                                  Comment:a. s/p MI & PTCA;  b. Ex MV: basal inflat, mid               inflat, apical inf, apical lat ischemia;  c.               04/2015 PCI: LM nl, LAD 60p (FFR 0.83), D1               60ost, LCX 50p, 41m (2.75x15 Xience Alpine               DES), OM1/2/3 min irregs, RCA 1105m CTO.   High cholesterol                                              Sciatica  Smoker                                                         Comment:a. quit 01/2014.   RLS (restless legs syndrome)                                 Insomnia                                                     Anxiety                                                      Depression                                                     Comment:intolerant of paxil, wellbutrin, zoloft   Baker's cyst                                                   Comment:x 2   Heart murmur                                                 Hypertension                                                 Myocardial infarction ([place])                                  Reproductive/Obstetrics negative OB ROS                             Anesthesia Physical  Anesthesia Plan  ASA: III  Anesthesia Plan: General   Post-op Pain Management:  Regional for Post-op pain   Induction: Intravenous  PONV Risk Score and Plan: 4 or greater and Propofol infusion and TIVA  Airway Management Planned: Natural Airway and Nasal Cannula  Additional Equipment:   Intra-op Plan:   Post-operative Plan: Extubation in OR  Informed Consent: I have reviewed the patients History and Physical, chart, labs and discussed the procedure including the risks, benefits and alternatives for the proposed anesthesia with the patient or authorized representative who has indicated his/her understanding and acceptance.     Dental Advisory Given  Plan  Discussed with: Anesthesiologist, CRNA and Surgeon  Anesthesia Plan Comments:         Anesthesia Quick Evaluation

## 2020-06-26 NOTE — H&P (Signed)
Andrea Webster [id number] 07/31/1956     HPI:  64 year old woman noted with anemia prior to recent ventral hernia repair.  CT showed a suspected polyp in the rectum, confirmed on office sigmoidoscopy. For upper and lower endoscopy.   Medications Prior to Admission  Medication Sig Dispense Refill Last Dose  . albuterol (VENTOLIN HFA) 108 (90 Base) MCG/ACT inhaler INHALE 2 PUFFS EVERY 6  HOURS AS NEEDED FOR WHEEZING OR SHORTNESS OF BREATH. (Patient taking differently: Inhale 2 puffs into the lungs every 6 (six) hours as needed for wheezing or shortness of breath. ) 54 g 1 06/25/2020 at Unknown time  . ALPRAZolam (XANAX) 1 MG tablet Take 1 tablet (1 mg total) by mouth 3 (three) times daily as needed for anxiety. 90 tablet 2 06/25/2020 at Unknown time  . aspirin 81 MG EC tablet Take 81 mg by mouth daily.    06/26/2020 at 0500  . atorvastatin (LIPITOR) 20 MG tablet TAKE 1 TABLET EVERY DAY  (Patient taking differently: Take 20 mg by mouth daily. ) 90 tablet 3 06/25/2020 at Unknown time  . cetirizine (ZYRTEC) 10 MG tablet Take 10 mg by mouth daily.   06/25/2020 at Unknown time  . Cholecalciferol (VITAMIN D) 50 MCG (2000 UT) CAPS Take 2,000 Units by mouth daily.    Past Week at Unknown time  . clopidogrel (PLAVIX) 75 MG tablet TAKE 1 TABLET (75 MG TOTAL) BY MOUTH DAILY WITH BREAKFAST. 90 tablet 1 Past Week at Unknown time  . ezetimibe (ZETIA) 10 MG tablet TAKE 1 TABLET (10 MG TOTAL) BY MOUTH DAILY. 90 tablet 1 06/25/2020 at Unknown time  . fluticasone (FLONASE) 50 MCG/ACT nasal spray USE 2 SPRAYS IN EACH NOSTRIL ONE TIME DAILY 48 g 3 06/25/2020 at Unknown time  . isosorbide mononitrate (IMDUR) 60 MG 24 hr tablet TAKE 1 TABLET EVERY DAY (Patient taking differently: Take 60 mg by mouth daily. ) 90 tablet 1 06/26/2020 at 0500  . metoprolol tartrate (LOPRESSOR) 25 MG tablet TAKE 1 TABLET TWICE DAILY 180 tablet 1 06/26/2020 at 0500  . Multiple Vitamins-Minerals (MULTIVITAMIN WITH MINERALS) tablet Take 1 tablet by mouth daily.    Past Week at Unknown time  . nitroGLYCERIN (NITROSTAT) 0.4 MG SL tablet DISSOLVE 1 TABLET UNDER THE TONGUE EVERY 5 MINUTES AS NEEDED FOR CHEST PAIN 25 tablet 1 Past Month at Unknown time  . pantoprazole (PROTONIX) 40 MG tablet TAKE 1 TABLET EVERY DAY (Patient taking differently: Take 40 mg by mouth daily. ) 90 tablet 1 06/25/2020 at Unknown time  . senna (SENOKOT) 8.6 MG tablet Take 1 tablet by mouth at bedtime. Takes at night   06/25/2020 at Unknown time  . Wheat Dextrin (EQ FIBER POWDER PO) Take 1 application by mouth 2 (two) times daily.   06/25/2020 at Unknown time  . zolpidem (AMBIEN) 10 MG tablet Take 1 tablet (10 mg total) by mouth at bedtime as needed. for sleep (Patient taking differently: Take 10 mg by mouth at bedtime as needed for sleep. ) 90 tablet 1 06/25/2020 at Unknown time  . HYDROcodone-acetaminophen (NORCO/VICODIN) 5-325 MG tablet Take 1 tablet by mouth every 4 (four) hours as needed for moderate pain. (Patient not taking: Reported on 06/26/2020) 20 tablet 0 Not Taking at Unknown time   Allergies  Allergen Reactions  . Adhesive [Tape] Other (See Comments)    Don't use flex/fabric bandaids Paper tape okay  . Paroxetine Hcl Other (See Comments)    Made her feel crazy and she would cry  all the time  . Crestor [Rosuvastatin Calcium] Other (See Comments)    aches  . Effexor [Venlafaxine] Other (See Comments)    irritable  . Lipitor [Atorvastatin] Other (See Comments)    Intolerant. Muscle pain.  Takes a lower dose and does okay  . Sertraline Hcl Other (See Comments)    Made her feel crazy  . Wellbutrin [Bupropion Hcl] Other (See Comments)    Make her feel "crazy"   Past Medical History:  Diagnosis Date  . Anxiety   . Arthritis   . Baker's cyst    x 2  . CAD (coronary artery disease)    a. s/p MI & PTCA - age 64;  b. 04/2015 PCI: LM nl, LAD 60p (FFR 0.83), D1 60ost, LCX 50p, 33m (2.75x15 Xience Alpine DES), OM1/2/3 min irregs, RCA 159m CTO; c. 08/2019 PCI: LM nl, LAD mod prox  dzs, LCX patent stent w/ severe prox dzs (DES), RCA CTO mid.  . Chronic sciatica   . Complication of anesthesia   . Constipation    pretty severe, makes hernia discomfort much worse.  . Depression    intolerant of paxil, wellbutrin, zoloft  . Diastolic dysfunction    a. 08/2019 Echo: EF 60-65%, gr1 DD, PASP 88mmHg.  Marland Kitchen GERD (gastroesophageal reflux disease)   . Hayfever   . headaches   . Heart disease   . Heart murmur   . Heart valve disease   . High cholesterol   . Hypertension   . Incisional hernia   . Insomnia   . Myocardial infarction ([place]) 08/2019   stented. first MI at age 65  . Plantar fasciitis   . Primary insomnia   . Rectal polyp 03/2020  . RLS (restless legs syndrome)   . Sciatica   . Sleep apnea    snores very loudly  . Smoker    a. quit 01/2014.   Past Surgical History:  Procedure Laterality Date  . ANGIOPLASTY  1997  . APPENDECTOMY  1978   open  . [place]  . CARDIAC CATHETERIZATION N/A 04/30/2015   Procedure: Left Heart Cath;  Surgeon: Wellington Hampshire, MD;  Location: [facility];  Service: Cardiovascular;  Laterality: N/A;  . CARPAL TUNNEL RELEASE    . CESAREAN SECTION  1980  . CESAREAN SECTION  1984  . CHOLECYSTECTOMY N/A 03/30/2016   Procedure: LAPAROSCOPIC CHOLECYSTECTOMY WITH INTRAOPERATIVE CHOLANGIOGRAM;  Surgeon: Jules Husbands, MD;  Location: [facility];  Service: General;  Laterality: N/A;  . [place]  [facility]  . CORONARY STENT INTERVENTION N/A 09/05/2019   Procedure: CORONARY STENT INTERVENTION;  Surgeon: Isaias Cowman, MD;  Location: [facility];  Service: Cardiovascular;  Laterality: N/A;  . EXCISION OF MESH N/A 04/29/2020   Procedure: EXCISION OF MESH;  Surgeon: Robert Bellow, MD;  Location: [facility];  Service: General;  Laterality: N/A;  . HERNIA REPAIR   11/2016   [facility]; Dr. Gaynelle Arabian  . LEFT HEART CATH AND CORONARY ANGIOGRAPHY N/A 09/05/2019   Procedure: LEFT HEART CATH AND CORONARY ANGIOGRAPHY;  Surgeon: Minna Merritts, MD;  Location: [facility];  Service: Cardiovascular;  Laterality: N/A;  . VAGINAL HYSTERECTOMY    . VENTRAL HERNIA REPAIR N/A 04/29/2020   Procedure: HERNIA REPAIR VENTRAL ADULT;  Surgeon: Robert Bellow, MD;  Location: [facility];  Service: General;  Laterality: N/A;  Ventral hernia repair with component separation- General anesthesia and exparel block; Arvilla Meres, RNFA to assist   Social History   Socioeconomic History  . Marital status: Significant Other    Spouse name: TIM  . Number of children: Not on file  . Years of education: Not on file  . Highest education level: Not on file  Occupational History  . 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 Smoking status: Former Smoker    Packs/day: 0.33    Years: 20.00    Pack years: 6.60    Types: Cigarettes    Quit date: 02/13/2015    Years since quitting: 5.3  . Smokeless tobacco: Never Used  Vaping Use  . Vaping Use: Never used  Substance and Sexual Activity  . Alcohol use: Not Currently    Alcohol/week: 0.0 standard drinks  . Drug use: No  . Sexual activity: Not on file  Other Topics Concern  . Not on file  Social History Narrative   From [place], to [place] 2011   Widow as of 2001   1 son prev incarcerated, out as of 2019, infrequent contact as of 2019   1 daughter   Lives with significant other, TIM   Social Determinants of Health   Financial Resource Strain:   . Difficulty of Paying Living Expenses:   Food Insecurity:   . Worried About Charity fundraiser in the Last Year:   . Arboriculturist in the Last Year:   Transportation Needs:   . Film/video editor (Medical):   Marland Kitchen Lack of Transportation (Non-Medical):   Physical Activity:   . Days of Exercise per Week:   .  Minutes of Exercise per Session:   Stress:   . Feeling of Stress :   Social Connections:   . Frequency of Communication with Friends and Family:   . Frequency of Social Gatherings with Friends and Family:   . Attends Religious Services:   . Active Member of Clubs or Organizations:   . Attends Archivist Meetings:   Marland Kitchen Marital Status:   Intimate Partner Violence:   . Fear of Current or Ex-Partner:   . Emotionally Abused:   Marland Kitchen Physically Abused:   . Sexually Abused:    Social History   Social History Narrative   From [place], to [place] 2011   Widow as of 2001   1 son prev incarcerated, out as of 2019, infrequent contact as of 2019   1 daughter   Lives with significant other, TIM     ROS: Negative.     PE: HEENT: Negative. Lungs: Clear. Cardio: RR.  Assessment/Plan:  Proceed with planned endoscopy.  Forest [employer] 06/26/2020

## 2020-06-26 NOTE — Transfer of Care (Signed)
Immediate Anesthesia Transfer of Care Note  Patient: Andrea Webster  Procedure(s) Performed: ESOPHAGOGASTRODUODENOSCOPY (EGD) WITH PROPOFOL (N/A ) COLONOSCOPY WITH PROPOFOL (N/A )  Patient Location: PACU and Endoscopy Unit  Anesthesia Type:General  Level of Consciousness: awake  Airway & Oxygen Therapy: Patient Spontanous Breathing and Patient connected to face mask oxygen  Post-op Assessment: Report given to RN and Post -op Vital signs reviewed and stable  Post vital signs: Reviewed and stable  Last Vitals:  Vitals Value Taken Time  BP 93/48 06/26/20 0832  Temp 36 C 06/26/20 0826  Pulse 66 06/26/20 0832  Resp 23 06/26/20 0832  SpO2 99 % 06/26/20 0832  Vitals shown include unvalidated device data.  Last Pain:  Vitals:   06/26/20 0826  TempSrc: Temporal  PainSc:          Complications: No complications documented.

## 2020-06-26 NOTE — Op Note (Signed)
[facility] Gastroenterology Patient Name: Andrea Webster Procedure Date: 06/26/2020 7:29 AM MRN: [id number] Account #: [id number] Date of Birth: Oct 21, 1956 Admit Type: Outpatient Age: 64 Room: [facility] ENDO ROOM 1 Gender: Female Note Status: Finalized Procedure:             Upper GI endoscopy Indications:           Iron deficiency anemia Providers:             Robert Bellow, MD Referring MD:          Elveria Rising. Damita Dunnings, MD (Referring MD) Medicines:             Monitored Anesthesia Care Complications:         No immediate complications. Procedure:             Pre-Anesthesia Assessment:                        - Prior to the procedure, a History and Physical was                         performed, and patient medications, allergies and                         sensitivities were reviewed. The patient's tolerance                         of previous anesthesia was reviewed.                        - The risks and benefits of the procedure and the                         sedation options and risks were discussed with the                         patient. All questions were answered and informed                         consent was obtained.                        After obtaining informed consent, the endoscope was                         passed under direct vision. Throughout the procedure,                         the patient's blood pressure, pulse, and oxygen                         saturations were monitored continuously. The Endoscope                         was introduced through the mouth, and advanced to the                         second part of duodenum. The upper GI endoscopy was  accomplished without difficulty. The patient tolerated                         the procedure well. Findings:      The esophagus was normal.      The stomach was normal.      The examined duodenum was normal. Impression:            - Normal esophagus.                         - Normal stomach.                        - Normal examined duodenum.                        - No specimens collected. Recommendation:        - Perform a colonoscopy today. Procedure Code(s):     --- Professional ---                        [phone], Esophagogastroduodenoscopy, flexible,                         transoral; diagnostic, including collection of                         specimen(s) by brushing or washing, when performed                         (separate procedure) Diagnosis Code(s):     --- Professional ---                        D50.9, Iron deficiency anemia, unspecified CPT copyright 2019 American Medical Association. All rights reserved. The codes documented in this report are preliminary and upon coder review may  be revised to meet current compliance requirements. Robert Bellow, MD 06/26/2020 7:54:35 AM This report has been signed electronically. Number of Addenda: 0 Note Initiated On: 06/26/2020 7:29 AM Estimated Blood Loss:  Estimated blood loss: none.      [facility]

## 2020-06-26 NOTE — Anesthesia Postprocedure Evaluation (Signed)
Anesthesia Post Note  Patient: Andrea Webster  Procedure(s) Performed: ESOPHAGOGASTRODUODENOSCOPY (EGD) WITH PROPOFOL (N/A ) COLONOSCOPY WITH PROPOFOL (N/A )  Patient location during evaluation: Endoscopy Anesthesia Type: General Level of consciousness: awake and alert Pain management: pain level controlled Vital Signs Assessment: post-procedure vital signs reviewed and stable Respiratory status: spontaneous breathing, nonlabored ventilation, respiratory function stable and patient connected to nasal cannula oxygen Cardiovascular status: blood pressure returned to baseline and stable Postop Assessment: no apparent nausea or vomiting Anesthetic complications: no   No complications documented.   Last Vitals:  Vitals:   06/26/20 0904 06/26/20 0905  BP:  104/60  Pulse:    Resp: 18 16  Temp:    SpO2:      Last Pain:  Vitals:   06/26/20 0826  TempSrc: Temporal  PainSc:                  Martha Clan

## 2020-06-26 NOTE — Telephone Encounter (Signed)
Faxed refill request. . Alprazolam Last office visit:   04/03/2020 Last Filled:   To [facility] 90 tablet 2 05/13/2020  Please advise.   Faxed refill request.  Zolpidem Last office visit:   04/03/2020 Last Filled:   To [facility]  90 tablet 1 01/22/2020  Please advise.

## 2020-06-26 NOTE — Op Note (Signed)
[facility] Patient Name: Andrea Webster Procedure Date: 06/26/2020 7:27 AM MRN: [id number] Account #: [id number] Date of Birth: April 22, 1956 Admit Type: Outpatient Age: 64 Room: [facility] ENDO ROOM 1 Gender: Female Note Status: Finalized Procedure:             Colonoscopy Indications:           High risk colon cancer surveillance: Personal history                         of colonic polyps, Incidental - Iron deficiency anemia Providers:             Robert Bellow, MD Referring MD:          Elveria Rising. Damita Dunnings, MD (Referring MD) Medicines:             Monitored Anesthesia Care Complications:         No immediate complications. Procedure:             Pre-Anesthesia Assessment:                        - Prior to the procedure, a History and Physical was                         performed, and patient medications, allergies and                         sensitivities were reviewed. The patient's tolerance                         of previous anesthesia was reviewed.                        - The risks and benefits of the procedure and the                         sedation options and risks were discussed with the                         patient. All questions were answered and informed                         consent was obtained.                        After obtaining informed consent, the colonoscope was                         passed under direct vision. Throughout the procedure,                         the patient's blood pressure, pulse, and oxygen                         saturations were monitored continuously. The                         Colonoscope was introduced through the anus and  advanced to the the cecum, identified by appendiceal                         orifice and ileocecal valve. The colonoscopy was                         performed without difficulty. The patient tolerated                         the procedure well. The  quality of the bowel                         preparation was excellent. Findings:      A 5 mm polyp was found in the mid ascending colon. The polyp was       sessile. The polyp was removed with a hot snare. Resection and retrieval       were complete.      A 16 mm polyp was found in the rectum. The polyp was pedunculated. The       polyp was removed with a hot snare. Resection and retrieval were       complete. The base was excised with a second pass of the loop to assure       complete removal. To prevent bleeding post-intervention, one hemostatic       clip was successfully placed (MR conditional). There was no bleeding at       the end of the procedure.      The retroflexed view of the distal rectum and anal verge was normal and       showed no anal or rectal abnormalities. Impression:            - One 5 mm polyp in the mid ascending colon, removed                         with a hot snare. Resected and retrieved.                        - One 16 mm polyp in the rectum, removed with a hot                         snare. Resected and retrieved. Clip (MR conditional)                         was placed.                        - The distal rectum and anal verge are normal on                         retroflexion view. Recommendation:        - Telephone endoscopist for pathology results in 1                         week. Procedure Code(s):     --- Professional ---                        [phone], Colonoscopy, flexible; with removal of  tumor(s), polyp(s), or other lesion(s) by snare                         technique Diagnosis Code(s):     --- Professional ---                        Z86.010, Personal history of colonic polyps                        K63.5, Polyp of colon                        K62.1, Rectal polyp CPT copyright 2019 American Medical Association. All rights reserved. The codes documented in this report are preliminary and upon coder review may  be revised  to meet current compliance requirements. Robert Bellow, MD 06/26/2020 8:25:17 AM This report has been signed electronically. Number of Addenda: 0 Note Initiated On: 06/26/2020 7:27 AM Scope Withdrawal Time: 0 hours 14 minutes 55 seconds  Total Procedure Duration: 0 hours 25 minutes 2 seconds  Estimated Blood Loss:  Estimated blood loss: none.      [facility]

## 2020-06-27 NOTE — Progress Notes (Signed)
   06/26/20 0730  Clinical Encounter Type  Visited With Family  Visit Type Initial  Referral From Chaplain  Consult/Referral To Chaplain  While rounding SDS waiting area, chaplain briefly visited with patient's family to make sure they were fine while waiting. Patient's family member said they were fine and chaplain wished them well.

## 2020-06-28 MED ORDER — ZOLPIDEM TARTRATE 10 MG PO TABS: 10.0000 mg | ORAL_TABLET | Freq: Every evening | ORAL | 1 refills | Status: DC | PRN

## 2020-06-28 MED ORDER — ALPRAZOLAM 1 MG PO TABS: 1.0000 mg | ORAL_TABLET | Freq: Three times a day (TID) | ORAL | 2 refills | Status: DC | PRN

## 2020-06-28 NOTE — Telephone Encounter (Signed)
Sent. Thanks.   

## 2020-06-29 ENCOUNTER — Encounter: Payer: Self-pay | Admitting: General Surgery

## 2020-06-29 LAB — SURGICAL PATHOLOGY

## 2020-07-02 ENCOUNTER — Other Ambulatory Visit: Payer: Self-pay | Admitting: *Deleted

## 2020-07-28 ENCOUNTER — Other Ambulatory Visit: Payer: Self-pay | Admitting: Family Medicine

## 2020-07-29 NOTE — Telephone Encounter (Signed)
Refill request Albuterol Last office visit 04/03/20 Last refill 04/03/20   54 G-1 refill

## 2020-07-29 NOTE — Telephone Encounter (Signed)
Sent. Thanks.   

## 2020-07-30 ENCOUNTER — Ambulatory Visit (INDEPENDENT_AMBULATORY_CARE_PROVIDER_SITE_OTHER): Payer: Medicare HMO

## 2020-07-30 ENCOUNTER — Other Ambulatory Visit: Payer: Self-pay

## 2020-07-30 DIAGNOSIS — Z23 Encounter for immunization: Secondary | ICD-10-CM | POA: Diagnosis not present

## 2020-08-15 ENCOUNTER — Other Ambulatory Visit: Payer: Self-pay | Admitting: Family Medicine

## 2020-08-15 ENCOUNTER — Other Ambulatory Visit: Payer: Self-pay | Admitting: Cardiovascular Disease

## 2020-09-02 ENCOUNTER — Other Ambulatory Visit: Payer: Self-pay | Admitting: Family Medicine

## 2020-11-06 ENCOUNTER — Other Ambulatory Visit: Payer: Self-pay | Admitting: Family Medicine

## 2020-11-16 ENCOUNTER — Other Ambulatory Visit: Payer: Self-pay | Admitting: Family Medicine

## 2020-11-17 NOTE — Telephone Encounter (Signed)
Please Advise

## 2020-11-17 NOTE — Telephone Encounter (Signed)
Sent. Thanks.   

## 2020-11-18 ENCOUNTER — Other Ambulatory Visit: Payer: Self-pay | Admitting: Family Medicine

## 2020-11-19 NOTE — Telephone Encounter (Signed)
Pharmacy requests refill on: Albuterol Sulfate HFA 108 mcg/act   LAST REFILL: 07/29/2020  LAST OV: 04/03/2020 NEXT OV: Not Scheduled  PHARMACY: [facility] [place], [place]

## 2020-12-09 ENCOUNTER — Other Ambulatory Visit: Payer: Self-pay | Admitting: Family Medicine

## 2020-12-10 NOTE — Telephone Encounter (Signed)
Pharmacy requests refill on: Isosorbide Mononitrate 60 mg 24 hr   LAST REFILL: 09/03/2020 (Q-90, R-0)  LAST OV: 04/03/2020 NEXT OV: Not Scheduled  PHARMACY: [facility]

## 2021-01-12 ENCOUNTER — Other Ambulatory Visit: Payer: Self-pay | Admitting: Family Medicine

## 2021-01-14 ENCOUNTER — Telehealth: Payer: Self-pay

## 2021-01-14 MED ORDER — ATORVASTATIN CALCIUM 20 MG PO TABS: 20.0000 mg | ORAL_TABLET | Freq: Every day | ORAL | 0 refills | Status: DC

## 2021-01-14 NOTE — Telephone Encounter (Signed)
Patient needs refills on Alprazolam 1 mg tablets and Ambien 10 mg tablets. Send both rxs to [facility].  LOV - 04/03/20 Next OV- not scheduled yet; called and left message to schedule future appt. Last refilled Alprazolam 11/17/20 #90/2, Ambien 06/28/20 #90/1

## 2021-01-15 MED ORDER — ALPRAZOLAM 1 MG PO TABS: 1.0000 mg | ORAL_TABLET | Freq: Three times a day (TID) | ORAL | 2 refills | Status: DC | PRN

## 2021-01-15 MED ORDER — ZOLPIDEM TARTRATE 10 MG PO TABS: 10.0000 mg | ORAL_TABLET | Freq: Every evening | ORAL | 1 refills | Status: DC | PRN

## 2021-01-15 NOTE — Telephone Encounter (Signed)
Sent. Thanks.   

## 2021-01-15 NOTE — Addendum Note (Signed)
Addended by: Tonia Ghent on: 01/15/2021 05:53 AM   Modules accepted: Orders

## 2021-02-07 ENCOUNTER — Other Ambulatory Visit: Payer: Self-pay | Admitting: Family Medicine

## 2021-02-07 DIAGNOSIS — I10 Essential (primary) hypertension: Secondary | ICD-10-CM

## 2021-02-07 DIAGNOSIS — D649 Anemia, unspecified: Secondary | ICD-10-CM

## 2021-02-09 ENCOUNTER — Encounter: Payer: Self-pay | Admitting: Cardiovascular Disease

## 2021-02-09 ENCOUNTER — Ambulatory Visit: Payer: Medicare HMO | Admitting: Cardiovascular Disease

## 2021-02-09 ENCOUNTER — Other Ambulatory Visit: Payer: Self-pay

## 2021-02-09 VITALS — BP 130/70 | HR 68 | Ht 59.5 in | Wt 212.2 lb

## 2021-02-09 DIAGNOSIS — I10 Essential (primary) hypertension: Secondary | ICD-10-CM

## 2021-02-09 DIAGNOSIS — I25118 Atherosclerotic heart disease of native coronary artery with other forms of angina pectoris: Secondary | ICD-10-CM

## 2021-02-09 DIAGNOSIS — E785 Hyperlipidemia, unspecified: Secondary | ICD-10-CM | POA: Diagnosis not present

## 2021-02-09 DIAGNOSIS — Z6841 Body Mass Index (BMI) 40.0 and over, adult: Secondary | ICD-10-CM | POA: Diagnosis not present

## 2021-02-09 NOTE — Patient Instructions (Signed)
Medication Instructions:  - Your physician has recommended you make the following change in your medication:   1) STOP plavix  *If you need a refill on your cardiac medications before your next appointment, please call your pharmacy*   Lab Work: - none ordered  If you have labs (blood work) drawn today and your tests are completely normal, you will receive your results only by: Marland Kitchen MyChart Message (if you have MyChart) OR . A paper copy in the mail If you have any lab test that is abnormal or we need to change your treatment, we will call you to review the results.   Testing/Procedures: - none ordered   Follow-Up: At [facility], you and your health needs are our priority.  As part of our continuing mission to provide you with exceptional heart care, we have created designated Provider Care Teams.  These Care Teams include your primary Cardiologist (physician) and Advanced Practice Providers (APPs -  Physician Assistants and Nurse Practitioners) who all work together to provide you with the care you need, when you need it.  We recommend signing up for the patient portal called "MyChart".  Sign up information is provided on this After Visit Summary.  MyChart is used to connect with patients for Virtual Visits (Telemedicine).  Patients are able to view lab/test results, encounter notes, upcoming appointments, etc.  Non-urgent messages can be sent to your provider as well.   To learn more about what you can do with MyChart, go to [email or web address].    Your next appointment:   6 month(s)  The format for your next appointment:   In Person  Provider:   You may see Kathlyn Sacramento, MD or one of the following Advanced Practice Providers on your designated Care Team:    Murray Hodgkins, NP  Christell Faith, PA-C  Marrianne Mood, PA-C  Cadence Pinecrest, [place]  Laurann Montana, NP    Other Instructions

## 2021-02-09 NOTE — Progress Notes (Signed)
Cardiology Office Note   Date:  02/09/2021   ID:  [name], DOB April 28, 1956, MRN [id number]  PCP:  Tonia Ghent, MD  Cardiologist:   Kathlyn Sacramento, MD   Chief Complaint  Patient presents with  . Other    6 month f/u no complaints today. Meds reviewed verbally with pt.      History of Present Illness: Andrea Webster is a 65 y.o. female who presents for a follow-up visit regarding coronary artery disease. She has known history of CAD s/p MI at the age of 73, with PTCA performed to unknown vessel.She has a strong family history for CAD and also smoked until March 2016. Previous cardiac catheterization in June 2016 showed a chronic total occlusion of the RCA, severe mid LCx disease with a 90% stenosis, and moderate proximal LAD disease.Pressure wire analysis was performed within the LAD stenosis and her fractional flow reserve was found to be not significant at 0.83, thus no PCI was performed within the LAD.The LCx was felt to be the culprit vessel and this was successfully stented using a 2.75 x 15 mm Xience Alpine DES. She has chronic medical conditions including GERD, previous cholecystectomy, hyperlipidemia, essential hypertension and obesity. She was hospitalized in October 2020 with unstable angina.  Echocardiogram showed an EF of 60 to 65% with grade 1 diastolic dysfunction and mild to moderate pulmonary hypertension with peak systolic pressure 45 mmHg. Cardiac catheterization  showed chronically occluded mid RCA, moderate proximal LAD disease, patent left circumflex stent with severe proximal stenosis and moderate distal disease.  She underwent successful PCI and drug-eluting stent placement to the proximal left circumflex. She underwent ventral hernia repair last year.  She has been doing well with no recent chest pain, shortness of breath or palpitations.   Past Medical History:  Diagnosis Date  . Anxiety   . Arthritis   . Baker's cyst    x 2  . CAD  (coronary artery disease)    a. s/p MI & PTCA - age 81;  b. 04/2015 PCI: LM nl, LAD 60p (FFR 0.83), D1 60ost, LCX 50p, 50m (2.75x15 Xience Alpine DES), OM1/2/3 min irregs, RCA 129m CTO; c. 08/2019 PCI: LM nl, LAD mod prox dzs, LCX patent stent w/ severe prox dzs (DES), RCA CTO mid.  . Chronic sciatica   . Complication of anesthesia   . Constipation    pretty severe, makes hernia discomfort much worse.  . Depression    intolerant of paxil, wellbutrin, zoloft  . Diastolic dysfunction    a. 08/2019 Echo: EF 60-65%, gr1 DD, PASP 5mmHg.  Marland Kitchen GERD (gastroesophageal reflux disease)   . Hayfever   . headaches   . Heart disease   . Heart murmur   . Heart valve disease   . High cholesterol   . Hypertension   . Incisional hernia   . Insomnia   . Myocardial infarction ([place]) 08/2019   stented. first MI at age 39  . Plantar fasciitis   . Primary insomnia   . Rectal polyp 03/2020  . RLS (restless legs syndrome)   . Sciatica   . Sleep apnea    snores very loudly  . Smoker    a. quit 01/2014.    Past Surgical History:  Procedure Laterality Date  . ANGIOPLASTY  1997  . APPENDECTOMY  1978   open  . [place]  . CARDIAC CATHETERIZATION N/A 04/30/2015   Procedure: Left  Heart Cath;  Surgeon: Wellington Hampshire, MD;  Location: [place] [facility];  Service: Cardiovascular;  Laterality: N/A;  . CARPAL TUNNEL RELEASE    . CESAREAN SECTION  1980  . CESAREAN SECTION  1984  . CHOLECYSTECTOMY N/A 03/30/2016   Procedure: LAPAROSCOPIC CHOLECYSTECTOMY WITH INTRAOPERATIVE CHOLANGIOGRAM;  Surgeon: Jules Husbands, MD;  Location: [facility] ORS;  Service: General;  Laterality: N/A;  . COLONOSCOPY WITH PROPOFOL N/A 06/26/2020   Procedure: COLONOSCOPY WITH PROPOFOL;  Surgeon: Robert Bellow, MD;  Location: [facility] ENDOSCOPY;  Service: Endoscopy;  Laterality: N/A;  . [place] WITH STENT PLACEMENT   [facility]  . CORONARY STENT INTERVENTION N/A 09/05/2019   Procedure: CORONARY STENT INTERVENTION;  Surgeon: Isaias Cowman, MD;  Location: [place] CV LAB;  Service: Cardiovascular;  Laterality: N/A;  . ESOPHAGOGASTRODUODENOSCOPY (EGD) WITH PROPOFOL N/A 06/26/2020   Procedure: ESOPHAGOGASTRODUODENOSCOPY (EGD) WITH PROPOFOL;  Surgeon: Robert Bellow, MD;  Location: [facility] ENDOSCOPY;  Service: Endoscopy;  Laterality: N/A;  . EXCISION OF MESH N/A 04/29/2020   Procedure: EXCISION OF MESH;  Surgeon: Robert Bellow, MD;  Location: [facility] ORS;  Service: General;  Laterality: N/A;  . HERNIA REPAIR  11/2016   [facility]; Dr. Gaynelle Arabian  . LEFT HEART CATH AND CORONARY ANGIOGRAPHY N/A 09/05/2019   Procedure: LEFT HEART CATH AND CORONARY ANGIOGRAPHY;  Surgeon: Minna Merritts, MD;  Location: [place] CV LAB;  Service: Cardiovascular;  Laterality: N/A;  . VAGINAL HYSTERECTOMY    . VENTRAL HERNIA REPAIR N/A 04/29/2020   Procedure: HERNIA REPAIR VENTRAL ADULT;  Surgeon: Robert Bellow, MD;  Location: [facility] ORS;  Service: General;  Laterality: N/A;  Ventral hernia repair with component separation- General anesthesia and exparel block; Arvilla Meres, RNFA to assist     Current Outpatient Medications  Medication Sig Dispense Refill  . albuterol (VENTOLIN HFA) 108 (90 Base) MCG/ACT inhaler INHALE 2 PUFFS EVERY 6  HOURS AS NEEDED FOR WHEEZING OR SHORTNESS OF BREATH. 3 each 0  . ALPRAZolam (XANAX) 1 MG tablet Take 1 tablet (1 mg total) by mouth 3 (three) times daily as needed. for anxiety 90 tablet 2  . aspirin 81 MG EC tablet Take 81 mg by mouth daily.     Marland Kitchen atorvastatin (LIPITOR) 20 MG tablet Take 1 tablet (20 mg total) by mouth daily. 90 tablet 0  . cetirizine (ZYRTEC) 10 MG tablet Take 10 mg by mouth daily.    . Cholecalciferol (VITAMIN D) 50 MCG (2000 UT) CAPS Take 2,000 Units by mouth daily.     . clopidogrel (PLAVIX) 75 MG tablet TAKE 1 TABLET (75 MG TOTAL) BY MOUTH  DAILY WITH BREAKFAST. 90 tablet 1  . ezetimibe (ZETIA) 10 MG tablet TAKE 1 TABLET EVERY DAY 90 tablet 1  . fluticasone (FLONASE) 50 MCG/ACT nasal spray USE 2 SPRAYS IN EACH NOSTRIL ONE TIME DAILY 48 g 3  . isosorbide mononitrate (IMDUR) 60 MG 24 hr tablet TAKE 1 TABLET EVERY DAY (NEED MD APPOINTMENT FOR REFILLS) 90 tablet 0  . metoprolol tartrate (LOPRESSOR) 25 MG tablet TAKE 1 TABLET TWICE DAILY 180 tablet 1  . Multiple Vitamins-Minerals (MULTIVITAMIN WITH MINERALS) tablet Take 1 tablet by mouth daily.    . nitroGLYCERIN (NITROSTAT) 0.4 MG SL tablet DISSOLVE 1 TABLET UNDER THE TONGUE EVERY 5 MINUTES AS NEEDED FOR CHEST PAIN 25 tablet 0  . pantoprazole (PROTONIX) 40 MG tablet TAKE 1  TABLET EVERY DAY (NEED MD APPOINTMENT FOR REFILLS) 90 tablet 0  . senna (SENOKOT) 8.6 MG tablet Take 1 tablet by mouth at bedtime. Takes at night    . zolpidem (AMBIEN) 10 MG tablet Take 1 tablet (10 mg total) by mouth at bedtime as needed. for sleep 90 tablet 1   No current facility-administered medications for this visit.    Allergies:   Adhesive [tape], Paroxetine hcl, Other, Crestor [rosuvastatin calcium], Effexor [venlafaxine], Lipitor [atorvastatin], Sertraline hcl, and Wellbutrin [bupropion hcl]    Social History:  The patient  reports that she quit smoking about 5 years ago. Her smoking use included cigarettes. She has a 6.60 pack-year smoking history. She has never used smokeless tobacco. She reports previous alcohol use. She reports that she does not use drugs.   Family History:  The patient's family history includes Alcohol abuse in her father; Bladder Cancer in her maternal grandmother; Breast cancer in her maternal grandmother; Colon cancer in her maternal grandmother; Coronary artery disease in her father and mother; Diabetes in her brother and mother; Heart attack in her father and mother; Heart disease in her father and mother; Hypertension in her brother and mother; Kidney cancer in her maternal  grandmother.    ROS:  Please see the history of present illness.   Otherwise, review of systems are positive for none.   All other systems are reviewed and negative.    PHYSICAL EXAM: VS:  BP 130/70 (BP Location: Left Arm, Patient Position: Sitting, Cuff Size: Normal)   Pulse 68   Ht 4' 11.5" (1.511 m)   Wt 212 lb 4 oz (96.3 kg)   SpO2 98%   BMI 42.15 kg/m  , BMI Body mass index is 42.15 kg/m. GEN: Well nourished, well developed, in no acute distress  HEENT: normal  Neck: no JVD, carotid bruits, or masses Cardiac: RRR; no  rubs, or gallops,no edema . 2/6 systolic ejection murmur in the aortic area which is early peaking Respiratory:  clear to auscultation bilaterally, normal work of breathing GI: soft, nontender, nondistended, + BS MS: no deformity or atrophy  Skin: warm and dry, no rash Neuro:  Strength and sensation are intact Psych: euthymic mood, full affect   EKG:  EKG is ordered today. EKG showed normal sinus rhythm with evidence of old inferior posterior infarct.   Recent Labs: 04/27/2020: BUN 9; Creatinine, Ser 0.93; Hemoglobin 10.9; Platelets 295; Potassium 4.2; Sodium 137    Lipid Panel    Component Value Date/Time   CHOL 124 09/05/2019 0340   CHOL 206 (H) 11/16/2016 1456   TRIG 130 09/05/2019 0340   HDL 32 (L) 09/05/2019 0340   HDL 54 11/16/2016 1456   CHOLHDL 3.9 09/05/2019 0340   VLDL 26 09/05/2019 0340   LDLCALC 66 09/05/2019 0340   LDLCALC 118 (H) 11/16/2016 1456      Wt Readings from Last 3 Encounters:  02/09/21 212 lb 4 oz (96.3 kg)  06/26/20 195 lb (88.5 kg)  04/29/20 198 lb (89.8 kg)         ASSESSMENT AND PLAN:   1.  Coronary artery disease involving native coronary arteries with other forms of angina.: She is doing well with no anginal symptoms on metoprolol and Imdur..  She is status post stent placement to the left circumflex twice.  She has chronically occluded RCA with collaterals and moderate LAD disease.  She requires aggressive  treatment of risk factors.  I elected to discontinue clopidogrel today and continue aspirin.  2.  Hyperlipidemia: She did not tolerate rosuvastatin in the past.  High-dose atorvastatin was associated with abnormal liver function test.  She is currently on 20 mg daily and Zetia.  She is going for her annual labs in the near future.  3.  Essential hypertension: Blood pressure is controlled on current medications.  4. Aortic sclerosis murmur: Echocardiogram in October of 2020 showed no significant aortic stenosis.   Disposition:   FU with me in 6 months  Signed,  Kathlyn Sacramento, MD  02/09/2021 2:56 PM    [place]

## 2021-02-10 ENCOUNTER — Other Ambulatory Visit: Payer: Self-pay | Admitting: Family Medicine

## 2021-02-15 ENCOUNTER — Other Ambulatory Visit: Payer: Self-pay

## 2021-02-15 ENCOUNTER — Other Ambulatory Visit (INDEPENDENT_AMBULATORY_CARE_PROVIDER_SITE_OTHER): Payer: Medicare HMO

## 2021-02-15 ENCOUNTER — Other Ambulatory Visit: Payer: Self-pay | Admitting: Family Medicine

## 2021-02-15 DIAGNOSIS — D649 Anemia, unspecified: Secondary | ICD-10-CM | POA: Diagnosis not present

## 2021-02-15 DIAGNOSIS — I10 Essential (primary) hypertension: Secondary | ICD-10-CM | POA: Diagnosis not present

## 2021-02-15 LAB — CBC WITH DIFFERENTIAL/PLATELET
Basophils Absolute: 0 10*3/uL (ref 0.0–0.1)
Basophils Relative: 0.5 % (ref 0.0–3.0)
Eosinophils Absolute: 0.2 10*3/uL (ref 0.0–0.7)
Eosinophils Relative: 2.3 % (ref 0.0–5.0)
HCT: 30.3 % — ABNORMAL LOW (ref 36.0–46.0)
Hemoglobin: 9.6 g/dL — ABNORMAL LOW (ref 12.0–15.0)
Lymphocytes Relative: 23.1 % (ref 12.0–46.0)
Lymphs Abs: 1.9 10*3/uL (ref 0.7–4.0)
MCHC: 31.6 g/dL (ref 30.0–36.0)
MCV: 69.3 fl — ABNORMAL LOW (ref 78.0–100.0)
Monocytes Absolute: 0.7 10*3/uL (ref 0.1–1.0)
Monocytes Relative: 8.1 % (ref 3.0–12.0)
Neutro Abs: 5.4 10*3/uL (ref 1.4–7.7)
Neutrophils Relative %: 66 % (ref 43.0–77.0)
Platelets: 347 10*3/uL (ref 150.0–400.0)
RBC: 4.38 Mil/uL (ref 3.87–5.11)
RDW: 18.5 % — ABNORMAL HIGH (ref 11.5–15.5)
WBC: 8.2 10*3/uL (ref 4.0–10.5)

## 2021-02-15 LAB — COMPREHENSIVE METABOLIC PANEL
ALT: 13 U/L (ref 0–35)
AST: 16 U/L (ref 0–37)
Albumin: 4.3 g/dL (ref 3.5–5.2)
Alkaline Phosphatase: 136 U/L — ABNORMAL HIGH (ref 39–117)
BUN: 9 mg/dL (ref 6–23)
CO2: 27 mEq/L (ref 19–32)
Calcium: 9.2 mg/dL (ref 8.4–10.5)
Chloride: 101 mEq/L (ref 96–112)
Creatinine, Ser: 0.8 mg/dL (ref 0.40–1.20)
GFR: 78.42 mL/min (ref >60.00)
Glucose, Bld: 123 mg/dL — ABNORMAL HIGH (ref 70–99)
Potassium: 4.9 mEq/L (ref 3.5–5.1)
Sodium: 136 mEq/L (ref 135–145)
Total Bilirubin: 0.8 mg/dL (ref 0.2–1.2)
Total Protein: 7.4 g/dL (ref 6.0–8.3)

## 2021-02-15 LAB — LIPID PANEL
Cholesterol: 160 mg/dL (ref 0–200)
HDL: 60 mg/dL (ref >39.00)
LDL Cholesterol: 77 mg/dL (ref 0–99)
NonHDL: 99.86
Total CHOL/HDL Ratio: 3
Triglycerides: 113 mg/dL (ref 0.0–149.0)
VLDL: 22.6 mg/dL (ref 0.0–40.0)

## 2021-02-15 LAB — IRON: Iron: 29 ug/dL — ABNORMAL LOW (ref 42–145)

## 2021-02-16 ENCOUNTER — Other Ambulatory Visit: Payer: Self-pay

## 2021-02-16 MED ORDER — EZETIMIBE 10 MG PO TABS: 10.0000 mg | ORAL_TABLET | Freq: Every day | ORAL | 1 refills | Status: DC

## 2021-02-16 MED ORDER — METOPROLOL TARTRATE 25 MG PO TABS: 25.0000 mg | ORAL_TABLET | Freq: Two times a day (BID) | ORAL | 1 refills | Status: DC

## 2021-02-17 NOTE — Addendum Note (Signed)
Addended by: Britt Bottom on: 02/17/2021 04:13 PM   Modules accepted: Orders

## 2021-02-22 ENCOUNTER — Encounter: Payer: Self-pay | Admitting: Family Medicine

## 2021-02-22 ENCOUNTER — Other Ambulatory Visit: Payer: Self-pay

## 2021-02-22 ENCOUNTER — Ambulatory Visit (INDEPENDENT_AMBULATORY_CARE_PROVIDER_SITE_OTHER): Payer: Medicare HMO | Admitting: Family Medicine

## 2021-02-22 VITALS — BP 130/78 | HR 78 | Temp 97.4°F | Ht 60.0 in | Wt 217.0 lb

## 2021-02-22 DIAGNOSIS — Z7189 Other specified counseling: Secondary | ICD-10-CM | POA: Diagnosis not present

## 2021-02-22 DIAGNOSIS — E785 Hyperlipidemia, unspecified: Secondary | ICD-10-CM

## 2021-02-22 DIAGNOSIS — F419 Anxiety disorder, unspecified: Secondary | ICD-10-CM | POA: Diagnosis not present

## 2021-02-22 DIAGNOSIS — I2510 Atherosclerotic heart disease of native coronary artery without angina pectoris: Secondary | ICD-10-CM

## 2021-02-22 DIAGNOSIS — D509 Iron deficiency anemia, unspecified: Secondary | ICD-10-CM | POA: Diagnosis not present

## 2021-02-22 MED ORDER — IRON (FERROUS SULFATE) 325 (65 FE) MG PO TABS: 325.0000 mg | ORAL_TABLET | Freq: Every day | ORAL | 1 refills | Status: DC

## 2021-02-22 MED ORDER — ISOSORBIDE MONONITRATE ER 60 MG PO TB24: ORAL_TABLET | ORAL | 3 refills | Status: DC

## 2021-02-22 MED ORDER — PANTOPRAZOLE SODIUM 40 MG PO TBEC: DELAYED_RELEASE_TABLET | ORAL | 3 refills | Status: DC

## 2021-02-22 MED ORDER — ATORVASTATIN CALCIUM 20 MG PO TABS: 20.0000 mg | ORAL_TABLET | Freq: Every day | ORAL | 3 refills | Status: DC

## 2021-02-22 NOTE — Patient Instructions (Addendum)
Start iron daily.  If not tolerated, try every other day.  Take care.  Glad to see you. We'll call about seeing the GI clinic.  Plan on recheck labs in about 1 month.   Check with your insurance to see if they will cover the shingles shot.

## 2021-02-22 NOTE — Progress Notes (Signed)
This visit occurred during the SARS-CoV-2 public health emergency.  Safety protocols were in place, including screening questions prior to the visit, additional usage of staff PPE, and extensive cleaning of exam room while observing appropriate contact time as indicated for disinfecting solutions.  Anxiety.  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  He'll move in with patient in the near future.  D/w pt.  Still taking BZD at baseline, with relief.  Ambient helps her get to sleep but she still has some waking in the middle of the night at baseline.    CAD.    Using medication without problems or lightheadedness: yes Chest pain with exertion:no Edema:no Short of breath:no  She had vertigo prev, resolved now.  Not lightheaded.    IDA.  No gross blood in stool.  HGB slightly lower on most recent check.  EGD and colonoscopy done 2021.  Not taking any extra iron currently or recently.    Elevated Cholesterol: Using medications without problems: yes Muscle aches:  no Diet compliance: yes.   Exercise: encouraged Labs d/w pt.    Colonoscopy up to date.   Mammogram due, d/w pt.   DXA not due.   Advance directive d/w pt. [name] designated if patient were incapacitated.  Vaccines d/w pt.   PMH and SH reviewed Meds, vitals, and allergies reviewed.   ROS: Per HPI.  Unless specifically indicated otherwise in HPI, the patient denies:  General: fever. Eyes: acute vision changes ENT: sore throat Cardiovascular: chest pain Respiratory: SOB GI: vomiting GU: dysuria Musculoskeletal: acute back pain Derm: acute rash Neuro: acute motor dysfunction Psych: worsening mood Endocrine: polydipsia Heme: bleeding Allergy: hayfever  GEN: nad, alert and oriented HEENT: ncat NECK: supple w/o LA CV: rrr. PULM: ctab, no inc wob ABD: soft, +bs EXT: no edema SKIN: no acute rash

## 2021-02-23 ENCOUNTER — Encounter: Payer: Self-pay | Admitting: *Deleted

## 2021-02-24 DIAGNOSIS — D509 Iron deficiency anemia, unspecified: Secondary | ICD-10-CM | POA: Insufficient documentation

## 2021-02-24 NOTE — Assessment & Plan Note (Signed)
Lipids minimally above goal with LDL 77, would continue statin and zetia and continue work on diet and exercise.

## 2021-02-24 NOTE — Assessment & Plan Note (Signed)
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  He'll move in with patient in the near future.  D/w pt.  Still taking BZD at baseline, with relief.  Ambient helps her get to sleep but she still has some waking in the middle of the night at baseline.    Would still continue alprazolam and Ambien at baseline.

## 2021-02-24 NOTE — Assessment & Plan Note (Signed)
No chest pain.  Would continue aspirin Lipitor Zetia metoprolol and as needed nitroglycerin.  Labs discussed with patient.  We will update cardiology as FYI.

## 2021-02-24 NOTE — Assessment & Plan Note (Signed)
Advance directive d/w pt. Andrea Webster designated if patient were incapacitated.

## 2021-02-24 NOTE — Assessment & Plan Note (Signed)
No gross blood in stool.  HGB slightly lower on most recent check.  EGD and colonoscopy done 2021.  Not taking any extra iron currently or recently.  Advised to start iron, refer to GI for consideration of capsule endoscopy, and recheck labs in about 1 month.  Routine cautions given to patient.  She agrees with plan.  See after visit summary.

## 2021-03-19 ENCOUNTER — Other Ambulatory Visit: Payer: Self-pay | Admitting: Family Medicine

## 2021-03-27 ENCOUNTER — Ambulatory Visit
Admission: RE | Admit: 2021-03-27 | Discharge: 2021-03-27 | Disposition: A | Discharge status: Home / Self Care | Payer: Medicare HMO | Source: Ambulatory Visit | Attending: Family Medicine | Admitting: Family Medicine

## 2021-03-27 DIAGNOSIS — Z1231 Encounter for screening mammogram for malignant neoplasm of breast: Secondary | ICD-10-CM

## 2021-03-30 ENCOUNTER — Other Ambulatory Visit (INDEPENDENT_AMBULATORY_CARE_PROVIDER_SITE_OTHER): Payer: Medicare HMO

## 2021-03-30 DIAGNOSIS — D509 Iron deficiency anemia, unspecified: Secondary | ICD-10-CM | POA: Diagnosis not present

## 2021-03-30 LAB — CBC WITH DIFFERENTIAL/PLATELET
Basophils Absolute: 0 10*3/uL (ref 0.0–0.1)
Basophils Relative: 0.7 % (ref 0.0–3.0)
Eosinophils Absolute: 0.2 10*3/uL (ref 0.0–0.7)
Eosinophils Relative: 2 % (ref 0.0–5.0)
HCT: 37.5 % (ref 36.0–46.0)
Hemoglobin: 12.4 g/dL (ref 12.0–15.0)
Lymphocytes Relative: 22.1 % (ref 12.0–46.0)
Lymphs Abs: 1.7 10*3/uL (ref 0.7–4.0)
MCHC: 33 g/dL (ref 30.0–36.0)
MCV: 78 fl (ref 78.0–100.0)
Monocytes Absolute: 0.6 10*3/uL (ref 0.1–1.0)
Monocytes Relative: 7.6 % (ref 3.0–12.0)
Neutro Abs: 5.1 10*3/uL (ref 1.4–7.7)
Neutrophils Relative %: 67.6 % (ref 43.0–77.0)
Platelets: 244 10*3/uL (ref 150.0–400.0)
RBC: 4.81 Mil/uL (ref 3.87–5.11)
RDW: 26.7 % — ABNORMAL HIGH (ref 11.5–15.5)
WBC: 7.5 10*3/uL (ref 4.0–10.5)

## 2021-03-30 LAB — IRON: Iron: 42 ug/dL (ref 42–145)

## 2021-03-31 ENCOUNTER — Other Ambulatory Visit: Payer: Self-pay | Admitting: Family Medicine

## 2021-03-31 DIAGNOSIS — D509 Iron deficiency anemia, unspecified: Secondary | ICD-10-CM

## 2021-04-26 ENCOUNTER — Other Ambulatory Visit: Payer: Self-pay | Admitting: Family Medicine

## 2021-05-27 DIAGNOSIS — Z0100 Encounter for examination of eyes and vision without abnormal findings: Secondary | ICD-10-CM | POA: Diagnosis not present

## 2021-07-03 ENCOUNTER — Other Ambulatory Visit: Payer: Self-pay | Admitting: Cardiovascular Disease

## 2021-07-03 ENCOUNTER — Other Ambulatory Visit: Payer: Self-pay | Admitting: Family Medicine

## 2021-07-06 ENCOUNTER — Other Ambulatory Visit: Payer: Self-pay | Admitting: Family Medicine

## 2021-07-06 NOTE — Telephone Encounter (Signed)
Refill request for Alprazolam 1 mg tablet  LOV - 02/22/21 Next OV - not scheduled Last refill - 01/15/21 #90/2

## 2021-07-07 NOTE — Telephone Encounter (Signed)
Sent. Thanks.   

## 2021-07-22 ENCOUNTER — Telehealth: Payer: Self-pay | Admitting: Family Medicine

## 2021-07-23 MED ORDER — NYSTATIN 100000 UNIT/GM EX POWD: Freq: Three times a day (TID) | CUTANEOUS | 0 refills | Status: DC

## 2021-07-23 MED ORDER — FLUCONAZOLE 150 MG PO TABS: 150.0000 mg | ORAL_TABLET | ORAL | 0 refills | Status: DC

## 2021-07-23 NOTE — Telephone Encounter (Signed)
Please triage patient/see what details you can get.  Thanks.

## 2021-07-23 NOTE — Telephone Encounter (Signed)
She stated that she has her issue back and is in need of the prescription that [facility] on [address].

## 2021-07-23 NOTE — Addendum Note (Signed)
Addended by: Tonia Ghent on: 07/23/2021 05:04 PM   Modules accepted: Orders

## 2021-07-23 NOTE — Telephone Encounter (Signed)
Patient notified rxs sent and advised to call back if she does not get better.

## 2021-07-23 NOTE — Telephone Encounter (Signed)
Sent both.  Update us/get rechecked if not getting better.  Thanks.

## 2021-07-23 NOTE — Telephone Encounter (Signed)
I spoke with pt; pt said has same fungal infection of March in 2019. Pt knows what it is. 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 Pt was offered an appt with different provider earlier but pt only wants to see Dr Damita Dunnings who does not have a close future appt. Pt put antifungal cream that did not work and pt request refills on nystatin powder and fluconazole to [facility] [address]. Rash is not draining any but itches terribly. Pt request cb after Dr Damita Dunnings reviews this note. Sending note to Dr Damita Dunnings and Janett Billow CMA.

## 2021-07-29 ENCOUNTER — Telehealth: Payer: Self-pay | Admitting: Family Medicine

## 2021-08-04 NOTE — Telephone Encounter (Signed)
PA done in [email or web address] and was approved until 11/20/21.

## 2021-08-09 ENCOUNTER — Other Ambulatory Visit: Payer: Self-pay | Admitting: Family Medicine

## 2021-08-25 ENCOUNTER — Other Ambulatory Visit: Payer: Self-pay | Admitting: Cardiovascular Disease

## 2021-09-08 ENCOUNTER — Other Ambulatory Visit: Payer: Self-pay | Admitting: Family Medicine

## 2021-09-09 NOTE — Telephone Encounter (Signed)
Refill request for zolpidem (AMBIEN) 10 MG tablet  LOV - 02/22/21 Next OV - not scheduled Last refill - 01/15/21 #90/1

## 2021-09-09 NOTE — Telephone Encounter (Signed)
Sent. Thanks.   

## 2021-09-17 ENCOUNTER — Ambulatory Visit (INDEPENDENT_AMBULATORY_CARE_PROVIDER_SITE_OTHER): Payer: Medicare HMO

## 2021-09-17 ENCOUNTER — Other Ambulatory Visit: Payer: Self-pay

## 2021-09-17 DIAGNOSIS — Z23 Encounter for immunization: Secondary | ICD-10-CM

## 2021-11-16 ENCOUNTER — Other Ambulatory Visit: Payer: Self-pay | Admitting: Family Medicine

## 2021-11-16 NOTE — Telephone Encounter (Signed)
Refill request for ALPRAZOLAM 1 MG Tablet  LOV - 02/22/21 Next OV - not scheduled Last refill - 07/07/21 #90/1

## 2021-11-17 NOTE — Telephone Encounter (Signed)
Sent. Thanks.   

## 2022-01-24 NOTE — Progress Notes (Signed)
Subjective:   Andrea Webster is a 66 y.o. female who presents for Medicare Annual (Subsequent) preventive examination.  I connected with Hillery Hunter today by telephone and verified that I am speaking with the correct person using two identifiers. Location patient: home Location provider: work Persons participating in the virtual visit: patient, Marine scientist.    I discussed the limitations, risks, security and privacy concerns of performing an evaluation and management service by telephone and the availability of in person appointments. I also discussed with the patient that there may be a patient responsible charge related to this service. The patient expressed understanding and verbally consented to this telephonic visit.    Interactive audio and video telecommunications were attempted between this provider and patient, however failed, due to patient having technical difficulties OR patient did not have access to video capability.  We continued and completed visit with audio only.  Some vital signs may be absent or patient reported.   Time Spent with patient on telephone encounter: 20 minutes  Review of Systems     Cardiac Risk Factors include: advanced age (>26mn, >>65women);hypertension;dyslipidemia     Objective:    Today's Vitals   01/25/22 1112  Weight: 217 lb (98.4 kg)  Height: 5' (1.524 m)   Body mass index is 42.38 kg/m.  Advanced Directives 01/25/2022 06/26/2020 04/29/2020 04/23/2020 09/05/2019 09/04/2019 09/04/2019  Does Patient Have a Medical Advance Directive? No No No No No No No  Would patient like information on creating a medical advance directive? Yes (MAU/Ambulatory/Procedural Areas - Information given) - No - Patient declined Yes (MAU/Ambulatory/Procedural Areas - Information given) No - Patient declined Yes (Inpatient - patient requests chaplain consult to create a medical advance directive) No - Patient declined    Current Medications (verified) Outpatient Encounter  Medications as of 01/25/2022  Medication Sig   albuterol (VENTOLIN HFA) 108 (90 Base) MCG/ACT inhaler INHALE 2 PUFFS EVERY 6  HOURS AS NEEDED FOR WHEEZING OR SHORTNESS OF BREATH.   ALPRAZolam (XANAX) 1 MG tablet TAKE 1 TABLET THREE TIMES DAILY AS NEEDED FOR ANXIETY   aspirin 81 MG EC tablet Take 81 mg by mouth daily.    atorvastatin (LIPITOR) 20 MG tablet Take 1 tablet (20 mg total) by mouth daily.   cetirizine (ZYRTEC) 10 MG tablet Take 10 mg by mouth daily.   Cholecalciferol (VITAMIN D) 50 MCG (2000 UT) CAPS Take 2,000 Units by mouth daily.    ezetimibe (ZETIA) 10 MG tablet TAKE 1 TABLET EVERY DAY   fluticasone (FLONASE) 50 MCG/ACT nasal spray USE 2 SPRAYS IN EACH NOSTRIL ONE TIME DAILY   isosorbide mononitrate (IMDUR) 60 MG 24 hr tablet TAKE 1 TABLET EVERY DAY   metoprolol tartrate (LOPRESSOR) 25 MG tablet TAKE 1 TABLET TWICE DAILY   Multiple Vitamins-Minerals (MULTIVITAMIN WITH MINERALS) tablet Take 1 tablet by mouth daily.   nitroGLYCERIN (NITROSTAT) 0.4 MG SL tablet DISSOLVE 1 TABLET UNDER THE TONGUE EVERY 5 MINUTES AS NEEDED FOR CHEST PAIN AS DIRECTED   pantoprazole (PROTONIX) 40 MG tablet TAKE 1 TABLET EVERY DAY   senna (SENOKOT) 8.6 MG tablet Take 1 tablet by mouth at bedtime. Takes at night   zolpidem (AMBIEN) 10 MG tablet TAKE 1 TABLET (10 MG TOTAL) BY MOUTH AT BEDTIME AS NEEDED FOR SLEEP   fluconazole (DIFLUCAN) 150 MG tablet Take 1 tablet (150 mg total) by mouth once a week. (Patient not taking: Reported on 01/25/2022)   NYSTATIN powder APPLY  POWDER TOPICALLY THREE TIMES DAILY (Patient not taking:  Reported on 01/25/2022)   No facility-administered encounter medications on file as of 01/25/2022.    Allergies (verified) Adhesive [tape], Paroxetine hcl, Crestor [rosuvastatin calcium], Effexor [venlafaxine], Lipitor [atorvastatin], Sertraline hcl, and Wellbutrin [bupropion hcl]   History: Past Medical History:  Diagnosis Date   Anxiety    Arthritis    Baker's cyst    x 2   CAD  (coronary artery disease)    a. s/p MI & PTCA - age 35;  b. 04/2015 PCI: LM nl, LAD 60p (FFR 0.83), D1 60ost, LCX 50p, 54m(2.75x15 Xience Alpine DES), OM1/2/3 min irregs, RCA 1028mTO; c. 08/2019 PCI: LM nl, LAD mod prox dzs, LCX patent stent w/ severe prox dzs (DES), RCA CTO mid.   Chronic sciatica    Complication of anesthesia    Constipation    pretty severe, makes hernia discomfort much worse.   Depression    intolerant of paxil, wellbutrin, zoloft   Diastolic dysfunction    a. 08/2019 Echo: EF 60-65%, gr1 DD, PASP 4599m.   GERD (gastroesophageal reflux disease)    Hayfever    headaches    Heart disease    Heart murmur    Heart valve disease    High cholesterol    Hypertension    Incisional hernia    Insomnia    [place]ocardial infarction ([place]/2020   stented. first MI at age 35 37Plantar fasciitis    Primary insomnia    Rectal polyp 03/2020   RLS (restless legs syndrome)    Sciatica    Sleep apnea    snores very loudly   Smoker    a. quit 01/2014.   Past Surgical History:  Procedure Laterality Date   [place]   [place]   CARDIAC CATHETERIZATION N/A 04/30/2015   Procedure: Left Heart Cath;  Surgeon: MuhWellington HampshireD;  [facility];  Service: Cardiovascular;  Laterality: N/A;   CARPAL TUNNEL RELEASE     CES[place]orama HeightsCHOLECYSTECTOMY N/A 03/30/2016   Procedure: LAPAROSCOPIC CHOLECYSTECTOMY WITH INTRAOPERATIVE CHOLANGIOGRAM;  Surgeon: DieJules HusbandsD;  Location: [facility];  Service: General;  Laterality: N/A;   COLONOSCOPY WITH PROPOFOL N/A 06/26/2020   Procedure: COLONOSCOPY WITH PROPOFOL;  Surgeon: ByrRobert BellowD;  Location: [facility];  Service: Endoscopy;  Laterality: N/A;   COR[place]    CORONARY ANGIOPLASTY WITH STENT PLACEMENT  04/2015   [facility]  [facility] INTERVENTION N/A 09/05/2019   Procedure: CORONARY STENT INTERVENTION;  Surgeon: ParIsaias CowmanD;  [facility];  Service: Cardiovascular;  Laterality: N/A;   ESOPHAGOGASTRODUODENOSCOPY (EGD) WITH PROPOFOL N/A 06/26/2020   Procedure: ESOPHAGOGASTRODUODENOSCOPY (EGD) WITH PROPOFOL;  Surgeon: ByrRobert BellowD;  Location: [facility];  Service: Endoscopy;  Laterality: N/A;   EXCISION OF MESH N/A 04/29/2020   Procedure: EXCISION OF MESH;  Surgeon: ByrRobert BellowD;  Location: [facility];  Service: General;  Laterality: N/A;   HERNIA REPAIR  11/2016   [facility] Joh[facility]elle ArabianLEFT HEART CATH AND CORONARY ANGIOGRAPHY N/A 09/05/2019   Procedure: LEFT HEART CATH AND CORONARY ANGIOGRAPHY;  Surgeon: GolMinna MerrittsD;  [facility];  Service: Cardiovascular;  Laterality: N/A;   VAGINAL HYSTERECTOMY  VENTRAL HERNIA REPAIR N/A 04/29/2020   Procedure: HERNIA REPAIR VENTRAL ADULT;  Surgeon: Robert Bellow, MD;  Location: [facility];  Service: General;  Laterality: N/A;  Ventral hernia repair with component separation- General anesthesia and exparel block; Arvilla Meres, RNFA to assist   Family History  Problem Relation Age of Onset   Heart disease Mother    Diabetes Mother    Hypertension Mother    Heart attack Mother    Coronary artery disease Mother    Alcohol abuse Father    Heart disease Father    Heart attack Father    Coronary artery disease Father    Diabetes Brother    Hypertension Brother    Breast cancer Maternal Grandmother 80   Colon cancer Maternal Grandmother    Bladder Cancer Maternal Grandmother    Kidney cancer Maternal Grandmother    Social History   Socioeconomic History   Marital status: Significant Other    Spouse name: TIM   Number of children: Not on file   Years of education: Not on file   Highest education level: Not on file  Occupational History   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, 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    Comment: disabled  Tobacco Use   Smoking status: Former    Packs/day: 0.33    Years: 20.00    Pack years: 6.60    Types: Cigarettes    Quit date: 02/13/2015    Years since quitting: 6.9   Smokeless tobacco: Never  Vaping Use   Vaping Use: Never used  Substance and Sexual Activity   Alcohol use: Not Currently    Alcohol/week: 0.0 standard drinks   Drug use: No   Sexual activity: Not on file  Other Topics Concern   Not on file  Social History Narrative   From [place], to [place] 2011   Widow as of 2001   1 son prev incarcerated, out as of 2019, infrequent contact as of 2019   1 daughter   Lives with significant other, TIM   Social Determinants of Health   Financial Resource Strain: Low Risk    Difficulty of Paying Living Expenses: Not hard at all  Food Insecurity: No Food Insecurity   Worried About Charity fundraiser in the Last Year: Never true   Arboriculturist in the Last Year: Never true  Transportation Needs: No Transportation Needs   Lack of Transportation (Medical): No   Lack of Transportation (Non-Medical): No  Physical Activity: Sufficiently Active   Days of Exercise per Week: 7 days   Minutes of Exercise per Session: 30 min  Stress: No Stress Concern Present   Feeling of Stress : Not at all  Social Connections: Moderately Isolated   Frequency of Communication with Friends and Family: Three times a week   Frequency of Social Gatherings with Friends and Family: Once a week   Attends Religious Services: More than 4 times per year   Active Member of [employer] or Organizations: No   Attends Music therapist: Never   Marital Status: Never married    Tobacco Counseling Counseling given: Not Answered   Clinical Intake:  Pre-visit preparation completed: Yes  Pain : No/denies pain     BMI - recorded: 42.38 Nutritional Status: BMI > 30  Obese Nutritional Risks: None Diabetes: No  How often do you need to have someone help you when  you read instructions, pamphlets, or other written materials from your doctor or pharmacy?: 1 -  Never  Diabetic? No  Interpreter Needed?: No  Information entered by :: Orrin Brigham LPN   Activities of Daily Living In your present state of health, do you have any difficulty performing the following activities: 01/25/2022  Hearing? N  Vision? N  Difficulty concentrating or making decisions? N  Walking or climbing stairs? N  Dressing or bathing? N  Doing errands, shopping? N  Preparing Food and eating ? N  Using the Toilet? N  In the past six months, have you accidently leaked urine? N  Do you have problems with loss of bowel control? N  Managing your Medications? N  Managing your Finances? N  Some recent data might be hidden    Patient Care Team: Tonia Ghent, MD as PCP - General (Family Medicine) Wellington Hampshire, MD as PCP - Cardiology (Cardiology) Wellington Hampshire, MD as Consulting Physician (Cardiology) Bonner Puna, MD as Referring Physician [facility])  Indicate any recent Medical Services you may have received from other than [facility] providers in the past year (date may be approximate).     Assessment:   This is a routine wellness examination for [place].  Hearing/Vision screen Hearing Screening - Comments:: No issues  Vision Screening - Comments:: Last exam 2022, Dr. Waldemar Dickens  Dietary issues and exercise activities discussed: Current Exercise Habits: Home exercise routine, Type of exercise: walking, Time (Minutes): 30, Frequency (Times/Week): 7, Weekly Exercise (Minutes/Week): 210, Intensity: Moderate   Goals Addressed             This Visit's Progress    Patient Stated       Would like to continue walking routine       Depression Screen PHQ 2/9 Scores 01/25/2022 02/15/2018 01/23/2017 06/30/2014  PHQ - 2 Score 0 3 0 0  PHQ- 9 Score - 5 - -    Fall Risk Fall Risk  01/25/2022 02/15/2018 01/23/2017 06/30/2014  Falls in the past year? 0 No No Yes  Number falls  in past yr: 0 - - -  Injury with Fall? 0 - - -  Risk for fall due to : No Fall Risks - - -  Follow up Falls prevention discussed - - -    FALL RISK PREVENTION PERTAINING TO THE HOME:  Any stairs in or around the home? Yes  If so, are there any without handrails? No  Home free of loose throw rugs in walkways, pet beds, electrical cords, etc? Yes  Adequate lighting in your home to reduce risk of falls? Yes   ASSISTIVE DEVICES UTILIZED TO PREVENT FALLS:  Life alert? No  Use of a cane, walker or w/c? No  Grab bars in the bathroom? Yes  Shower chair or bench in shower? Yes  Elevated toilet seat or a handicapped toilet? No   TIMED UP AND GO:  Was the test performed? No .    Cognitive Function: Normal cognitive status assessed by this Nurse Health Advisor. No abnormalities found.   MMSE - Mini Mental State Exam 02/15/2018 01/23/2017  Orientation to time 5 5  Orientation to Place 5 5  Registration 3 3  Attention/ Calculation 0 0  Recall 3 3  Language- name 2 objects 0 0  Language- repeat 1 1  Language- follow 3 step command 3 3  Language- read & follow direction 0 0  Write a sentence 0 0  Copy design 0 0  Total score 20 20        Immunizations Immunization History  Administered Date(s) Administered  Influenza Split 11/01/2011   Influenza, Seasonal, Injecte, Preservative Fre 11/23/2012   Influenza,inj,Quad PF,6+ Mos 08/29/2013, 10/03/2014, 08/26/2015, 08/08/2016, 09/05/2017, 08/30/2018, 08/13/2019, 07/30/2020, 09/17/2021   Influenza-Unspecified 08/01/2016   PFIZER(Purple Top)SARS-COV-2 Vaccination 05/16/2020, 06/06/2020   Pneumococcal Polysaccharide-23 08/26/2015   Tdap 04/11/2013    TDAP status: Up to date  Flu Vaccine status: Up to date  Pneumococcal vaccine status: Up to date  Covid-19 vaccine status: Information provided on how to obtain vaccines.   Qualifies for Shingles Vaccine? Yes   Zostavax completed No   Shingrix Completed?: No.    Education has been  provided regarding the importance of this vaccine. Patient has been advised to call insurance company to determine out of pocket expense if they have not yet received this vaccine. Advised may also receive vaccine at local pharmacy or Health Dept. Verbalized acceptance and understanding.  Screening Tests Health Maintenance  Topic Date Due   Zoster Vaccines- Shingrix (1 of 2) Never done   COVID-19 Vaccine (3 - Booster for Pfizer series) 08/01/2020   MAMMOGRAM  03/28/2023   TETANUS/TDAP  04/12/2023   COLONOSCOPY (Pts 45-43yr Insurance coverage will need to be confirmed)  06/27/2023   INFLUENZA VACCINE  Completed   Hepatitis C Screening  Completed   HIV Screening  Completed   HPV VACCINES  Aged Out    Health Maintenance  Health Maintenance Due  Topic Date Due   Zoster Vaccines- Shingrix (1 of 2) Never done   COVID-19 Vaccine (3 - Booster for Pfizer series) 08/01/2020    Colorectal cancer screening: Type of screening: Colonoscopy. Completed 06/26/20. Repeat every 3 years  Mammogram status: Completed 03/27/21. Repeat every year  Bone Density: Not yet eligible  Lung Cancer Screening: (Low Dose CT Chest recommended if Age 65-80years, 30 pack-year currently smoking OR have quit w/in 15years.) does not qualify.     Additional Screening:  Hepatitis C Screening: does qualify; Completed 01/23/17  Vision Screening: Recommended annual ophthalmology exams for early detection of glaucoma and other disorders of the eye. Is the patient up to date with their annual eye exam?  Yes  Who is the provider or what is the name of the office in which the patient attends annual eye exams? Dr. MWaldemar Dickens  Dental Screening: Recommended annual dental exams for proper oral hygiene  Community Resource Referral / Chronic Care Management: CRR required this visit?  No   CCM required this visit?  No      Plan:     I have personally reviewed and noted the following in the patients chart:   Medical and  social history Use of alcohol, tobacco or illicit drugs  Current medications and supplements including opioid prescriptions.  Functional ability and status Nutritional status Physical activity Advanced directives List of other physicians Hospitalizations, surgeries, and ER visits in previous 12 months Vitals Screenings to include cognitive, depression, and falls Referrals and appointments  In addition, I have reviewed and discussed with patient certain preventive protocols, quality metrics, and best practice recommendations. A written personalized care plan for preventive services as well as general preventive health recommendations were provided to patient.   Due to this being a telephonic visit, the after visit summary with patients personalized plan was offered to patient via mail or my-chart. Patient would like to access on my-chart.     TLoma Messing LPN   [id number]  Nurse Health Advisor  Nurse Notes: none

## 2022-01-25 ENCOUNTER — Ambulatory Visit (INDEPENDENT_AMBULATORY_CARE_PROVIDER_SITE_OTHER): Payer: Medicare HMO

## 2022-01-25 VITALS — Ht 60.0 in | Wt 217.0 lb

## 2022-01-25 DIAGNOSIS — Z0000 Encounter for general adult medical examination without abnormal findings: Secondary | ICD-10-CM

## 2022-01-25 NOTE — Patient Instructions (Signed)
Andrea Webster , Thank you for taking time to complete your Medicare Wellness Visit. I appreciate your ongoing commitment to your health goals. Please review the following plan we discussed and let me know if I can assist you in the future.   Screening recommendations/referrals: Colonoscopy: up to date, completed 06/26/20, due 06/27/23 Mammogram: up to date, completed 03/27/21, due 03/27/22 Bone Density: not yet eligible  Recommended yearly ophthalmology/optometry visit for glaucoma screening and checkup Recommended yearly dental visit for hygiene and checkup  Vaccinations: Influenza vaccine: up to date  Pneumococcal vaccine: up to date  Tdap vaccine: up to date, due 04/12/23 Shingles vaccine: Discuss with pharmacy  Covid-19: newest booster available at your local pharmacy   Advanced directives: Please bring a copy of Living Will and/or [place] for your chart, when available    Conditions/risks identified: see problem list  Next appointment: Follow up in one year for your annual wellness visit. 01/27/23 @ 11:15am, this will be a telephone visit  Preventive Care 40-64 Years, Female Preventive care refers to lifestyle choices and visits with your health care provider that can promote health and wellness. What does preventive care include? A yearly physical exam. This is also called an annual well check. Dental exams once or twice a year. Routine eye exams. Ask your health care provider how often you should have your eyes checked. Personal lifestyle choices, including: Daily care of your teeth and gums. Regular physical activity. Eating a healthy diet. Avoiding tobacco and drug use. Limiting alcohol use. Practicing safe sex. Taking low-dose aspirin daily starting at age 55. Taking vitamin and mineral supplements as recommended by your health care provider. What happens during an annual well check? The services and screenings done by your health care provider during your  annual well check will depend on your age, overall health, lifestyle risk factors, and family history of disease. Counseling  Your health care provider may ask you questions about your: Alcohol use. Tobacco use. Drug use. Emotional well-being. Home and relationship well-being. Sexual activity. Eating habits. Work and work Statistician. Method of birth control. Menstrual cycle. Pregnancy history. Screening  You may have the following tests or measurements: Height, weight, and BMI. Blood pressure. Lipid and cholesterol levels. These may be checked every 5 years, or more frequently if you are over 86 years old. Skin check. Lung cancer screening. You may have this screening every year starting at age 31 if you have a 30-pack-year history of smoking and currently smoke or have quit within the past 15 years. Fecal occult blood test (FOBT) of the stool. You may have this test every year starting at age 26. Flexible sigmoidoscopy or colonoscopy. You may have a sigmoidoscopy every 5 years or a colonoscopy every 10 years starting at age 13. Hepatitis C blood test. Hepatitis B blood test. Sexually transmitted disease (STD) testing. Diabetes screening. This is done by checking your blood sugar (glucose) after you have not eaten for a while (fasting). You may have this done every 1-3 years. Mammogram. This may be done every 1-2 years. Talk to your health care provider about when you should start having regular mammograms. This may depend on whether you have a family history of breast cancer. BRCA-related cancer screening. This may be done if you have a family history of breast, ovarian, tubal, or peritoneal cancers. Pelvic exam and Pap test. This may be done every 3 years starting at age 77. Starting at age 29, this may be done every 5 years if  you have a Pap test in combination with an HPV test. Bone density scan. This is done to screen for osteoporosis. You may have this scan if you are at high  risk for osteoporosis. Discuss your test results, treatment options, and if necessary, the need for more tests with your health care provider. Vaccines  Your health care provider may recommend certain vaccines, such as: Influenza vaccine. This is recommended every year. Tetanus, diphtheria, and acellular pertussis (Tdap, Td) vaccine. You may need a Td booster every 10 years. Zoster vaccine. You may need this after age 28. Pneumococcal 13-valent conjugate (PCV13) vaccine. You may need this if you have certain conditions and were not previously vaccinated. Pneumococcal polysaccharide (PPSV23) vaccine. You may need one or two doses if you smoke cigarettes or if you have certain conditions. Talk to your health care provider about which screenings and vaccines you need and how often you need them. This information is not intended to replace advice given to you by your health care provider. Make sure you discuss any questions you have with your health care provider. Document Released: 12/04/2015 Document Revised: 07/27/2016 Document Reviewed: 09/08/2015 Elsevier Interactive Patient Education  2017 [place] Prevention in the Home Falls can cause injuries. They can happen to people of all ages. There are many things you can do to make your home safe and to help prevent falls. What can I do on the outside of my home? Regularly fix the edges of walkways and driveways and fix any cracks. Remove anything that might make you trip as you walk through a door, such as a raised step or threshold. Trim any bushes or trees on the path to your home. Use bright outdoor lighting. Clear any walking paths of anything that might make someone trip, such as rocks or tools. Regularly check to see if handrails are loose or broken. Make sure that both sides of any steps have handrails. Any raised decks and porches should have guardrails on the edges. Have any leaves, snow, or ice cleared regularly. Use  sand or salt on walking paths during winter. Clean up any spills in your garage right away. This includes oil or grease spills. What can I do in the bathroom? Use night lights. Install grab bars by the toilet and in the tub and shower. Do not use towel bars as grab bars. Use non-skid mats or decals in the tub or shower. If you need to sit down in the shower, use a plastic, non-slip stool. Keep the floor dry. Clean up any water that spills on the floor as soon as it happens. Remove soap buildup in the tub or shower regularly. Attach bath mats securely with double-sided non-slip rug tape. Do not have throw rugs and other things on the floor that can make you trip. What can I do in the bedroom? Use night lights. Make sure that you have a light by your bed that is easy to reach. Do not use any sheets or blankets that are too big for your bed. They should not hang down onto the floor. Have a firm chair that has side arms. You can use this for support while you get dressed. Do not have throw rugs and other things on the floor that can make you trip. What can I do in the kitchen? Clean up any spills right away. Avoid walking on wet floors. Keep items that you use a lot in easy-to-reach places. If you need to reach something above  you, use a strong step stool that has a grab bar. Keep electrical cords out of the way. Do not use floor polish or wax that makes floors slippery. If you must use wax, use non-skid floor wax. Do not have throw rugs and other things on the floor that can make you trip. What can I do with my stairs? Do not leave any items on the stairs. Make sure that there are handrails on both sides of the stairs and use them. Fix handrails that are broken or loose. Make sure that handrails are as long as the stairways. Check any carpeting to make sure that it is firmly attached to the stairs. Fix any carpet that is loose or worn. Avoid having throw rugs at the top or bottom of the  stairs. If you do have throw rugs, attach them to the floor with carpet tape. Make sure that you have a light switch at the top of the stairs and the bottom of the stairs. If you do not have them, ask someone to add them for you. What else can I do to help prevent falls? Wear shoes that: Do not have high heels. Have rubber bottoms. Are comfortable and fit you well. Are closed at the toe. Do not wear sandals. If you use a stepladder: Make sure that it is fully opened. Do not climb a closed stepladder. Make sure that both sides of the stepladder are locked into place. Ask someone to hold it for you, if possible. Clearly mark and make sure that you can see: Any grab bars or handrails. First and last steps. Where the edge of each step is. Use tools that help you move around (mobility aids) if they are needed. These include: Canes. Walkers. Scooters. Crutches. Turn on the lights when you go into a dark area. Replace any light bulbs as soon as they burn out. Set up your furniture so you have a clear path. Avoid moving your furniture around. If any of your floors are uneven, fix them. If there are any pets around you, be aware of where they are. Review your medicines with your doctor. Some medicines can make you feel dizzy. This can increase your chance of falling. Ask your doctor what other things that you can do to help prevent falls. This information is not intended to replace advice given to you by your health care provider. Make sure you discuss any questions you have with your health care provider. Document Released: 09/03/2009 Document Revised: 04/14/2016 Document Reviewed: 12/12/2014 Elsevier Interactive Patient Education  2017 [employer].

## 2022-02-01 ENCOUNTER — Other Ambulatory Visit: Payer: Self-pay | Admitting: Family Medicine

## 2022-02-01 NOTE — Telephone Encounter (Signed)
Patient needs appt scheduled for next month ?

## 2022-02-02 ENCOUNTER — Other Ambulatory Visit: Payer: Self-pay

## 2022-02-02 MED ORDER — ZOLPIDEM TARTRATE 10 MG PO TABS: 10.0000 mg | ORAL_TABLET | Freq: Every evening | ORAL | 1 refills | Status: DC | PRN

## 2022-02-02 NOTE — Progress Notes (Signed)
Rx sent 

## 2022-02-03 NOTE — Telephone Encounter (Signed)
Lvm for pt to call to schedule f/u or physical . I also sent my chart letter ?

## 2022-02-28 ENCOUNTER — Encounter: Payer: Self-pay | Admitting: Family Medicine

## 2022-02-28 ENCOUNTER — Ambulatory Visit (INDEPENDENT_AMBULATORY_CARE_PROVIDER_SITE_OTHER): Payer: Medicare HMO | Admitting: Family Medicine

## 2022-02-28 VITALS — BP 138/80 | HR 82 | Temp 97.8°F | Ht 60.0 in | Wt 239.0 lb

## 2022-02-28 DIAGNOSIS — J302 Other seasonal allergic rhinitis: Secondary | ICD-10-CM | POA: Diagnosis not present

## 2022-02-28 DIAGNOSIS — F419 Anxiety disorder, unspecified: Secondary | ICD-10-CM

## 2022-02-28 DIAGNOSIS — E785 Hyperlipidemia, unspecified: Secondary | ICD-10-CM

## 2022-02-28 DIAGNOSIS — D509 Iron deficiency anemia, unspecified: Secondary | ICD-10-CM | POA: Diagnosis not present

## 2022-02-28 DIAGNOSIS — Z7189 Other specified counseling: Secondary | ICD-10-CM

## 2022-02-28 DIAGNOSIS — G4700 Insomnia, unspecified: Secondary | ICD-10-CM

## 2022-02-28 DIAGNOSIS — Z0000 Encounter for general adult medical examination without abnormal findings: Secondary | ICD-10-CM

## 2022-02-28 DIAGNOSIS — Z6841 Body Mass Index (BMI) 40.0 and over, adult: Secondary | ICD-10-CM | POA: Diagnosis not present

## 2022-02-28 DIAGNOSIS — M549 Dorsalgia, unspecified: Secondary | ICD-10-CM | POA: Diagnosis not present

## 2022-02-28 LAB — COMPREHENSIVE METABOLIC PANEL
ALT: 37 U/L — ABNORMAL HIGH (ref 0–35)
AST: 41 U/L — ABNORMAL HIGH (ref 0–37)
Albumin: 4.3 g/dL (ref 3.5–5.2)
Alkaline Phosphatase: 120 U/L — ABNORMAL HIGH (ref 39–117)
BUN: 15 mg/dL (ref 6–23)
CO2: 30 mEq/L (ref 19–32)
Calcium: 9.4 mg/dL (ref 8.4–10.5)
Chloride: 98 mEq/L (ref 96–112)
Creatinine, Ser: 0.81 mg/dL (ref 0.40–1.20)
GFR: 76.7 mL/min (ref >60.00)
Glucose, Bld: 115 mg/dL — ABNORMAL HIGH (ref 70–99)
Potassium: 4.8 mEq/L (ref 3.5–5.1)
Sodium: 135 mEq/L (ref 135–145)
Total Bilirubin: 1.2 mg/dL (ref 0.2–1.2)
Total Protein: 7.3 g/dL (ref 6.0–8.3)

## 2022-02-28 LAB — CBC WITH DIFFERENTIAL/PLATELET
Basophils Absolute: 0 10*3/uL (ref 0.0–0.1)
Basophils Relative: 0.5 % (ref 0.0–3.0)
Eosinophils Absolute: 0.1 10*3/uL (ref 0.0–0.7)
Eosinophils Relative: 1.8 % (ref 0.0–5.0)
HCT: 34.9 % — ABNORMAL LOW (ref 36.0–46.0)
Hemoglobin: 11.7 g/dL — ABNORMAL LOW (ref 12.0–15.0)
Lymphocytes Relative: 19.9 % (ref 12.0–46.0)
Lymphs Abs: 1.2 10*3/uL (ref 0.7–4.0)
MCHC: 33.7 g/dL (ref 30.0–36.0)
MCV: 83.2 fl (ref 78.0–100.0)
Monocytes Absolute: 0.5 10*3/uL (ref 0.1–1.0)
Monocytes Relative: 7.7 % (ref 3.0–12.0)
Neutro Abs: 4.4 10*3/uL (ref 1.4–7.7)
Neutrophils Relative %: 70.1 % (ref 43.0–77.0)
Platelets: 217 10*3/uL (ref 150.0–400.0)
RBC: 4.2 Mil/uL (ref 3.87–5.11)
RDW: 15.6 % — ABNORMAL HIGH (ref 11.5–15.5)
WBC: 6.3 10*3/uL (ref 4.0–10.5)

## 2022-02-28 LAB — LIPID PANEL
Cholesterol: 141 mg/dL (ref 0–200)
HDL: 44.2 mg/dL (ref >39.00)
LDL Cholesterol: 72 mg/dL (ref 0–99)
NonHDL: 96.77
Total CHOL/HDL Ratio: 3
Triglycerides: 125 mg/dL (ref 0.0–149.0)
VLDL: 25 mg/dL (ref 0.0–40.0)

## 2022-02-28 LAB — IRON: Iron: 61 ug/dL (ref 42–145)

## 2022-02-28 MED ORDER — KETOTIFEN FUMARATE 0.025 % OP SOLN: 1.0000 [drp] | Freq: Two times a day (BID) | OPHTHALMIC | Status: AC

## 2022-02-28 MED ORDER — CYCLOBENZAPRINE HCL 10 MG PO TABS: 5.0000 mg | ORAL_TABLET | Freq: Every day | ORAL | 1 refills | Status: DC

## 2022-02-28 NOTE — Progress Notes (Signed)
Elevated Cholesterol: ?Using medications without problems: yes ?Muscle aches: no ?Diet compliance: d/w pt.   ?Exercise: d/w pt.   ?Labs pending.   ? ?Back pain, "my back is irritated."  Prev had used flexeril with relief.  No midline pain but B lower back muscle pain.  No blood in stool.  No hematuria.  Going on for about 6 months.  No radiation into the legs.   ? ?Insomnia.  Ambien stopped working.  Trouble getting to sleep.  She is caring for her brother.  D/w pt about managing stressors and sleep hygiene.  ? ?Colonoscopy 2021 ?Mammogram 2022  ?Tetanus 2014 ?PNA 2016 ?Covid vaccine prev done.  ?Flu 2022 ?Shingrix d/w pt.   ?DXA not due.   ?Advance directive  D/w pt.  [name] designated if patient were incapacitated ? ?Seasonal allergies.  Noted as soon as she goes outside.  Irritation in the eyes B.  No vision loss.   ? ?Anxiety.  Taking BZD prn. Usually BID, occ TID.  No ADE on med.   ? ?PMH and SH reviewed ? ?Meds, vitals, and allergies reviewed.  ? ?ROS: Per HPI unless specifically indicated in ROS section  ? ?GEN: nad, alert and oriented ?HEENT: ncat ?NECK: supple w/o LA ?CV: rrr. Faint SEM noted.  ?PULM: ctab, no inc wob ?ABD: soft, +bs ?EXT: no edema ?SKIN: well perfused.   ?Back not ttp in midline.   ? ?30 minutes were devoted to patient care in this encounter (this includes time spent reviewing the patient's file/history, interviewing and examining the patient, counseling/reviewing plan with patient).  ? ?

## 2022-02-28 NOTE — Patient Instructions (Addendum)
Go to the lab on the way out.   If you have mychart we'll likely use that to update you.    ?Take care.  Glad to see you. ?Try zaditor for eye allergy symptoms.  Let me know if that isn't helping.  ?I will see about options re: insomnia if flexeril doesn't work.  Update me as needed.  ?

## 2022-03-03 ENCOUNTER — Other Ambulatory Visit: Payer: Self-pay | Admitting: Family Medicine

## 2022-03-04 DIAGNOSIS — M549 Dorsalgia, unspecified: Secondary | ICD-10-CM | POA: Insufficient documentation

## 2022-03-04 DIAGNOSIS — J302 Other seasonal allergic rhinitis: Secondary | ICD-10-CM | POA: Insufficient documentation

## 2022-03-04 NOTE — Assessment & Plan Note (Signed)
Can try zaditor for eye sx and update me as needed.  She agrees.   ?

## 2022-03-04 NOTE — Telephone Encounter (Signed)
Last filled on 11/17/21 #90 with 1 refill ?LOV 02/28/22  ?

## 2022-03-04 NOTE — Assessment & Plan Note (Signed)
Can try flexeril at night and see if that helps.  Routine cautions d/w pt.   ?

## 2022-03-04 NOTE — Assessment & Plan Note (Signed)
Advance directive  D/w pt.  Andrea Webster designated if patient were incapacitated ?

## 2022-03-04 NOTE — Assessment & Plan Note (Signed)
Continue taking xanax prn. Usually BID, occ TID.  No ADE on med.   ?

## 2022-03-04 NOTE — Assessment & Plan Note (Signed)
Ambien stopped working.  Trouble getting to sleep.  She is caring for her brother.  D/w pt about managing stressors and sleep hygiene. Can try flexeril at night and see if that helps.  Routine cautions d/w pt.  Stop ambien.   ?

## 2022-03-04 NOTE — Assessment & Plan Note (Signed)
Colonoscopy 2021 ?Mammogram 2022  ?Tetanus 2014 ?PNA 2016 ?Covid vaccine prev done.  ?Flu 2022 ?Shingrix d/w pt.   ?DXA not due.   ?Advance directive  D/w pt.  [name] designated if patient were incapacitated ?

## 2022-03-06 ENCOUNTER — Other Ambulatory Visit: Payer: Self-pay | Admitting: Family Medicine

## 2022-03-06 DIAGNOSIS — R7989 Other specified abnormal findings of blood chemistry: Secondary | ICD-10-CM

## 2022-03-06 NOTE — Telephone Encounter (Signed)
Sent. Thanks.   

## 2022-04-07 ENCOUNTER — Other Ambulatory Visit: Payer: Self-pay

## 2022-04-07 ENCOUNTER — Other Ambulatory Visit: Payer: Self-pay | Admitting: *Deleted

## 2022-04-08 MED ORDER — CYCLOBENZAPRINE HCL 10 MG PO TABS: 5.0000 mg | ORAL_TABLET | Freq: Every day | ORAL | 1 refills | Status: DC

## 2022-04-11 MED ORDER — NITROGLYCERIN 0.4 MG SL SUBL: SUBLINGUAL_TABLET | SUBLINGUAL | 0 refills | Status: DC

## 2022-04-11 NOTE — Telephone Encounter (Signed)
Scheduled

## 2022-04-12 ENCOUNTER — Other Ambulatory Visit: Payer: Self-pay | Admitting: Family Medicine

## 2022-04-15 ENCOUNTER — Ambulatory Visit
Admission: RE | Admit: 2022-04-15 | Discharge: 2022-04-15 | Disposition: A | Discharge status: Home / Self Care | Payer: Medicare HMO | Source: Ambulatory Visit | Attending: Family Medicine | Admitting: Family Medicine

## 2022-04-15 DIAGNOSIS — R945 Abnormal results of liver function studies: Secondary | ICD-10-CM | POA: Diagnosis not present

## 2022-04-15 DIAGNOSIS — R7989 Other specified abnormal findings of blood chemistry: Secondary | ICD-10-CM | POA: Diagnosis not present

## 2022-05-09 ENCOUNTER — Other Ambulatory Visit: Payer: Self-pay | Admitting: Cardiovascular Disease

## 2022-05-23 NOTE — Progress Notes (Deleted)
Cardiology Office Note    Date:  05/23/2022   ID:  Andrea Webster, DOB 08-25-1956, MRN [id number]  PCP:  Tonia Ghent, MD  Cardiologist:  Kathlyn Sacramento, MD  Electrophysiologist:  None   Chief Complaint: Follow up  History of Present Illness:   Andrea Webster is a 66 y.o. female with history of CAD with MI at age 60 status post PTCA to unknown vessel, HTN, HLD intolerant to high intensity statins including abnormal LFTs noted on atorvastatin, strong family history of CAD, iron deficiency anemia, prior tobacco use until 01/2015, obesity, and GERD who presents for follow-up of CAD.  LHC in 04/2015 showed CTO of the RCA, severe mid LCx disease with 90% stenosis, and moderate proximal LAD disease.  FFR within the LAD was nonsignificant at 0.83.  The LCx was felt to be the culprit vessel and was treated successfully with PCI/DES.  Lexiscan MPI in 10/2016 showed a small mild region of ischemia noted in the mid to apical lateral wall, unable to exclude attenuation artifact, and was overall low risk given very small mild perfusion defect.  Echo in 11/2016 demonstrated an EF of 60 to 65%, no regional wall motion abnormalities, grade 1 diastolic dysfunction, calcified mitral annulus, mildly dilated left atrium, and trivial pericardial effusion.  She was admitted to the hospital in 08/2019 with unstable angina.  Echo showed an EF of 60 to 65% with grade 1 diastolic dysfunction, mild aortic valve sclerosis without evidence of stenosis, and moderate pulmonary hypertension with a PASP of 45 mmHg.  LHC showed CTO of the RCA, moderate proximal LAD disease, patent LCx stent with severe proximal stenosis and moderate distal disease.  She underwent successful PCI/DES to the proximal LCx.  She was last seen in the office in 01/2021 and was doing reasonably well.  ***   Labs independently reviewed: 02/2022 - TC 141, TG 125, HDL 44, LDL 72, potassium 4.8, BUN 15, SCr 0.81albumin 4.3, AST 41, ALT 37, HGB 11.7, PLT  217 05/2019 - A1c 5.9 03/2015 - TSH normal  Past Medical History:  Diagnosis Date   Anxiety    Arthritis    Baker's cyst    x 2   CAD (coronary artery disease)    a. s/p MI & PTCA - age 44;  b. 04/2015 PCI: LM nl, LAD 60p (FFR 0.83), D1 60ost, LCX 50p, 68m(2.75x15 Xience Alpine DES), OM1/2/3 min irregs, RCA 1061mTO; c. 08/2019 PCI: LM nl, LAD mod prox dzs, LCX patent stent w/ severe prox dzs (DES), RCA CTO mid.   Chronic sciatica    Complication of anesthesia    Constipation    Depression    intolerant of paxil, wellbutrin, zoloft   Diastolic dysfunction    a. 08/2019 Echo: EF 60-65%, gr1 DD, PASP 4548m.   GERD (gastroesophageal reflux disease)    Hayfever    headaches    Heart disease    Heart murmur    Heart valve disease    High cholesterol    Hypertension    Incisional hernia    Insomnia    [place] ([place]   stented. first MI at age 53 109Plantar fasciitis    Primary insomnia    Rectal polyp 03/2020   RLS (restless legs syndrome)    Sciatica    Sleep apnea    snores very loudly   Smoker    a. quit 01/2014.    Past Surgical History:  Procedure Laterality Date  [place]   open   [facility]   CARDIAC CATHETERIZATION N/A 04/30/2015   Procedure: Left Heart Cath;  Surgeon: Wellington Hampshire, MD;  Location: [facility];  Service: Cardiovascular;  Laterality: N/A;   CARPAL TUNNEL RELEASE     [place]   CHOLECYSTECTOMY N/A 03/30/2016   Procedure: LAPAROSCOPIC CHOLECYSTECTOMY WITH INTRAOPERATIVE CHOLANGIOGRAM;  Surgeon: Jules Husbands, MD;  Location: [facility];  Service: General;  Laterality: N/A;   COLONOSCOPY WITH PROPOFOL N/A 06/26/2020   Procedure: COLONOSCOPY WITH PROPOFOL;  Surgeon: Robert Bellow, MD;  Location: [facility];  Service: Endoscopy;  Laterality: N/A;   [place]  [place]    CORONARY AN[facility]LASTY WITH STENT PLAC[facility]  [facility]   CORONARY STENT INTERVENTION N/A 09/05/2019   Procedure: CORONARY STENT INTERVENTION;  Surgeon: Isaias Cowman, MD;  Location: [facility];  Service: Cardiovascular;  Laterality: N/A;   ESOPHAGOGASTRODUODENOSCOPY (EGD) WITH PROPOFOL N/A 06/26/2020   Procedure: ESOPHAGOGASTRODUODENOSCOPY (EGD) WITH PROPOFOL;  Surgeon: Robert Bellow, MD;  Location: [facility];  Service: Endoscopy;  Laterality: N/A;   EXCISION OF MESH N/A 04/29/2020   Procedure: EXCISION OF MESH;  Surgeon: Robert Bellow, MD;  Location: [facility];  Service: General;  Laterality: N/A;   HERNIA REPAIR  11/2016   [facility]; Dr. Gaynelle Arabian   LEFT HEART CATH AND CORONARY ANGIOGRAPHY N/A 09/05/2019   Procedure: LEFT HEART CATH AND CORONARY ANGIOGRAPHY;  Surgeon: Minna Merritts, MD;  Location: [place] [facility];  Service: Cardiovascular;  Laterality: N/A;   VAGINAL HYSTERECTOMY     VENTRAL HERNIA REPAIR N/A 04/29/2020   Procedure: HERNIA REPAIR VENTRAL ADULT;  Surgeon: Robert Bellow, MD;  Location: [facility];  Service: General;  Laterality: N/A;  Ventral hernia repair with component separation- General anesthesia and exparel block; Arvilla Meres, RNFA to assist    Current Medications: No outpatient medications have been marked as taking for the 05/27/22 encounter (Appointment) with Rise Mu, PA-C.    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    Comment: disabled  Tobacco Use   Smoking status: Former     Packs/day: 0.33    Years: 20.00    Total pack years: 6.60    Types: Cigarettes    Quit date: 02/13/2015    Years since quitting: 7.2   Smokeless tobacco: Never  Vaping Use   Vaping Use: Never used  Substance and Sexual Activity   Alcohol use: Not Currently    Alcohol/week: 0.0 standard drinks of alcohol   Drug use: No   Sexual activity: Not on file  Other Topics Concern   Not on file  Social History Narrative   From [place], to [place] 2011   Widow as of 2001   1 son prev incarcerated, out as of 2019, in contact as of 2023   1 daughter   Lives  with significant other, Tim   Social Determinants of Health   Financial Resource Strain: Low Risk  (01/25/2022)   Overall Financial Resource Strain (CARDIA)    Difficulty of Paying Living Expenses: Not hard at all  Food Insecurity: No Food Insecurity (01/25/2022)   Hunger Vital Sign    Worried About Running Out of Food in the Last Year: Never true    Ran Out of Food in the Last Year: Never true  Transportation Needs: No Transportation Needs (01/25/2022)   PRAPARE - Hydrologist (Medical): No    Lack of Transportation (Non-Medical): No  Physical Activity: Sufficiently Active (01/25/2022)   Exercise Vital Sign    Days of Exercise per Week: 7 days    Minutes of Exercise per Session: 30 min  Stress: No Stress Concern Present (01/25/2022)   [place]    Feeling of Stress : Not at all  Social Connections: Moderately Isolated (01/25/2022)   Social Connection and Isolation Panel [NHANES]    Frequency of Communication with Friends and Family: Three times a week    Frequency of Social Gatherings with Friends and Family: Once a week    Attends Religious Services: More than 4 times per year    Active Member of [employer] or Organizations: No    Attends Music therapist: Never    Marital Status: Never married     Family History:  The patient's  family history includes Alcohol abuse in her father; Breast cancer (age of onset: 67) in her maternal grandmother; Coronary artery disease in her father and mother; Diabetes in her brother and mother; Heart attack in her father and mother; Heart disease in her father and mother; Hypertension in her brother and mother. There is no history of Colon cancer.  ROS:   ROS   EKGs/Labs/Other Studies Reviewed:    Studies reviewed were summarized above. The additional studies were reviewed today:  LHC 09/05/2019: Coronary dominance: Right/or codominant  Left mainstem:   Large vessel that bifurcates into the LAD and left circumflex, no significant disease noted  Left anterior descending (LAD):   Large vessel that extends to the apical region, diagonal branch 2 of moderate size, proximal LAD with 50% stenosis, unchanged from 2016  Left circumflex (LCx):  Large vessel with OM branch 2, patent stent in the mid to distal vessel 90% proximal left circumflex disease prior to takeoff of the OM1, progression from 60% stenosis in 2016 Distal left circumflex disease estimated 50-60%, beyond the mid to distal stent  Right coronary artery (RCA): Moderate sized right coronary artery, occluded in the mid vessel, collaterals from left to right Known to be occluded from prior catheterization 2016  Left ventriculography: Left ventricular systolic function is normal, LVEF is estimated at 55%, there is no significant mitral regurgitation , no significant aortic valve stenosis  Final Conclusions:   Occluded mid RCA, known from 2016 Severe stenosis of proximal left circumflex Moderate proximal LAD disease Moderate distal left circumflex disease, distal to the stent  Recommendations:  Case discussed with interventional cardiology We will plan for PCI of proximal left circumflex, likely culprit vessel Medical management of proximal LAD disease, distal left circumflex disease Strong recommendation for smoking  cessation   PCI: Prox Cx lesion is 90% stenosed. Previously placed Mid Cx to Dist Cx stent (unknown type) is widely patent. A drug-eluting stent was successfully placed using a [place] 2.77O24. Post intervention, there is a  0% residual stenosis.   1.  Vessel CAD with 90% stenosis proximal left circumflex 2.  Successful PCI with DES proximal left circumflex __________  2D echo 09/05/2019: 1. Left ventricular ejection fraction, by visual estimation, is 60 to  65%. The left ventricle has normal function. Normal left ventricular size.  There is no left ventricular hypertrophy.   2. Left ventricular diastolic Doppler parameters are consistent with  impaired relaxation pattern of LV diastolic filling.   3. Global right ventricle has normal systolic function.The right  ventricular size is normal. No increase in right ventricular wall  thickness.   4. Left atrial size was mildly dilated.   5. Moderately elevated pulmonary artery systolic pressure. 45 mm Hg   6. Definity contrast agent was given IV to delineate the left ventricular  endocardial borders. ___________  2D echo 12/01/2016: - Left ventricle: The cavity size was normal. Wall thickness was    increased in a pattern of mild LVH. Systolic function was normal.    The estimated ejection fraction was in the range of 60% to 65%.    Wall motion was normal; there were no regional wall motion    abnormalities. Doppler parameters are consistent with abnormal    left ventricular relaxation (grade 1 diastolic dysfunction).  - Mitral valve: Calcified annulus.  - Left atrium: The atrium was mildly dilated.  - Pericardium, extracardiac: A trivial pericardial effusion was    identified. __________  Carlton Adam MPI 11/17/2016: Pharmacological myocardial perfusion imaging study with small mild region of ischemia noted in the mid to apical lateral wall Unable to definatively exclude attenuation artifact given high uptake in the  inferoseptal wall.  Normal wall motion, EF estimated at 83% No EKG changes concerning for ischemia at peak stress or in recovery. Low risk scan given very small mild perfusion defect. __________  LHC 04/30/2015: Mid RCA lesion, 100% stenosed. Prox Cx lesion, 50% stenosed. Prox LAD lesion, 60% stenosed. Ost 1st Diag to 1st Diag lesion, 60% stenosed. Mid Cx to Dist Cx lesion, 90% stenosed. There is a 0% residual stenosis post intervention. A drug-eluting stent was placed.   1. Severe one-vessel coronary artery disease involving the mid left circumflex. Moderate proximal LAD stenosis not significant by FFR (0.83). Occluded small codominant mid RCA with left-to-right collaterals. 2. Normal LV systolic function by noninvasive testing. Mildly elevated left ventricular end-diastolic pressure. 3. Successful angioplasty and drug-eluting stent placement to the mid left circumflex.   Recommendations: Dual antiplatelets therapy for at least one year. Aggressive treatment of risk factors. __________  Jodie Echevaria MPI 04/22/2015: This is an intermediate risk study. Defect 1: There is a medium defect of moderate severity present in the basal inferolateral, mid inferolateral, apical inferior and apical lateral location. Findings consistent with ischemia. There was no ST segment deviation noted during stress. T wave inversion of 3 mm was noted during stress in the lateral leads (V5, V6 and V2), beginning at 0 minutes of stress, ending at 1 minutes of stress, and returning to baseline after 1-5 mins of recovery. asymmetric The left ventricular ejection fraction is normal (55-65%).   If Clinical Conditions warrant, would recommend Cardiac Catheterization. __________  2D echo 04/23/2013: - Left ventricle: The cavity size was normal. Wall thickness    was increased in a pattern of mild LVH. The estimated    ejection fraction was 60%. Wall motion was normal; there    were no regional wall motion abnormalities.  Findings    consistent with left ventricular diastolic dysfunction.  -  Aortic valve: Sclerosis without stenosis. Mild    regurgitation.  - Mitral valve: Mildly to moderately calcified annulus.  - Tricuspid valve: Mild regurgitation.  - Pulmonary arteries: PA peak pressure: 48m Hg (S).   EKG:  EKG is ordered today.  The EKG ordered today demonstrates ***  Recent Labs: 02/28/2022: ALT 37; BUN 15; Creatinine, Ser 0.81; Hemoglobin 11.7; Platelets 217.0; Potassium 4.8; Sodium 135  Recent Lipid Panel    Component Value Date/Time   CHOL 141 02/28/2022 1318   CHOL 206 (H) 11/16/2016 1456   TRIG 125.0 02/28/2022 1318   HDL 44.20 02/28/2022 1318   HDL 54 11/16/2016 1456   CHOLHDL 3 02/28/2022 1318   VLDL 25.0 02/28/2022 1318   LDLCALC 72 02/28/2022 1318   LDLCALC 118 (H) 11/16/2016 1456    PHYSICAL EXAM:    VS:  There were no vitals taken for this visit.  BMI: There is no height or weight on file to calculate BMI.  Physical Exam  Wt Readings from Last 3 Encounters:  02/28/22 239 lb (108.4 kg)  01/25/22 217 lb (98.4 kg)  02/22/21 217 lb (98.4 kg)     ASSESSMENT & PLAN:   CAD involving the native coronary arteries without***angina:  HTN: Blood pressure  HLD: LDL  Aortic valve sclerosis:  Abnormal LFTs: Right upper quadrant ultrasound in 03/2022 with noted increased echogenicity of the liver parenchyma possibly reflecting hepatic steatosis versus other hepatocellular process.   {Are you ordering a CV Procedure (e.g. stress test, cath, DCCV, TEE, etc)?   Press F2        :[id number]}    Disposition: F/u with Dr. AFletcher Anonor an APP in ***.   Medication Adjustments/Labs and Tests Ordered: Current medicines are reviewed at length with the patient today.  Concerns regarding medicines are outlined above. Medication changes, Labs and Tests ordered today are summarized above and listed in the Patient Instructions accessible in Encounters.   Signed, RChristell Faith PA-C 05/23/2022 11:10  AM     [facility] [address] [place] [phone]

## 2022-05-27 ENCOUNTER — Other Ambulatory Visit: Payer: Self-pay | Admitting: Family Medicine

## 2022-05-27 ENCOUNTER — Ambulatory Visit: Payer: Medicare HMO | Admitting: Physician Assistant

## 2022-06-02 NOTE — Progress Notes (Signed)
Cardiology Office Note    Date:  06/03/2022   ID:  Rae Lips, DOB [birth year], MRN [id number]  PCP:  Tonia Ghent, MD  Cardiologist:  Kathlyn Sacramento, MD  Electrophysiologist:  None   Chief Complaint: Follow-up  History of Present Illness:   Andrea Webster is a 66 y.o. female with history of CAD with MI at age 74 status post PTCA to unknown vessel status post DES to the LCx in 2016 and 2020, HTN, HLD intolerant to high intensity statins as well as abnormal LFTs noted on atorvastatin, strong family history of CAD, iron deficiency anemia, prior tobacco use until 01/2015, obesity, and GERD who presents for follow-up of CAD.  LHC in 04/2015 showed CTO of the RCA, severe mid LCx disease with 90% stenosis, and moderate proximal LAD disease.  FFR within the LAD was nonsignificant at 0.83.  The LCx was felt to be the culprit vessel and was treated successfully with PCI/DES.  Lexiscan MPI in 10/2016 showed a small mild region of ischemia noted in the mid to apical lateral wall, unable to exclude attenuation artifact, and was overall low risk given very small mild perfusion defect.  Echo in 11/2016 demonstrated an EF of 60 to 65%, no regional wall motion abnormalities, grade 1 diastolic dysfunction, calcified mitral annulus, mildly dilated left atrium, and trivial pericardial effusion.  She was admitted to the hospital in 08/2019 with unstable angina.  Echo showed an EF of 60 to 65% with grade 1 diastolic dysfunction, mild aortic valve sclerosis without evidence of stenosis, and moderate pulmonary hypertension with a PASP of 45 mmHg.  LHC showed known CTO of the RCA, moderate proximal LAD disease, patent LCx stent with severe proximal stenosis and moderate distal disease.  She underwent successful PCI/DES to the proximal LCx.  She was last seen in the office in 01/2021 and was doing reasonably well.  Right upper quadrant ultrasound in 03/2022, performed for elevated LFTs, demonstrated increased  echogenicity of the liver parenchyma possibly representing hepatic steatosis versus other hepatocellular disease.  She comes in doing well from a cardiac perspective and is without symptoms of angina or decompensation.  No dizziness, presyncope, or syncope.  No significant lower extremity swelling or progressive orthopnea.  She is adherent to her cardiac medications.   Labs independently reviewed: 02/2022 - TC 141, TG 125, HDL 44, LDL 72, potassium 4.8, BUN 15, SCr 0.81, albumin 4.3, AST 41, ALT 37, HGB 11.7, PLT 217 05/2019 - A1c 5.9 03/2015 - TSH normal    Past Medical History:  Diagnosis Date   Anxiety    Arthritis    Baker's cyst    x 2   CAD (coronary artery disease)    a. s/p MI & PTCA - age 26;  b. 04/2015 PCI: LM nl, LAD 60p (FFR 0.83), D1 60ost, LCX 50p, 103m(2.75x15 Xience Alpine DES), OM1/2/3 min irregs, RCA 1050mTO; c. 08/2019 PCI: LM nl, LAD mod prox dzs, LCX patent stent w/ severe prox dzs (DES), RCA CTO mid.   Chronic sciatica    Complication of anesthesia    Constipation    Depression    intolerant of paxil, wellbutrin, zoloft   Diastolic dysfunction    a. 08/2019 Echo: EF 60-65%, gr1 DD, PASP 4533m.   GERD (gastroesophageal reflux disease)    Hayfever    headaches    Heart disease    Heart murmur    Heart valve disease    High cholesterol    Hypertension  Incisional hernia    Insomnia    Myocardial infarction ([place]) 08/2019   stented. first MI at age 15   Plantar fasciitis    Primary insomnia    Rectal polyp 03/2020   RLS (restless legs syndrome)    Sciatica    Sleep apnea    snores very loudly   Smoker    a. quit 01/2014.    Past Surgical History:  Procedure Laterality Date   [place]   open   [facility] [place]   CARDIAC CATHETERIZATION N/A 04/30/2015   Procedure: Left Heart Cath;  Surgeon: Wellington Hampshire, MD;  Location: [place] [facility];  Service: Cardiovascular;   Laterality: N/A;   CARPAL TUNNEL RELEASE     [place]   CHOLECYSTECTOMY N/A 03/30/2016   Procedure: LAPAROSCOPIC CHOLECYSTECTOMY WITH INTRAOPERATIVE CHOLANGIOGRAM;  Surgeon: Jules Husbands, MD;  Location: [facility];  Service: General;  Laterality: N/A;   COLONOSCOPY WITH PROPOFOL N/A 06/26/2020   Procedure: COLONOSCOPY WITH PROPOFOL;  Surgeon: Robert Bellow, MD;  Location: [facility];  Service: Endoscopy;  Laterality: N/A;   [place]    CORONARY [facility]   CORONARY STENT INTERVENTION N/A 09/05/2019   Procedure: CORONARY STENT INTERVENTION;  Surgeon: Isaias Cowman, MD;  Location: [place] [facility];  Service: Cardiovascular;  Laterality: N/A;   ESOPHAGOGASTRODUODENOSCOPY (EGD) WITH PROPOFOL N/A 06/26/2020   Procedure: ESOPHAGOGASTRODUODENOSCOPY (EGD) WITH PROPOFOL;  Surgeon: Robert Bellow, MD;  Location: [facility];  Service: Endoscopy;  Laterality: N/A;   EXCISION OF MESH N/A 04/29/2020   Procedure: EXCISION OF MESH;  Surgeon: Robert Bellow, MD;  Location: [facility];  Service: General;  Laterality: N/A;   HERNIA REPAIR  11/2016   [facility]; Dr. Gaynelle Arabian   LEFT HEART CATH AND CORONARY ANGIOGRAPHY N/A 09/05/2019   Procedure: LEFT HEART CATH AND CORONARY ANGIOGRAPHY;  Surgeon: Minna Merritts, MD;  Location: [place] [facility];  Service: Cardiovascular;  Laterality: N/A;   VAGINAL HYSTERECTOMY     VENTRAL HERNIA REPAIR N/A 04/29/2020   Procedure: HERNIA REPAIR VENTRAL ADULT;  Surgeon: Robert Bellow, MD;  Location: [facility];  Service: General;  Laterality: N/A;  Ventral hernia repair with component separation- General anesthesia and exparel block; Arvilla Meres, RNFA to assist    Current Medications: Current Meds  Medication Sig   albuterol (VENTOLIN HFA) 108 (90 Base) MCG/ACT inhaler INHALE 2 PUFFS EVERY 6  HOURS AS NEEDED  FOR WHEEZING OR SHORTNESS OF BREATH.   ALPRAZolam (XANAX) 1 MG tablet TAKE 1 TABLET THREE TIMES DAILY AS NEEDED FOR ANXIETY   aspirin 81 MG EC tablet Take 81 mg by mouth daily.    atorvastatin (LIPITOR) 20 MG tablet TAKE 1 TABLET EVERY DAY   cetirizine (ZYRTEC) 10 MG tablet Take 10 mg by mouth daily.   Cholecalciferol (VITAMIN D) 50 MCG (2000 UT) CAPS Take 2,000 Units by mouth daily.    cyclobenzaprine (FLEXERIL) 10 MG tablet TAKE 1/2 TO 1 TABLET AT BEDTIME   ezetimibe (ZETIA) 10 MG tablet TAKE 1 TABLET EVERY DAY   fluticasone (FLONASE) 50 MCG/ACT nasal spray USE 2 SPRAYS IN EACH NOSTRIL ONE TIME DAILY   isosorbide mononitrate (IMDUR) 60 MG 24 hr tablet TAKE 1 TABLET EVERY DAY  ketotifen (ZADITOR) 0.025 % ophthalmic solution Place 1 drop into both eyes 2 (two) times daily.   metoprolol tartrate (LOPRESSOR) 25 MG tablet TAKE 1 TABLET TWICE DAILY   Multiple Vitamins-Minerals (MULTIVITAMIN WITH MINERALS) tablet Take 1 tablet by mouth daily.   nitroGLYCERIN (NITROSTAT) 0.4 MG SL tablet DISSOLVE 1 TABLET UNDER THE TONGUE EVERY 5 MINUTES AS NEEDED FOR CHEST PAIN AS DIRECTED   NYSTATIN powder APPLY  POWDER TOPICALLY THREE TIMES DAILY   pantoprazole (PROTONIX) 40 MG tablet TAKE 1 TABLET EVERY DAY   senna (SENOKOT) 8.6 MG tablet Take 1 tablet by mouth at bedtime. 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    Comment: disabled  Tobacco Use   Smoking status: Former    Packs/day: 0.33    Years: 20.00    Total pack years: 6.60    Types: Cigarettes    Quit date:  02/13/2015    Years since quitting: 7.3   Smokeless tobacco: Never  Vaping Use   Vaping Use: Never used  Substance and Sexual Activity   Alcohol use: Not Currently    Alcohol/week: 0.0 standard drinks of alcohol   Drug use: No   Sexual activity: Not on file  Other Topics Concern   Not on file  Social History Narrative   From [place], to [place] 2011   Widow as of 2001   1 son prev incarcerated, out as of 2019, in contact as of 2023   1 daughter   Lives with significant other, Tim   Social Determinants of Health   Financial Resource Strain: [place]  (01/25/2022)   Overall Financial Resource Strain (CARDIA)    Difficulty of Paying Living Expenses: Not hard at all  Food Insecurity: No Food Insecurity (01/25/2022)   Hunger Vital Sign    Worried About Running Out of Food in the Last Year: Never true    [place] in the Last Year: Never true  Transportation Needs: No Transportation Needs (01/25/2022)   PRAPARE - Hydrologist (Medical): No    Lack of Transportation (Non-Medical): No  Physical Activity: Sufficiently Active (01/25/2022)   Exercise Vital Sign    Days of Exercise per Week: 7 days    Minutes of Exercise per Session: 30 min  Stress: No Stress Concern Present (01/25/2022)   [place]    Feeling of Stress : Not at all  Social Connections: Moderately Isolated (01/25/2022)   Social Connection and Isolation Panel [NHANES]    Frequency of Communication with Friends and Family: Three times a week    Frequency of Social Gatherings with Friends and Family: Once a week    Attends Religious Services: More than 4 times per year    Active Member of [employer] or Organizations: No    Attends Archivist Meetings: Never    Marital Status: Never married     Family History:  The patient's family history includes Alcohol abuse in her father; Breast cancer (age of onset: 32) in  her  maternal grandmother; Coronary artery disease in her father and mother; Diabetes in her brother and mother; Heart attack in her father and mother; Heart disease in her father and mother; Hypertension in her brother and mother. There is no history of Colon cancer.  ROS:   12-point review of systems is negative unless otherwise noted in HPI   EKGs/Labs/Other Studies Reviewed:    Studies reviewed were summarized above. The additional studies were reviewed today:  LHC 09/05/2019: Coronary dominance: Right/or codominant  Left mainstem:   Large vessel that bifurcates into the LAD and left circumflex, no significant disease noted  Left anterior descending (LAD):   Large vessel that extends to the apical region, diagonal branch 2 of moderate size, proximal LAD with 50% stenosis, unchanged from 2016  Left circumflex (LCx):  Large vessel with OM branch 2, patent stent in the mid to distal vessel 90% proximal left circumflex disease prior to takeoff of the OM1, progression from 60% stenosis in 2016 Distal left circumflex disease estimated 50-60%, beyond the mid to distal stent  Right coronary artery (RCA): Moderate sized right coronary artery, occluded in the mid vessel, collaterals from left to right Known to be occluded from prior catheterization 2016  Left ventriculography: Left ventricular systolic function is normal, LVEF is estimated at 55%, there is no significant mitral regurgitation , no significant aortic valve stenosis  Final Conclusions:   Occluded mid RCA, known from 2016 Severe stenosis of proximal left circumflex Moderate proximal LAD disease Moderate distal left circumflex disease, distal to the stent  Recommendations:  Case discussed with interventional cardiology We will plan for PCI of proximal left circumflex, likely culprit vessel Medical management of proximal LAD disease, distal left circumflex disease Strong recommendation for smoking cessation   PCI: Prox Cx  lesion is 90% stenosed. Previously placed Mid Cx to Dist Cx stent (unknown type) is widely patent. A drug-eluting stent was successfully placed using a [place] [id number]. Post intervention, there is a 0% residual stenosis.   1.  Vessel CAD with 90% stenosis proximal left circumflex 2.  Successful PCI with DES proximal left circumflex __________  2D echo 09/05/2019: 1. Left ventricular ejection fraction, by visual estimation, is 60 to  65%. The left ventricle has normal function. Normal left ventricular size.  There is no left ventricular hypertrophy.   2. Left ventricular diastolic Doppler parameters are consistent with  impaired relaxation pattern of LV diastolic filling.   3. Global right ventricle has normal systolic function.The right  ventricular size is normal. No increase in right ventricular wall  thickness.   4. Left atrial size was mildly dilated.   5. Moderately elevated pulmonary artery systolic pressure. 45 mm Hg   6. Definity contrast agent was given IV to delineate the left ventricular  endocardial borders. ___________  2D echo 12/01/2016: - Left ventricle: The cavity size was normal. Wall thickness was    increased in a pattern of mild LVH. Systolic function was normal.    The estimated ejection fraction was in the range of 60% to 65%.    Wall motion was normal; there were no regional wall motion    abnormalities. Doppler parameters are consistent with abnormal    left ventricular relaxation (grade 1 diastolic dysfunction).  - Mitral valve: Calcified annulus.  - Left atrium: The atrium was mildly dilated.  - Pericardium, extracardiac: A trivial pericardial effusion was    identified. __________  Carlton Adam MPI 11/17/2016: Pharmacological myocardial perfusion imaging study with small mild  region of ischemia noted in the mid to apical lateral wall Unable to definatively exclude attenuation artifact given high uptake in the inferoseptal wall.  Normal wall  motion, EF estimated at 83% No EKG changes concerning for ischemia at peak stress or in recovery. Low risk scan given very small mild perfusion defect. __________  LHC 04/30/2015: Mid RCA lesion, 100% stenosed. Prox Cx lesion, 50% stenosed. Prox LAD lesion, 60% stenosed. Ost 1st Diag to 1st Diag lesion, 60% stenosed. Mid Cx to Dist Cx lesion, 90% stenosed. There is a 0% residual stenosis post intervention. A drug-eluting stent was placed.   1. Severe one-vessel coronary artery disease involving the mid left circumflex. Moderate proximal LAD stenosis not significant by FFR (0.83). Occluded small codominant mid RCA with left-to-right collaterals. 2. Normal LV systolic function by noninvasive testing. Mildly elevated left ventricular end-diastolic pressure. 3. Successful angioplasty and drug-eluting stent placement to the mid left circumflex.   Recommendations: Dual antiplatelets therapy for at least one year. Aggressive treatment of risk factors. __________  Jodie Echevaria MPI 04/22/2015: This is an intermediate risk study. Defect 1: There is a medium defect of moderate severity present in the basal inferolateral, mid inferolateral, apical inferior and apical lateral location. Findings consistent with ischemia. There was no ST segment deviation noted during stress. T wave inversion of 3 mm was noted during stress in the lateral leads (V5, V6 and V2), beginning at 0 minutes of stress, ending at 1 minutes of stress, and returning to baseline after 1-5 mins of recovery. asymmetric The left ventricular ejection fraction is normal (55-65%).   If Clinical Conditions warrant, would recommend Cardiac Catheterization. __________  2D echo 04/23/2013: - Left ventricle: The cavity size was normal. Wall thickness    was increased in a pattern of mild LVH. The estimated    ejection fraction was 60%. Wall motion was normal; there    were no regional wall motion abnormalities. Findings    consistent with left  ventricular diastolic dysfunction.  - Aortic valve: Sclerosis without stenosis. Mild    regurgitation.  - Mitral valve: Mildly to moderately calcified annulus.  - Tricuspid valve: Mild regurgitation.  - Pulmonary arteries: PA peak pressure: 34m Hg (S).   EKG:  EKG is ordered today.  The EKG ordered today demonstrates NSR, 77 bpm, RBBB, known  Recent Labs: 02/28/2022: BUN 15; Creatinine, Ser 0.81; Hemoglobin 11.7; Platelets 217.0; Potassium 4.8; Sodium 135 06/03/2022: ALT 41  Recent Lipid Panel    Component Value Date/Time   CHOL 141 02/28/2022 1318   CHOL 206 (H) 11/16/2016 1456   TRIG 125.0 02/28/2022 1318   HDL 44.20 02/28/2022 1318   HDL 54 11/16/2016 1456   CHOLHDL 3 02/28/2022 1318   VLDL 25.0 02/28/2022 1318   LDLCALC 72 02/28/2022 1318   LDLCALC 118 (H) 11/16/2016 1456    PHYSICAL EXAM:    VS:  BP 140/70 (BP Location: Left Arm, Patient Position: Sitting, Cuff Size: Large)   Pulse 77   Ht 4' 11.5" (1.511 m)   Wt 244 lb (110.7 kg)   SpO2 98%   BMI 48.46 kg/m   BMI: Body mass index is 48.46 kg/m.  Physical Exam Constitutional:      Appearance: She is well-developed.  HENT:     Head: Normocephalic and atraumatic.  Eyes:     General:        Right eye: No discharge.        Left eye: No discharge.  Neck:     Vascular: No  JVD.  Cardiovascular:     Rate and Rhythm: Normal rate and regular rhythm.     Pulses:          Posterior tibial pulses are 2+ on the right side and 2+ on the left side.     Heart sounds: S1 normal and S2 normal. Heart sounds not distant. No midsystolic click and no opening snap. Murmur heard.     Systolic murmur is present with a grade of 1/6 at the upper right sternal border.     No friction rub.  Pulmonary:     Effort: Pulmonary effort is normal. No respiratory distress.     Breath sounds: Normal breath sounds. No decreased breath sounds, wheezing or rales.  Chest:     Chest wall: No tenderness.  Abdominal:     General: There is no  distension.  Musculoskeletal:     Cervical back: Normal range of motion.     Right lower leg: No edema.     Left lower leg: No edema.  Skin:    General: Skin is warm and dry.     Nails: There is no clubbing.  Neurological:     Mental Status: She is alert and oriented to person, place, and time.  Psychiatric:        Speech: Speech normal.        Behavior: Behavior normal.        Thought Content: Thought content normal.        Judgment: Judgment normal.     Wt Readings from Last 3 Encounters:  06/03/22 244 lb (110.7 kg)  02/28/22 239 lb (108.4 kg)  01/25/22 217 lb (98.4 kg)     ASSESSMENT & PLAN:   CAD involving the native coronary arteries without angina: She is doing well without symptoms concerning for angina or decompensation.  Continue aggressive risk factor modification and secondary prevention including aspirin, ezetimibe, Imdur, and metoprolol.  No indication for further ischemic testing at this time.  HTN: Blood pressure is mildly elevated in the office today, though typically well controlled.  No changes were made.  Continue to monitor.  HLD: LDL 72 in 02/2022 with mildly elevated AST/ALT at that time.  She remains on atorvastatin 20 mg and ezetimibe 10 mg.  Trend LFTs as outlined below with recommendation to hold atorvastatin if LFTs remain elevated.  Intolerant to rosuvastatin secondary to aches.  Pending trend of LFT, may need to consider PCSK9 inhibitor.  Aortic valve sclerosis: No evidence of stenosis from echo in 08/2019.  Abnormal LFTs: Right upper quadrant ultrasound in 03/2022 with noted increased echogenicity of the liver parenchyma possibly reflecting hepatic steatosis versus other hepatocellular process.  She has previously been noted to have elevated LFTs while on high-dose atorvastatin.  Currently, she is on atorvastatin 20 mg along with ezetimibe 10 mg.  Trend LFTs today.  If they remain elevated would recommend undergoing a trial of holding atorvastatin with  continuation of ezetimibe followed by repeat LFTs in approximately 8 weeks.  If her LFTs improve with that, would recommend referral to the lipid clinic for consideration of PCSK9 inhibitor.    Disposition: F/u with Dr. Fletcher Anon or an APP in 72-month   Medication Adjustments/Labs and Tests Ordered: Current medicines are reviewed at length with the patient today.  Concerns regarding medicines are outlined above. Medication changes, Labs and Tests ordered today are summarized above and listed in the Patient Instructions accessible in Encounters.   Signed,Christell Faith PA-C 06/03/2022 3:42 PM  [address] [place], [place] [address] [phone]

## 2022-06-03 ENCOUNTER — Other Ambulatory Visit
Admission: RE | Admit: 2022-06-03 | Discharge: 2022-06-03 | Disposition: A | Discharge status: Home / Self Care | Payer: Medicare HMO | Attending: Physician Assistant | Admitting: Physician Assistant

## 2022-06-03 ENCOUNTER — Encounter: Payer: Self-pay | Admitting: Physician Assistant

## 2022-06-03 ENCOUNTER — Telehealth: Payer: Self-pay | Admitting: *Deleted

## 2022-06-03 ENCOUNTER — Ambulatory Visit (INDEPENDENT_AMBULATORY_CARE_PROVIDER_SITE_OTHER): Payer: Medicare HMO | Admitting: Physician Assistant

## 2022-06-03 VITALS — BP 140/70 | HR 77 | Ht 59.5 in | Wt 244.0 lb

## 2022-06-03 DIAGNOSIS — R7989 Other specified abnormal findings of blood chemistry: Secondary | ICD-10-CM

## 2022-06-03 DIAGNOSIS — I358 Other nonrheumatic aortic valve disorders: Secondary | ICD-10-CM

## 2022-06-03 DIAGNOSIS — I2510 Atherosclerotic heart disease of native coronary artery without angina pectoris: Secondary | ICD-10-CM | POA: Diagnosis not present

## 2022-06-03 DIAGNOSIS — I10 Essential (primary) hypertension: Secondary | ICD-10-CM

## 2022-06-03 DIAGNOSIS — E785 Hyperlipidemia, unspecified: Secondary | ICD-10-CM

## 2022-06-03 LAB — HEPATIC FUNCTION PANEL
ALT: 41 U/L (ref 0–44)
AST: 56 U/L — ABNORMAL HIGH (ref 15–41)
Albumin: 4.1 g/dL (ref 3.5–5.0)
Alkaline Phosphatase: 114 U/L (ref 38–126)
Bilirubin, Direct: 0.2 mg/dL (ref 0.0–0.2)
Indirect Bilirubin: 1.3 mg/dL — ABNORMAL HIGH (ref 0.3–0.9)
Total Bilirubin: 1.5 mg/dL — ABNORMAL HIGH (ref 0.3–1.2)
Total Protein: 7.9 g/dL (ref 6.5–8.1)

## 2022-06-03 NOTE — Patient Instructions (Signed)
Medication Instructions:  No changes at this time.   *If you need a refill on your cardiac medications before your next appointment, please call your pharmacy*   Lab Work: LFT today over at the [place] [facility] 1st desk on the right to check in (REGISTRATION)  Lab hours: Monday- Friday (7:30 am- 5:30 pm)   If you have labs (blood work) drawn today and your tests are completely normal, you will receive your results only by: MyChart Message (if you have MyChart) OR A paper copy in the mail If you have any lab test that is abnormal or we need to change your treatment, we will call you to review the results.   Testing/Procedures: None   Follow-Up: At [facility], you and your health needs are our priority.  As part of our continuing mission to provide you with exceptional heart care, we have created designated Provider Care Teams.  These Care Teams include your primary Cardiologist (physician) and Advanced Practice Providers (APPs -  Physician Assistants and Nurse Practitioners) who all work together to provide you with the care you need, when you need it.  Your next appointment:   4 month(s)  The format for your next appointment:   In Person  Provider:   Kathlyn Sacramento, MD or Christell Faith, PA-C        Important Information About Sugar

## 2022-06-03 NOTE — Telephone Encounter (Signed)
-----   Message from Rise Mu, PA-C sent at 06/03/2022  3:43 PM EDT ----- Please inform the patient her AST remains elevated.  ALT is upper limits of normal.  Recommend she hold atorvastatin.  Continue ezetimibe.  Follow-up LFTs in 2 months.

## 2022-06-03 NOTE — Telephone Encounter (Signed)
Left voicemail message to call back for review of results.  

## 2022-06-03 NOTE — Telephone Encounter (Signed)
Results and recommendations reviewed with patient and she verbalized understanding with no further questions at this time.

## 2022-08-03 ENCOUNTER — Other Ambulatory Visit: Payer: Self-pay | Admitting: Family Medicine

## 2022-08-04 NOTE — Telephone Encounter (Signed)
Refill request for ALPRAZOLAM 1 MG Tablet  LOV - 02/28/22 Next OV - not scheduled Last refill - 03/06/22 #90/1  *Ambien needs to be refused*

## 2022-09-16 ENCOUNTER — Ambulatory Visit (INDEPENDENT_AMBULATORY_CARE_PROVIDER_SITE_OTHER): Payer: Medicare HMO

## 2022-09-16 DIAGNOSIS — Z23 Encounter for immunization: Secondary | ICD-10-CM

## 2022-09-27 ENCOUNTER — Other Ambulatory Visit: Payer: Self-pay | Admitting: Family Medicine

## 2022-09-27 MED ORDER — ZOLPIDEM TARTRATE 10 MG PO TABS: 10.0000 mg | ORAL_TABLET | Freq: Every evening | ORAL | 0 refills | Status: DC | PRN

## 2022-09-27 NOTE — Progress Notes (Signed)
D/w pt about insomnia, ie retrial of ambien. She'll update me as needed, if ambien doesn't help.

## 2022-09-30 NOTE — Progress Notes (Deleted)
Cardiology Office Note    Date:  09/30/2022   ID:  Andrea Webster, DOB [birth year], MRN [id number]  PCP:  Tonia Ghent, MD  Cardiologist:  Kathlyn Sacramento, MD  Electrophysiologist:  None   Chief Complaint: Follow-up  History of Present Illness:   Andrea Webster is a 66 y.o. female with history of CAD with MI at age 11 status post PTCA to unknown vessel status post DES to the LCx in 2016 and 2020, HTN, HLD intolerant to high intensity statins as well as abnormal LFTs noted on atorvastatin, strong family history of CAD, iron deficiency anemia, prior tobacco use until 01/2015, obesity, and GERD who presents for follow-up of CAD.   LHC in 04/2015 showed CTO of the RCA, severe mid LCx disease with 90% stenosis, and moderate proximal LAD disease.  FFR within the LAD was nonsignificant at 0.83.  The LCx was felt to be the culprit vessel and was treated successfully with PCI/DES.  Lexiscan MPI in 10/2016 showed a small mild region of ischemia noted in the mid to apical lateral wall, unable to exclude attenuation artifact, and was overall low risk given very small mild perfusion defect.  Echo in 11/2016 demonstrated an EF of 60 to 65%, no regional wall motion abnormalities, grade 1 diastolic dysfunction, calcified mitral annulus, mildly dilated left atrium, and trivial pericardial effusion.  She was admitted to the hospital in 08/2019 with unstable angina.  Echo showed an EF of 60 to 65% with grade 1 diastolic dysfunction, mild aortic valve sclerosis without evidence of stenosis, and moderate pulmonary hypertension with a PASP of 45 mmHg.  LHC showed known CTO of the RCA, moderate proximal LAD disease, patent LCx stent with severe proximal stenosis and moderate distal disease.  She underwent successful PCI/DES to the proximal LCx.     Right upper quadrant ultrasound in 03/2022, performed for elevated LFTs, demonstrated increased echogenicity of the liver parenchyma possibly representing hepatic steatosis  versus other hepatocellular disease.  She was last seen in the office in 05/2022 and was without symptoms of angina or decompensation.  ***   Labs independently reviewed: 05/2022 - albumin 4.1, AST 56, ALT normal 02/2022 - TC 141, TG 125, HDL 44, LDL 72, potassium 4.8, BUN 15, SCr 0.81, HGB 11.7, PLT 217 05/2019 - A1c 5.9 03/2015 - TSH normal  Past Medical History:  Diagnosis Date   Anxiety    Arthritis    Baker's cyst    x 2   CAD (coronary artery disease)    a. s/p MI & PTCA - age 85;  b. 04/2015 PCI: LM nl, LAD 60p (FFR 0.83), D1 60ost, LCX 50p, 65m(2.75x15 Xience [place]pine DES), OM1/2/3 min irregs, RCA 1073mTO; c. 08/2019 PCI: LM nl, LAD mod prox dzs, LCX patent stent w/ severe prox dzs (DES), RCA CTO mid.   Chronic sciatica    Complication of anesthesia    Constipation    Depression    intolerant of paxil, wellbutrin, zoloft   Diastolic dysfunction    a. 08/2019 Echo: EF 60-65%, gr1 DD, PASP 4513m.   GERD (gastroesophageal reflux disease)    Hayfever    headaches    Heart disease    Heart murmur    Heart valve disease    High cholesterol    Hypertension    Incisional hernia    Insomnia    [facility]ocardial infarction ([facility]/2020   stented. first MI at age 48 73Plantar fasciitis    Primary  insomnia    Rectal polyp 03/2020   RLS (restless legs syndrome)    Sciatica    Sleep apnea    snores very loudly   Smoker    a. quit 01/2014.    Past Surgical History:  Procedure Laterality Date   [place]   open   [place]   CARDIAC CATHETERIZATION N/A 04/30/2015   Procedure: Left Heart Cath;  Surgeon: Wellington Hampshire, MD;  Location: [facility];  Service: Cardiovascular;  Laterality: N/A;   CARPAL TUNNEL RELEASE     [place]   CHOLECYSTECTOMY N/A 03/30/2016   Procedure: LAPAROSCOPIC CHOLECYSTECTOMY WITH INTRAOPERATIVE CHOLANGIOGRAM;  Surgeon: Jules Husbands, MD;  Location: [facility];  Service: General;  Laterality: N/A;   COLONOSCOPY WITH PROPOFOL N/A 06/26/2020   Procedure: COLONOSCOPY WITH PROPOFOL;  Surgeon: Robert Bellow, MD;  Location: [facility];  Service: Endoscopy;  Laterality: N/A;   [place]    CORONARY ANGIOPLAST[facility] WITH [facility]  [facility]   CORONARY STENT INTERVENTION N/A 09/05/2019   Procedure: CORONARY STENT INTERVENTION;  Surgeon: Isaias Cowman, MD;  Location: [facility];  Service: Cardiovascular;  Laterality: N/A;   ESOPHAGOGASTRODUODENOSCOPY (EGD) WITH PROPOFOL N/A 06/26/2020   Procedure: ESOPHAGOGASTRODUODENOSCOPY (EGD) WITH PROPOFOL;  Surgeon: Robert Bellow, MD;  Location: [facility];  Service: Endoscopy;  Laterality: N/A;   EXCISION OF MESH N/A 04/29/2020   Procedure: EXCISION OF MESH;  Surgeon: Robert Bellow, MD;  Location: [facility];  Service: General;  Laterality: N/A;   HERNIA REPAIR  11/2016   [facility]; Dr. Gaynelle Arabian   LEFT HEART CATH AND CORONARY ANGIOGRAPHY N/A 09/05/2019   Procedure: LEFT HEART CATH AND CORONARY ANGIOGRAPHY;  Surgeon: Minna Merritts, MD;  Location: [facility];  Service: Cardiovascular;  Laterality: N/A;   VAGINAL HYSTERECTOMY     VENTRAL HERNIA REPAIR N/A 04/29/2020   Procedure: HERNIA REPAIR VENTRAL ADULT;  Surgeon: Robert Bellow, MD;  Location: [facility];  Service: General;  Laterality: N/A;  Ventral hernia repair with component separation- General anesthesia and exparel block; Arvilla Meres, RNFA to assist    Current Medications: No outpatient medications have been marked as taking for the 10/04/22 encounter (Appointment) with Rise Mu, PA-C.    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    Comment: disabled  Tobacco Use   Smoking status: Former    Packs/day: 0.33    Years: 20.00    Total pack years: 6.60    Types: Cigarettes    Quit date: 02/13/2015    Years since quitting: 7.6   Smokeless tobacco: Never  Vaping Use   Vaping Use: Never used  Substance and Sexual Activity   Alcohol use: Not Currently    Alcohol/week: 0.0 standard drinks of alcohol   Drug use: No   Sexual activity: Not on  file  Other Topics Concern   Not on file  Social History Narrative   From [place], to [place] 2011   Widow as of 2001   1 son prev incarcerated, out as of 2019, in contact as of 2023   1 daughter   Lives with significant other, Tim   Social Determinants of Health   Financial Resource Strain: Low Risk  (01/25/2022)   Overall Financial Resource Strain (CARDIA)    Difficulty of Paying Living Expenses: Not hard at all  Food Insecurity: No Food Insecurity (01/25/2022)   Hunger Vital Sign    Worried About Running Out of Food in the Last Year: Never true    [place] in the Last Year: Never true  Transportation Needs: No Transportation Needs (01/25/2022)   PRAPARE - Hydrologist (Medical): No    Lack of Transportation (Non-Medical): No  Physical Activity: Sufficiently Active (01/25/2022)   Exercise Vital Sign    Days of Exercise per Week: 7 days    Minutes of Exercise per Session: 30 min  Stress: No Stress Concern Present (01/25/2022)   [place]    Feeling of Stress : Not at all  Social Connections: Moderately Isolated (01/25/2022)   Social Connection and Isolation Panel [NHANES]    Frequency of Communication with Friends and Family: Three times a  week    Frequency of Social Gatherings with Friends and Family: Once a week    Attends Religious Services: More than 4 times per year    Active Member of [employer] or Organizations: No    Attends Music therapist: Never    Marital Status: Never married     Family History:  The patient's family history includes Alcohol abuse in her father; Breast cancer (age of onset: 46) in her maternal grandmother; Coronary artery disease in her father and mother; Diabetes in her brother and mother; Heart attack in her father and mother; Heart disease in her father and mother; Hypertension in her brother and mother. There is no history of Colon cancer.  ROS:   ROS   EKGs/Labs/Other Studies Reviewed:    Studies reviewed were summarized above. The additional studies were reviewed today:  LHC 09/05/2019: Coronary dominance: Right/or codominant  Left mainstem:   Large vessel that bifurcates into the LAD and left circumflex, no significant disease noted  Left anterior descending (LAD):   Large vessel that extends to the apical region, diagonal branch 2 of moderate size, proximal LAD with 50% stenosis, unchanged from 2016  Left circumflex (LCx):  Large vessel with OM branch 2, patent stent in the mid to distal vessel 90% proximal left circumflex disease prior to takeoff of the OM1, progression from 60% stenosis in 2016 Distal left circumflex disease estimated 50-60%, beyond the mid to distal stent  Right coronary artery (RCA): Moderate sized right coronary artery, occluded in the mid vessel, collaterals from left to right Known to be occluded from prior catheterization 2016  Left ventriculography: Left ventricular systolic function is normal, LVEF is estimated at 55%, there is no significant mitral regurgitation , no significant aortic valve stenosis  Final Conclusions:   Occluded mid RCA, known from 2016 Severe stenosis of proximal left circumflex Moderate proximal LAD disease Moderate  distal left circumflex disease, distal to the stent  Recommendations:  Case discussed with interventional cardiology We will plan for PCI of proximal left circumflex, likely culprit vessel Medical management of proximal LAD  disease, distal left circumflex disease Strong recommendation for smoking cessation     PCI: Prox Cx lesion is 90% stenosed. Previously placed Mid Cx to Dist Cx stent (unknown type) is widely patent. A drug-eluting stent was successfully placed using a [place] 1.93X90. Post intervention, there is a 0% residual stenosis.   1.  Vessel CAD with 90% stenosis proximal left circumflex 2.  Successful PCI with DES proximal left circumflex __________   2D echo 09/05/2019: 1. Left ventricular ejection fraction, by visual estimation, is 60 to  65%. The left ventricle has normal function. Normal left ventricular size.  There is no left ventricular hypertrophy.   2. Left ventricular diastolic Doppler parameters are consistent with  impaired relaxation pattern of LV diastolic filling.   3. Global right ventricle has normal systolic function.The right  ventricular size is normal. No increase in right ventricular wall  thickness.   4. Left atrial size was mildly dilated.   5. Moderately elevated pulmonary artery systolic pressure. 45 mm Hg   6. Definity contrast agent was given IV to delineate the left ventricular  endocardial borders. ___________   2D echo 12/01/2016: - Left ventricle: The cavity size was normal. Wall thickness was    increased in a pattern of mild LVH. Systolic function was normal.    The estimated ejection fraction was in the range of 60% to 65%.    Wall motion was normal; there were no regional wall motion    abnormalities. Doppler parameters are consistent with abnormal    left ventricular relaxation (grade 1 diastolic dysfunction).  - Mitral valve: Calcified annulus.  - Left atrium: The atrium was mildly dilated.  - Pericardium,  extracardiac: A trivial pericardial effusion was    identified. __________   Carlton Adam MPI 11/17/2016: Pharmacological myocardial perfusion imaging study with small mild region of ischemia noted in the mid to apical lateral wall Unable to definatively exclude attenuation artifact given high uptake in the inferoseptal wall.  Normal wall motion, EF estimated at 83% No EKG changes concerning for ischemia at peak stress or in recovery. Low risk scan given very small mild perfusion defect. __________   LHC 04/30/2015: Mid RCA lesion, 100% stenosed. Prox Cx lesion, 50% stenosed. Prox LAD lesion, 60% stenosed. Ost 1st Diag to 1st Diag lesion, 60% stenosed. Mid Cx to Dist Cx lesion, 90% stenosed. There is a 0% residual stenosis post intervention. A drug-eluting stent was placed.   1. Severe one-vessel coronary artery disease involving the mid left circumflex. Moderate proximal LAD stenosis not significant by FFR (0.83). Occluded small codominant mid RCA with left-to-right collaterals. 2. Normal LV systolic function by noninvasive testing. Mildly elevated left ventricular end-diastolic pressure. 3. Successful angioplasty and drug-eluting stent placement to the mid left circumflex.   Recommendations: Dual antiplatelets therapy for at least one year. Aggressive treatment of risk factors. __________   Jodie Echevaria MPI 04/22/2015: This is an intermediate risk study. Defect 1: There is a medium defect of moderate severity present in the basal inferolateral, mid inferolateral, apical inferior and apical lateral location. Findings consistent with ischemia. There was no ST segment deviation noted during stress. T wave inversion of 3 mm was noted during stress in the lateral leads (V5, V6 and V2), beginning at 0 minutes of stress, ending at 1 minutes of stress, and returning to baseline after 1-5 mins of recovery. asymmetric The left ventricular ejection fraction is normal (55-65%).   If Clinical Conditions  warrant, would recommend Cardiac Catheterization. __________   2D  echo 04/23/2013: - Left ventricle: The cavity size was normal. Wall thickness    was increased in a pattern of mild LVH. The estimated    ejection fraction was 60%. Wall motion was normal; there    were no regional wall motion abnormalities. Findings    consistent with left ventricular diastolic dysfunction.  - Aortic valve: Sclerosis without stenosis. Mild    regurgitation.  - Mitral valve: Mildly to moderately calcified annulus.  - Tricuspid valve: Mild regurgitation.  - Pulmonary arteries: PA peak pressure: 72m Hg (S).   EKG:  EKG is ordered today.  The EKG ordered today demonstrates ***  Recent Labs: 02/28/2022: BUN 15; Creatinine, Ser 0.81; Hemoglobin 11.7; Platelets 217.0; Potassium 4.8; Sodium 135 06/03/2022: ALT 41  Recent Lipid Panel    Component Value Date/Time   CHOL 141 02/28/2022 1318   CHOL 206 (H) 11/16/2016 1456   TRIG 125.0 02/28/2022 1318   HDL 44.20 02/28/2022 1318   HDL 54 11/16/2016 1456   CHOLHDL 3 02/28/2022 1318   VLDL 25.0 02/28/2022 1318   LDLCALC 72 02/28/2022 1318   LDLCALC 118 (H) 11/16/2016 1456    PHYSICAL EXAM:    VS:  There were no vitals taken for this visit.  BMI: There is no height or weight on file to calculate BMI.  Physical Exam  Wt Readings from Last 3 Encounters:  06/03/22 244 lb (110.7 kg)  02/28/22 239 lb (108.4 kg)  01/25/22 217 lb (98.4 kg)     ASSESSMENT & PLAN:   CAD involving the native coronary arteries without***angina:  HTN: Blood pressure  HLD: LDL 72 in 02/2022 with normal ALT and mildly elevated AST in 05/2022.  Aortic valve sclerosis: No evidence of stenosis from echo in 08/2019.  Normal LFTs:   {Are you ordering a CV Procedure (e.g. stress test, cath, DCCV, TEE, etc)?   Press F2        :[id number]}    Disposition: F/u with Dr. AFletcher Anonor an APP in ***.   Medication Adjustments/Labs and Tests Ordered: Current medicines are reviewed at  length with the patient today.  Concerns regarding medicines are outlined above. Medication changes, Labs and Tests ordered today are summarized above and listed in the Patient Instructions accessible in Encounters.   Signed, RChristell Faith PA-C 09/30/2022 11:15 AM     [id number] [place] [phone]

## 2022-10-04 ENCOUNTER — Ambulatory Visit: Payer: Medicare HMO | Admitting: Physician Assistant

## 2022-10-22 ENCOUNTER — Other Ambulatory Visit: Payer: Self-pay | Admitting: Family Medicine

## 2022-10-25 NOTE — Progress Notes (Unsigned)
Cardiology Office Note    Date:  10/25/2022   ID:  GREGORY DOWE, DOB February 14, 1956, MRN [id number]  PCP:  Tonia Ghent, MD  Cardiologist:  Kathlyn Sacramento, MD  Electrophysiologist:  None   Chief Complaint: Follow-up  History of Present Illness:   Andrea Webster is a 66 y.o. female with history of CAD with MI at age 65 status post PTCA to unknown vessel status post DES to the LCx in 2016 and 2020, HTN, HLD intolerant to high intensity statins as well as abnormal LFTs noted on atorvastatin, strong family history of CAD, iron deficiency anemia, prior tobacco use until 01/2015, obesity, and GERD who presents for follow-up of CAD.   LHC in 04/2015 showed CTO of the RCA, severe mid LCx disease with 90% stenosis, and moderate proximal LAD disease.  FFR within the LAD was nonsignificant at 0.83.  The LCx was felt to be the culprit vessel and was treated successfully with PCI/DES.  Lexiscan MPI in 10/2016 showed a small mild region of ischemia noted in the mid to apical lateral wall, unable to exclude attenuation artifact, and was overall low risk given very small mild perfusion defect.  Echo in 11/2016 demonstrated an EF of 60 to 65%, no regional wall motion abnormalities, grade 1 diastolic dysfunction, calcified mitral annulus, mildly dilated left atrium, and trivial pericardial effusion.  She was admitted to the hospital in 08/2019 with unstable angina.  Echo showed an EF of 60 to 65% with grade 1 diastolic dysfunction, mild aortic valve sclerosis without evidence of stenosis, and moderate pulmonary hypertension with a PASP of 45 mmHg.  LHC showed known CTO of the RCA, moderate proximal LAD disease, patent LCx stent with severe proximal stenosis and moderate distal disease.  She underwent successful PCI/DES to the proximal LCx.     Right upper quadrant ultrasound in 03/2022, performed for elevated LFTs, demonstrated increased echogenicity of the liver parenchyma possibly representing hepatic steatosis  versus other hepatocellular disease.  She was last seen in the office in 05/2022 and was without symptoms of angina or decompensation.  ***   Labs independently reviewed: 05/2022 - albumin 4.1, AST 56, ALT normal 02/2022 - TC 141, TG 125, HDL 44, LDL 72, potassium 4.8, BUN 15, SCr 0.81, HGB 11.7, PLT 217 05/2019 - A1c 5.9 03/2015 - TSH normal   Past Medical History:  Diagnosis Date   Anxiety    Arthritis    Baker's cyst    x 2   CAD (coronary artery disease)    a. s/p MI & PTCA - age 23;  b. 04/2015 PCI: LM nl, LAD 60p (FFR 0.83), D1 60ost, LCX 50p, 51m(2.75x15 Xience [place]pine DES), OM1/2/3 min irregs, RCA 10110mTO; c. 08/2019 PCI: LM nl, LAD mod prox dzs, LCX patent stent w/ severe prox dzs (DES), RCA CTO mid.   Chronic sciatica    Complication of anesthesia    Constipation    Depression    intolerant of paxil, wellbutrin, zoloft   Diastolic dysfunction    a. 08/2019 Echo: EF 60-65%, gr1 DD, PASP 4534m.   GERD (gastroesophageal reflux disease)    Hayfever    headaches    Heart disease    Heart murmur    Heart valve disease    High cholesterol    Hypertension    Incisional hernia    Insomnia    [place] [place]   stented. first MI at age [age]Plantar fasciitis  Primary insomnia    Rectal polyp 03/2020   RLS (restless legs syndrome)    Sciatica    Sleep apnea    snores very loudly   Smoker    a. quit 01/2014.    Past Surgical History:  Procedure Laterality Date   [place]   open   [place]   CARDIAC CATHETERIZATION N/A 04/30/2015   Procedure: Left Heart Cath;  Surgeon: Wellington Hampshire, MD;  Location: [facility];  Service: Cardiovascular;  Laterality: N/A;   CARPAL TUNNEL RELEASE     [place]   CHOLECYSTECTOMY N/A 03/30/2016   Procedure: LAPAROSCOPIC CHOLECYSTECTOMY WITH INTRAOPERATIVE CHOLANGIOGRAM;  Surgeon: Jules Husbands, MD;  Location: [facility];  Service: General;  Laterality: N/A;   COLONOSCOPY WITH PROPOFOL N/A 06/26/2020   Procedure: COLONOSCOPY WITH PROPOFOL;  Surgeon: Robert Bellow, MD;  Location: [facility];  Service: Endoscopy;  Laterality: N/A;   [place]    CORONARY [facility]   CORONARY STENT INTERVENTION N/A 09/05/2019   Procedure: CORONARY STENT INTERVENTION;  Surgeon: Isaias Cowman, MD;  Location: [facility] [facility];  Service: Cardiovascular;  Laterality: N/A;   ESOPHAGOGASTRODUODENOSCOPY (EGD) WITH PROPOFOL N/A 06/26/2020   Procedure: ESOPHAGOGASTRODUODENOSCOPY (EGD) WITH PROPOFOL;  Surgeon: Robert Bellow, MD;  Location: [facility];  Service: Endoscopy;  Laterality: N/A;   EXCISION OF MESH N/A 04/29/2020   Procedure: EXCISION OF MESH;  Surgeon: Robert Bellow, MD;  Location: [facility];  Service: General;  Laterality: N/A;   HERNIA REPAIR  11/2016   [facility]; Dr. Gaynelle Arabian   LEFT HEART CATH AND CORONARY ANGIOGRAPHY N/A 09/05/2019   Procedure: LEFT HEART CATH AND CORONARY ANGIOGRAPHY;  Surgeon: Minna Merritts, MD;  Location: [place] [facility];  Service: Cardiovascular;  Laterality: N/A;   VAGINAL HYSTERECTOMY     VENTRAL HERNIA REPAIR N/A 04/29/2020   Procedure: HERNIA REPAIR VENTRAL ADULT;  Surgeon: Robert Bellow, MD;  Location: [facility];  Service: General;  Laterality: N/A;  Ventral hernia repair with component separation- General anesthesia and exparel block; Arvilla Meres, RNFA to assist    Current Medications: No outpatient medications have been marked as taking for the 11/01/22 encounter (Appointment) with Rise Mu, PA-C.    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    Comment: disabled  Tobacco Use   Smoking status: Former    Packs/day: 0.33    Years: 20.00    Total pack years: 6.60    Types: Cigarettes    Quit date: 02/13/2015    Years since quitting: 7.7   Smokeless tobacco: Never  Vaping Use   Vaping Use: Never used  Substance and Sexual Activity   Alcohol use: Not Currently    Alcohol/week: 0.0 standard drinks of alcohol   Drug use: No   Sexual activity: Not  on file  Other Topics Concern   Not on file  Social History Narrative   From [place], to [place] 2011   Widow as of 2001   1 son prev incarcerated, out as of 2019, in contact as of 2023   1 daughter   Lives with significant other, Tim   Social Determinants of Health   Financial Resource Strain: Low Risk  (01/25/2022)   Overall Financial Resource Strain (CARDIA)    Difficulty of Paying Living Expenses: Not hard at all  Food Insecurity: No Food Insecurity (01/25/2022)   Hunger Vital Sign    Worried About Running Out of Food in the Last Year: Never true    [place] in the Last Year: Never true  Transportation Needs: No Transportation Needs (01/25/2022)   PRAPARE - Hydrologist (Medical): No    Lack of Transportation (Non-Medical): No  Physical Activity: Sufficiently Active (01/25/2022)   Exercise Vital Sign    Days of Exercise per Week: 7 days    Minutes of Exercise per Session: 30 min  Stress: No Stress Concern Present (01/25/2022)   [place]    Feeling of Stress : Not at all  Social Connections: Moderately Isolated (01/25/2022)   Social Connection and Isolation Panel [NHANES]    Frequency of Communication with Friends and Family: Three  times a week    Frequency of Social Gatherings with Friends and Family: Once a week    Attends Religious Services: More than 4 times per year    Active Member of [employer] or Organizations: No    Attends Music therapist: Never    Marital Status: Never married     Family History:  The patient's family history includes Alcohol abuse in her father; Breast cancer (age of onset: 40) in her maternal grandmother; Coronary artery disease in her father and mother; Diabetes in her brother and mother; Heart attack in her father and mother; Heart disease in her father and mother; Hypertension in her brother and mother. There is no history of Colon cancer.  ROS:   12-point review of systems is negative unless otherwise noted in the HPI.   EKGs/Labs/Other Studies Reviewed:    Studies reviewed were summarized above. The additional studies were reviewed today:  LHC 09/05/2019: Coronary dominance: Right/or codominant  Left mainstem:   Large vessel that bifurcates into the LAD and left circumflex, no significant disease noted  Left anterior descending (LAD):   Large vessel that extends to the apical region, diagonal branch 2 of moderate size, proximal LAD with 50% stenosis, unchanged from 2016  Left circumflex (LCx):  Large vessel with OM branch 2, patent stent in the mid to distal vessel 90% proximal left circumflex disease prior to takeoff of the OM1, progression from 60% stenosis in 2016 Distal left circumflex disease estimated 50-60%, beyond the mid to distal stent  Right coronary artery (RCA): Moderate sized right coronary artery, occluded in the mid vessel, collaterals from left to right Known to be occluded from prior catheterization 2016  Left ventriculography: Left ventricular systolic function is normal, LVEF is estimated at 55%, there is no significant mitral regurgitation , no significant aortic valve stenosis  Final Conclusions:   Occluded mid RCA, known from 2016 Severe  stenosis of proximal left circumflex Moderate proximal LAD disease Moderate distal left circumflex disease, distal to the stent  Recommendations:  Case discussed with interventional cardiology We will plan for PCI  of proximal left circumflex, likely culprit vessel Medical management of proximal LAD disease, distal left circumflex disease Strong recommendation for smoking cessation     PCI: Prox Cx lesion is 90% stenosed. Previously placed Mid Cx to Dist Cx stent (unknown type) is widely patent. A drug-eluting stent was successfully placed using a [place] 1.44R15. Post intervention, there is a 0% residual stenosis.   1.  Vessel CAD with 90% stenosis proximal left circumflex 2.  Successful PCI with DES proximal left circumflex __________   2D echo 09/05/2019: 1. Left ventricular ejection fraction, by visual estimation, is 60 to  65%. The left ventricle has normal function. Normal left ventricular size.  There is no left ventricular hypertrophy.   2. Left ventricular diastolic Doppler parameters are consistent with  impaired relaxation pattern of LV diastolic filling.   3. Global right ventricle has normal systolic function.The right  ventricular size is normal. No increase in right ventricular wall  thickness.   4. Left atrial size was mildly dilated.   5. Moderately elevated pulmonary artery systolic pressure. 45 mm Hg   6. Definity contrast agent was given IV to delineate the left ventricular  endocardial borders. ___________   2D echo 12/01/2016: - Left ventricle: The cavity size was normal. Wall thickness was    increased in a pattern of mild LVH. Systolic function was normal.    The estimated ejection fraction was in the range of 60% to 65%.    Wall motion was normal; there were no regional wall motion    abnormalities. Doppler parameters are consistent with abnormal    left ventricular relaxation (grade 1 diastolic dysfunction).  - Mitral valve: Calcified  annulus.  - Left atrium: The atrium was mildly dilated.  - Pericardium, extracardiac: A trivial pericardial effusion was    identified. __________   Carlton Adam MPI 11/17/2016: Pharmacological myocardial perfusion imaging study with small mild region of ischemia noted in the mid to apical lateral wall Unable to definatively exclude attenuation artifact given high uptake in the inferoseptal wall.  Normal wall motion, EF estimated at 83% No EKG changes concerning for ischemia at peak stress or in recovery. Low risk scan given very small mild perfusion defect. __________   LHC 04/30/2015: Mid RCA lesion, 100% stenosed. Prox Cx lesion, 50% stenosed. Prox LAD lesion, 60% stenosed. Ost 1st Diag to 1st Diag lesion, 60% stenosed. Mid Cx to Dist Cx lesion, 90% stenosed. There is a 0% residual stenosis post intervention. A drug-eluting stent was placed.   1. Severe one-vessel coronary artery disease involving the mid left circumflex. Moderate proximal LAD stenosis not significant by FFR (0.83). Occluded small codominant mid RCA with left-to-right collaterals. 2. Normal LV systolic function by noninvasive testing. Mildly elevated left ventricular end-diastolic pressure. 3. Successful angioplasty and drug-eluting stent placement to the mid left circumflex.   Recommendations: Dual antiplatelets therapy for at least one year. Aggressive treatment of risk factors. __________   Jodie Echevaria MPI 04/22/2015: This is an intermediate risk study. Defect 1: There is a medium defect of moderate severity present in the basal inferolateral, mid inferolateral, apical inferior and apical lateral location. Findings consistent with ischemia. There was no ST segment deviation noted during stress. T wave inversion of 3 mm was noted during stress in the lateral leads (V5, V6 and V2), beginning at 0 minutes of stress, ending at 1 minutes of stress, and returning to baseline after 1-5 mins of recovery. asymmetric The left  ventricular ejection fraction is normal (55-65%).  If Clinical Conditions warrant, would recommend Cardiac Catheterization. __________   2D echo 04/23/2013: - Left ventricle: The cavity size was normal. Wall thickness    was increased in a pattern of mild LVH. The estimated    ejection fraction was 60%. Wall motion was normal; there    were no regional wall motion abnormalities. Findings    consistent with left ventricular diastolic dysfunction.  - Aortic valve: Sclerosis without stenosis. Mild    regurgitation.  - Mitral valve: Mildly to moderately calcified annulus.  - Tricuspid valve: Mild regurgitation.  - Pulmonary arteries: PA peak pressure: 60m Hg (S).   EKG:  EKG is ordered today.  The EKG ordered today demonstrates ***  Recent Labs: 02/28/2022: BUN 15; Creatinine, Ser 0.81; Hemoglobin 11.7; Platelets 217.0; Potassium 4.8; Sodium 135 06/03/2022: ALT 41  Recent Lipid Panel    Component Value Date/Time   CHOL 141 02/28/2022 1318   CHOL 206 (H) 11/16/2016 1456   TRIG 125.0 02/28/2022 1318   HDL 44.20 02/28/2022 1318   HDL 54 11/16/2016 1456   CHOLHDL 3 02/28/2022 1318   VLDL 25.0 02/28/2022 1318   LDLCALC 72 02/28/2022 1318   LDLCALC 118 (H) 11/16/2016 1456    PHYSICAL EXAM:    VS:  There were no vitals taken for this visit.  BMI: There is no height or weight on file to calculate BMI.  Physical Exam  Wt Readings from Last 3 Encounters:  06/03/22 244 lb (110.7 kg)  02/28/22 239 lb (108.4 kg)  01/25/22 217 lb (98.4 kg)     ASSESSMENT & PLAN:   CAD involving the native coronary arteries without***angina:  HTN: Blood pressure  HLD: LDL 72 in 02/2022 with normal ALT and mildly elevated AST in 05/2022.  Aortic valve sclerosis: No evidence of stenosis from echo in 08/2019.  Abnormal LFTs:   {Are you ordering a CV Procedure (e.g. stress test, cath, DCCV, TEE, etc)?   Press F2        :[id number]}    Disposition: F/u with Dr. AFletcher Anonor an APP in  ***.   Medication Adjustments/Labs and Tests Ordered: Current medicines are reviewed at length with the patient today.  Concerns regarding medicines are outlined above. Medication changes, Labs and Tests ordered today are summarized above and listed in the Patient Instructions accessible in Encounters.   Signed, RChristell Faith PA-C 10/25/2022 9:49 AM     [facility]RRafael GonzalezSuite [address]M[place]ellington [place] [phone]

## 2022-10-26 ENCOUNTER — Telehealth: Payer: Self-pay | Admitting: Family Medicine

## 2022-10-26 NOTE — Telephone Encounter (Signed)
She already has a lab order from cardiology in the EMR.  See if the lab can use that order.  Thanks.

## 2022-10-26 NOTE — Telephone Encounter (Signed)
Pt called stating she has a ov scheduled with Dr. Christell Faith & she is suppose to get labs done before her ov with Dr. Idolina Primer. Pt is asking can she get labs done tomorrow, 10/27/22? I told pt orders would have to be submitted by Damita Dunnings before she comes for labs. Pt is asking can Damita Dunnings submit orders? Call back # [phone]

## 2022-10-27 ENCOUNTER — Other Ambulatory Visit (INDEPENDENT_AMBULATORY_CARE_PROVIDER_SITE_OTHER): Payer: Medicare HMO

## 2022-10-27 DIAGNOSIS — R7989 Other specified abnormal findings of blood chemistry: Secondary | ICD-10-CM | POA: Diagnosis not present

## 2022-10-27 NOTE — Telephone Encounter (Signed)
Spoke with Joellen and she stated this time we can use another doctors lab order but usually needs to be ordered under [place]. Called patient and scheduled her to come in this afternoon to get lab done.

## 2022-10-28 LAB — HEPATIC FUNCTION PANEL
ALT: 50 U/L — ABNORMAL HIGH (ref 0–35)
AST: 53 U/L — ABNORMAL HIGH (ref 0–37)
Albumin: 4.2 g/dL (ref 3.5–5.2)
Alkaline Phosphatase: 99 U/L (ref 39–117)
Bilirubin, Direct: 0.2 mg/dL (ref 0.0–0.3)
Total Bilirubin: 0.8 mg/dL (ref 0.2–1.2)
Total Protein: 7.2 g/dL (ref 6.0–8.3)

## 2022-11-01 ENCOUNTER — Encounter: Payer: Self-pay | Admitting: Physician Assistant

## 2022-11-01 ENCOUNTER — Ambulatory Visit: Payer: Medicare HMO | Attending: Physician Assistant | Admitting: Physician Assistant

## 2022-11-01 VITALS — BP 144/72 | HR 91 | Ht 60.0 in | Wt 240.0 lb

## 2022-11-01 DIAGNOSIS — I2510 Atherosclerotic heart disease of native coronary artery without angina pectoris: Secondary | ICD-10-CM

## 2022-11-01 DIAGNOSIS — I358 Other nonrheumatic aortic valve disorders: Secondary | ICD-10-CM

## 2022-11-01 DIAGNOSIS — E785 Hyperlipidemia, unspecified: Secondary | ICD-10-CM

## 2022-11-01 DIAGNOSIS — R7989 Other specified abnormal findings of blood chemistry: Secondary | ICD-10-CM | POA: Diagnosis not present

## 2022-11-01 DIAGNOSIS — I10 Essential (primary) hypertension: Secondary | ICD-10-CM

## 2022-11-01 NOTE — Patient Instructions (Signed)
Medication Instructions:  Your physician has recommended you make the following change in your medication:   STOP Ezetimibe (Zetia)   *If you need a refill on your cardiac medications before your next appointment, please call your pharmacy*   Lab Work: LFT in one month. No appointment is needed. Just go to the following location:  Medical Mall Entrance at [facility] 1st desk on the right to check in (REGISTRATION)  Lab hours: Monday- Friday (7:30 am- 5:30 pm)  If you have labs (blood work) drawn today and your tests are completely normal, you will receive your results only by: MyChart Message (if you have MyChart) OR A paper copy in the mail If you have any lab test that is abnormal or we need to change your treatment, we will call you to review the results.   Testing/Procedures: None   Follow-Up: At [facility], you and your health needs are our priority.  As part of our continuing mission to provide you with exceptional heart care, we have created designated Provider Care Teams.  These Care Teams include your primary Cardiologist (physician) and Advanced Practice Providers (APPs -  Physician Assistants and Nurse Practitioners) who all work together to provide you with the care you need, when you need it.  Your next appointment:   2 month(s)  The format for your next appointment:   In Person  Provider:   Kathlyn Sacramento, MD or Christell Faith, PA-C        Important Information About Sugar

## 2022-11-02 ENCOUNTER — Telehealth: Payer: Self-pay | Admitting: Family Medicine

## 2022-11-02 DIAGNOSIS — R7989 Other specified abnormal findings of blood chemistry: Secondary | ICD-10-CM

## 2022-11-02 NOTE — Telephone Encounter (Signed)
Spoke with patient and stated that Cardio took her off zetia, will do repeat labs in Jan and will have OV in feb. Patient states she will schedule ov with GI when they call as it takes a while sometimes to get in with them.

## 2022-11-02 NOTE — Telephone Encounter (Signed)
Please call pt.  Given her recent LFT elevation, cardiology rec'd GI eval.  I put in the referral.  Thanks.

## 2022-11-02 NOTE — Addendum Note (Signed)
Addended by: Sherrilee Gilles B on: 11/02/2022 12:12 PM   Modules accepted: Orders

## 2022-11-10 ENCOUNTER — Ambulatory Visit (INDEPENDENT_AMBULATORY_CARE_PROVIDER_SITE_OTHER)
Admission: RE | Admit: 2022-11-10 | Discharge: 2022-11-10 | Disposition: A | Discharge status: Home / Self Care | Payer: Medicare HMO | Source: Ambulatory Visit | Attending: Family Medicine | Admitting: Family Medicine

## 2022-11-10 ENCOUNTER — Ambulatory Visit (INDEPENDENT_AMBULATORY_CARE_PROVIDER_SITE_OTHER): Payer: Medicare HMO | Admitting: Family Medicine

## 2022-11-10 ENCOUNTER — Encounter: Payer: Self-pay | Admitting: Family Medicine

## 2022-11-10 VITALS — BP 142/74 | HR 83 | Temp 97.2°F | Ht 60.0 in | Wt 239.0 lb

## 2022-11-10 DIAGNOSIS — R300 Dysuria: Secondary | ICD-10-CM

## 2022-11-10 DIAGNOSIS — M4316 Spondylolisthesis, lumbar region: Secondary | ICD-10-CM | POA: Diagnosis not present

## 2022-11-10 DIAGNOSIS — M545 Low back pain, unspecified: Secondary | ICD-10-CM | POA: Diagnosis not present

## 2022-11-10 DIAGNOSIS — M549 Dorsalgia, unspecified: Secondary | ICD-10-CM | POA: Diagnosis not present

## 2022-11-10 MED ORDER — SULFAMETHOXAZOLE-TRIMETHOPRIM 800-160 MG PO TABS: 1.0000 | ORAL_TABLET | Freq: Two times a day (BID) | ORAL | 0 refills | Status: DC

## 2022-11-10 MED ORDER — GABAPENTIN 100 MG PO CAPS: 100.0000 mg | ORAL_CAPSULE | Freq: Three times a day (TID) | ORAL | 1 refills | Status: DC | PRN

## 2022-11-10 NOTE — Progress Notes (Unsigned)
dysuria: Patient is having urinary freq, urgency, burning when urinates  duration of symptoms: 2 days abdominal pain: no fevers: no back pain: see below.  vomiting: no  Back pain that radiates from the middle of back down to her left vs right leg, down to the feet. Longstanding, episodic.  Prev went to chiropractor. This episode started this summer.  Pain worse standing, walking.  Sitting doesn't always help.  Normal sensation in the feet.   Meds, vitals, and allergies reviewed.   Per HPI unless specifically indicated in ROS section   GEN: nad, alert and oriented HEENT: ncat NECK: supple CV: rrr.  PULM: ctab, no inc wob ABD: soft, +bs, suprapubic area not tender EXT: no edema SKIN: no acute rash BACK: no CVA pain L buttock ttp.  L hip normal int/ext rotation and SLR neg.

## 2022-11-10 NOTE — Patient Instructions (Signed)
Use gabapentin for pain, sedation caution.  Start with 1 tab a day.  Drink plenty of water and start the antibiotics today.  We'll contact you with your lab report.  Take care.

## 2022-11-12 LAB — URINE CULTURE
MICRO NUMBER:: 14345796
SPECIMEN QUALITY:: ADEQUATE

## 2022-11-13 NOTE — Assessment & Plan Note (Signed)
Start gabapentin, routine cautions given to patient, gradually escalate dose if needed/tolerated.  See notes on imaging.

## 2022-11-13 NOTE — Assessment & Plan Note (Signed)
Start Septra.  See notes on urine culture.

## 2022-11-24 ENCOUNTER — Telehealth: Payer: Self-pay | Admitting: Family Medicine

## 2022-11-24 NOTE — Telephone Encounter (Signed)
Patient came by and dropped off paperwork that needs to be filled out for [employer] duty. Placed in [place] box.

## 2022-11-25 NOTE — Telephone Encounter (Signed)
PPW placed in Dr. Carole Civil box

## 2022-11-26 NOTE — Telephone Encounter (Signed)
I will work on the hardcopy.  Thanks. 

## 2022-11-27 NOTE — Telephone Encounter (Signed)
Please make sure the letter printed for the patient so I can sign it.  Thanks.

## 2022-11-28 NOTE — Telephone Encounter (Signed)
Letter printed and placed up front for pickup. Patient is aware.

## 2022-11-30 ENCOUNTER — Other Ambulatory Visit: Payer: Self-pay | Admitting: Family Medicine

## 2022-12-01 NOTE — Telephone Encounter (Signed)
Refill request for ALPRAZOLAM 1 MG Tablet   LOV - 11/10/22 Next OV - not scheduled Last refill - 08/05/22 #90/1

## 2022-12-28 ENCOUNTER — Other Ambulatory Visit: Payer: Self-pay | Admitting: Family Medicine

## 2022-12-30 NOTE — Telephone Encounter (Signed)
Refill request for ZOLPIDEM TARTRATE 10 MG Tablet   LOV - 11/10/22 Next OV - not scheduled Last refill - 10/07/22 #90/0

## 2022-12-31 NOTE — Telephone Encounter (Signed)
Rx sent, please verify effectiveness with patient. Thanks.

## 2023-01-02 ENCOUNTER — Ambulatory Visit: Payer: Medicare HMO | Admitting: Physician Assistant

## 2023-01-04 ENCOUNTER — Other Ambulatory Visit
Admission: RE | Admit: 2023-01-04 | Discharge: 2023-01-04 | Disposition: A | Discharge status: Home / Self Care | Payer: Medicare HMO | Attending: Physician Assistant | Admitting: Physician Assistant

## 2023-01-04 DIAGNOSIS — R7989 Other specified abnormal findings of blood chemistry: Secondary | ICD-10-CM

## 2023-01-04 DIAGNOSIS — I358 Other nonrheumatic aortic valve disorders: Secondary | ICD-10-CM

## 2023-01-04 DIAGNOSIS — E785 Hyperlipidemia, unspecified: Secondary | ICD-10-CM | POA: Diagnosis not present

## 2023-01-04 LAB — HEPATIC FUNCTION PANEL
ALT: 68 U/L — ABNORMAL HIGH (ref 0–44)
AST: 102 U/L — ABNORMAL HIGH (ref 15–41)
Albumin: 3.9 g/dL (ref 3.5–5.0)
Alkaline Phosphatase: 105 U/L (ref 38–126)
Bilirubin, Direct: 0.2 mg/dL (ref 0.0–0.2)
Indirect Bilirubin: 0.9 mg/dL (ref 0.3–0.9)
Total Bilirubin: 1.1 mg/dL (ref 0.3–1.2)
Total Protein: 7.8 g/dL (ref 6.5–8.1)

## 2023-01-06 NOTE — Progress Notes (Unsigned)
Cardiology Office Note    Date:  01/10/2023   ID:  [name], [name] October 05, 1956, MRN [id number]  PCP:  Tonia Ghent, MD  Cardiologist:  Kathlyn Sacramento, MD  Electrophysiologist:  None   Chief Complaint: Follow up  History of Present Illness:   Andrea Webster is a 67 y.o. female with history of CAD with MI at age 17 status post PTCA to unknown vessel status post DES to the LCx in 2016 and 2020, HTN, HLD intolerant to high intensity statins as well as abnormal LFTs noted on atorvastatin, strong family history of CAD, iron deficiency anemia, prior tobacco use until 01/2015, obesity, and GERD who presents for follow-up of CAD.   LHC in 04/2015 showed CTO of the RCA, severe mid LCx disease with 90% stenosis, and moderate proximal LAD disease.  FFR within the LAD was nonsignificant at 0.83.  The LCx was felt to be the culprit vessel and was treated successfully with PCI/DES.  Lexiscan MPI in 10/2016 showed a small mild region of ischemia noted in the mid to apical lateral wall, unable to exclude attenuation artifact, and was overall low risk given very small mild perfusion defect.  Echo in 11/2016 demonstrated an EF of 60 to 65%, no regional wall motion abnormalities, grade 1 diastolic dysfunction, calcified mitral annulus, mildly dilated left atrium, and trivial pericardial effusion.  She was admitted to the hospital in 08/2019 with unstable angina.  Echo showed an EF of 60 to 65% with grade 1 diastolic dysfunction, mild aortic valve sclerosis without evidence of stenosis, and moderate pulmonary hypertension with a PASP of 45 mmHg.  LHC showed known CTO of the RCA, moderate proximal LAD disease, patent LCx stent with severe proximal stenosis and moderate distal disease.  She underwent successful PCI/DES to the proximal LCx.     Right upper quadrant ultrasound in 03/2022, performed for elevated LFTs, demonstrated increased echogenicity of the liver parenchyma possibly representing hepatic steatosis  versus other hepatocellular disease.   She was last seen in the office in 10/2022 and was without symptoms of angina or cardiac decompensation.  Due to persistently elevated LFTs, ezetimibe was discontinued at that time.  Follow-up LFTs earlier this month showed continued elevations in AST and ALT, despite being off statin and ezetimibe, with recommendation for the patient to follow-up with her PCP.  She comes in doing very well from a cardiac perspective, and is without symptoms of angina or decompensation.  No dyspnea, palpitations, dizziness, presyncope, or syncope.  No progressive lower extremity swelling or orthopnea.  She reports her blood pressure is normal at home.  She remains very busy assisting with the care of her family.  She does not have any acute cardiac concerns at this time outside of further discussion regarding lipid management.   Labs independently reviewed: 12/2022 - AST 102, ALT 68, albumin 3.9 05/2022 - albumin 4.1, AST 56, ALT normal 02/2022 - TC 141, TG 125, HDL 44, LDL 72, potassium 4.8, BUN 15, SCr 0.81, HGB 11.7, PLT 217 05/2019 - A1c 5.9 03/2015 - TSH normal  Past Medical History:  Diagnosis Date   Anxiety    Arthritis    Baker's cyst    x 2   CAD (coronary artery disease)    a. s/p MI & PTCA - age 57;  b. 04/2015 PCI: LM nl, LAD 60p (FFR 0.83), D1 60ost, LCX 50p, 76m(2.75x15 Xience Alpine DES), OM1/2/3 min irregs, RCA 1020mTO; c. 08/2019 PCI: LM nl, LAD mod  prox dzs, LCX patent stent w/ severe prox dzs (DES), RCA CTO mid.   Chronic sciatica    Complication of anesthesia    Constipation    Depression    intolerant of paxil, wellbutrin, zoloft   Diastolic dysfunction    a. 08/2019 Echo: EF 60-65%, gr1 DD, PASP 53mHg.   GERD (gastroesophageal reflux disease)    Hayfever    headaches    Heart disease    Heart murmur    Heart valve disease    High cholesterol    Hypertension    Incisional hernia    Insomnia    Myocardial infarction ([place] 08/[place]    stented. first MI at age 67  Plantar fasciitis    Primary insomnia    Rectal polyp 03/2020   RLS (restless legs syndrome)    Sciatica    Sleep apnea    snores very loudly   Smoker    a. quit 01/2014.    Past Surgical History:  Procedure Laterality Date   AIron Belt  open   [place]   CARDIAC CATHETERIZATION N/A 04/30/2015   Procedure: Left Heart Cath;  Surgeon: MWellington Hampshire MD;  [facility];  Service: Cardiovascular;  Laterality: N/A;   CARPAL TUNNEL RELEASE     [place]  CHOLECYSTECTOMY N/A 03/30/2016   Procedure: LAPAROSCOPIC CHOLECYSTECTOMY WITH INTRAOPERATIVE CHOLANGIOGRAM;  Surgeon: DJules Husbands MD;  Location: [facility];  Service: General;  Laterality: N/A;   COLONOSCOPY WITH PROPOFOL N/A 06/26/2020   Procedure: COLONOSCOPY WITH PROPOFOL;  Surgeon: BRobert Bellow MD;  Location: [facility];  Service: Endoscopy;  Laterality: N/A;   [place]    CORONARY ANGIOPLASTY WITH STENT PLAC[facility]  [facility]  CORONARY STENT INTERVENTION N/A 09/05/2019   Procedure: CORONARY STENT INTERVENTION;  Surgeon: PIsaias Cowman MD;  [facility];  Service: Cardiovascular;  Laterality: N/A;   ESOPHAGOGASTRODUODENOSCOPY (EGD) WITH PROPOFOL N/A 06/26/2020   Procedure: ESOPHAGOGASTRODUODENOSCOPY (EGD) WITH PROPOFOL;  Surgeon: BRobert Bellow MD;  Location: [facility];  Service: Endoscopy;  Laterality: N/A;   EXCISION OF MESH N/A 04/29/2020   Procedure: EXCISION OF MESH;  Surgeon: BRobert Bellow MD;  Location: [facility];  Service: General;  Laterality: N/A;   [facility]  [facility]  LEFT HEART CATH AND CORONARY ANGIOGRAPHY N/A 09/05/2019   Procedure: LEFT HEART CATH AND CORONARY ANGIOGRAPHY;  Surgeon: GMinna Merritts MD;   [facility];  Service: Cardiovascular;  Laterality: N/A;   VAGINAL HYSTERECTOMY     VENTRAL HERNIA REPAIR N/A 04/29/2020   Procedure: HERNIA REPAIR VENTRAL ADULT;  Surgeon: BRobert Bellow MD;  Location: [facility];  Service: General;  Laterality: N/A;  Ventral hernia repair with component separation- General anesthesia and exparel block; JArvilla Meres RNFA to assist    Current Medications: Current Meds  Medication Sig   albuterol (VENTOLIN HFA) 108 (90 Base) MCG/ACT inhaler INHALE 2 PUFFS EVERY 6 HOURS AS NEEDED FOR WHEEZING OR SHORTNESS OF BREATH   ALPRAZolam (XANAX) 1 MG tablet TAKE 1 TABLET THREE TIMES DAILY AS NEEDED FOR ANXIETY   aspirin 81 MG EC tablet Take 81 mg by mouth daily.    cetirizine (ZYRTEC) 10 MG  tablet Take 10 mg by mouth daily.   Cholecalciferol (VITAMIN D) 50 MCG (2000 UT) CAPS Take 2,000 Units by mouth daily.    cyclobenzaprine (FLEXERIL) 10 MG tablet TAKE 1/2 TO 1 TABLET AT BEDTIME   Evolocumab (REPATHA) 140 MG/ML SOSY Inject 140 mg into the skin every 14 (fourteen) days.   fluticasone (FLONASE) 50 MCG/ACT nasal spray USE 2 SPRAYS IN EACH NOSTRIL ONE TIME DAILY   isosorbide mononitrate (IMDUR) 60 MG 24 hr tablet TAKE 1 TABLET EVERY DAY   ketotifen (ZADITOR) 0.025 % ophthalmic solution Place 1 drop into both eyes 2 (two) times daily.   metoprolol tartrate (LOPRESSOR) 25 MG tablet TAKE 1 TABLET TWICE DAILY   Multiple Vitamins-Minerals (MULTIVITAMIN WITH MINERALS) tablet Take 1 tablet by mouth daily.   nitroGLYCERIN (NITROSTAT) 0.4 MG SL tablet DISSOLVE 1 TABLET UNDER THE TONGUE EVERY 5 MINUTES AS NEEDED FOR CHEST PAIN AS DIRECTED   pantoprazole (PROTONIX) 40 MG tablet TAKE 1 TABLET EVERY DAY   senna (SENOKOT) 8.6 MG tablet Take 1 tablet by mouth at bedtime. Takes at night   zolpidem (AMBIEN) 10 MG tablet TAKE 1 TABLET BY MOUTH AT BEDTIME AS NEEDED FOR SLEEP   [DISCONTINUED] gabapentin (NEURONTIN) 100 MG capsule Take 1-2 capsules (100-200 mg total) by  mouth 3 (three) times daily as needed (for pain).    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    Comment: disabled  Tobacco Use   Smoking status: Former    Packs/day: 0.33    Years: 20.00    Total pack years: 6.60    Types: Cigarettes    Quit date: 02/13/2015    Years since quitting: 7.9   Smokeless tobacco: Never  Vaping Use   Vaping Use: Never used  Substance and Sexual Activity   Alcohol use: Not Currently    Alcohol/week: 0.0 standard drinks of alcohol   Drug use: No   Sexual activity: Not on file  Other Topics Concern   Not on file  Social History Narrative   From [place], to [place] 2011   Widow as of 2001   1 son prev incarcerated, out as of 2019, in contact as of 2023   1 daughter   Lives with significant other, Tim   Social Determinants of Health   Financial Resource Strain: [place]  (01/25/2022)   Overall Financial Resource Strain (CARDIA)    Difficulty of Paying Living Expenses: Not hard at all  Food Insecurity: No Food Insecurity (01/25/2022)   Hunger Vital Sign    Worried About Running Out of Food in the Last Year: Never true    [place] in the Last Year: Never true  Transportation Needs: No Transportation Needs (01/25/2022)   PRAPARE - Hydrologist (Medical): No    Lack of Transportation (Non-Medical): No  Physical Activity: Sufficiently Active (01/25/2022)   Exercise Vital Sign    Days of Exercise per Week: 7 days    Minutes of Exercise per Session: 30 min  Stress: No Stress Concern Present (01/25/2022)    [employer]  of Occupational Health - Occupational Stress Questionnaire    Feeling of Stress : Not at all  Social Connections: Moderately Isolated (01/25/2022)   Social Connection and Isolation Panel [NHANES]    Frequency of Communication with Friends and Family: Three times a week    Frequency of Social Gatherings with Friends and Family: Once a week    Attends Religious Services: More than 4 times per year    Active Member of [employer] or Organizations: No    Attends Music therapist: Never    Marital Status: Never married     Family History:  The patient's family history includes Alcohol abuse in her father; Breast cancer (age of onset: 18) in her maternal grandmother; Coronary artery disease in her father and mother; Diabetes in her brother and mother; Heart attack in her father and mother; Heart disease in her father and mother; Hypertension in her brother and mother. There is no history of Colon cancer.  ROS:   12-point review of systems is negative unless otherwise noted in HPI.   EKGs/Labs/Other Studies Reviewed:    Studies reviewed were summarized above. The additional studies were reviewed today:  LHC 09/05/2019: Coronary dominance: Right/or codominant  Left mainstem:   Large vessel that bifurcates into the LAD and left circumflex, no significant disease noted  Left anterior descending (LAD):   Large vessel that extends to the apical region, diagonal branch 2 of moderate size, proximal LAD with 50% stenosis, unchanged from 2016  Left circumflex (LCx):  Large vessel with OM branch 2, patent stent in the mid to distal vessel 90% proximal left circumflex disease prior to takeoff of the OM1, progression from 60% stenosis in 2016 Distal left circumflex disease estimated 50-60%, beyond the mid to distal stent  Right coronary artery (RCA): Moderate sized right coronary artery, occluded in the mid vessel, collaterals from left to right Known to be occluded from  prior catheterization 2016  Left ventriculography: Left ventricular systolic function is normal, LVEF is estimated at 55%, there is no significant mitral regurgitation , no significant aortic valve stenosis  Final Conclusions:   Occluded mid RCA, known from 2016 Severe stenosis of proximal left circumflex Moderate proximal LAD disease Moderate distal left circumflex disease, distal to the stent  Recommendations:  Case discussed with interventional cardiology We will plan for PCI of proximal left circumflex, likely culprit vessel Medical management of proximal LAD disease, distal left circumflex disease Strong recommendation for smoking cessation     PCI: Prox Cx lesion is 90% stenosed. Previously placed Mid Cx to Dist Cx stent (unknown type) is widely patent. A drug-eluting stent was successfully placed using a [place] [id number]. Post intervention, there is a 0% residual stenosis.   1.  Vessel CAD with 90% stenosis proximal left circumflex 2.  Successful PCI with DES proximal left circumflex __________   2D echo 09/05/2019: 1. Left ventricular ejection fraction, by visual estimation, is 60 to  65%. The left ventricle has normal function. Normal left ventricular size.  There is no left ventricular hypertrophy.   2. Left ventricular diastolic Doppler parameters are consistent with  impaired relaxation pattern of LV diastolic filling.   3. Global right ventricle has normal systolic function.The right  ventricular size is normal. No increase in right ventricular wall  thickness.   4. Left atrial size was mildly dilated.   5. Moderately elevated pulmonary artery systolic pressure. 45 mm Hg   6. Definity contrast agent was given IV to delineate the left  ventricular  endocardial borders. ___________   2D echo 12/01/2016: - Left ventricle: The cavity size was normal. Wall thickness was    increased in a pattern of mild LVH. Systolic function was normal.    The estimated  ejection fraction was in the range of 60% to 65%.    Wall motion was normal; there were no regional wall motion    abnormalities. Doppler parameters are consistent with abnormal    left ventricular relaxation (grade 1 diastolic dysfunction).  - Mitral valve: Calcified annulus.  - Left atrium: The atrium was mildly dilated.  - Pericardium, extracardiac: A trivial pericardial effusion was    identified. __________   Carlton Adam MPI 11/17/2016: Pharmacological myocardial perfusion imaging study with small mild region of ischemia noted in the mid to apical lateral wall Unable to definatively exclude attenuation artifact given high uptake in the inferoseptal wall.  Normal wall motion, EF estimated at 83% No EKG changes concerning for ischemia at peak stress or in recovery. Low risk scan given very small mild perfusion defect. __________   LHC 04/30/2015: Mid RCA lesion, 100% stenosed. Prox Cx lesion, 50% stenosed. Prox LAD lesion, 60% stenosed. Ost 1st Diag to 1st Diag lesion, 60% stenosed. Mid Cx to Dist Cx lesion, 90% stenosed. There is a 0% residual stenosis post intervention. A drug-eluting stent was placed.   1. Severe one-vessel coronary artery disease involving the mid left circumflex. Moderate proximal LAD stenosis not significant by FFR (0.83). Occluded small codominant mid RCA with left-to-right collaterals. 2. Normal LV systolic function by noninvasive testing. Mildly elevated left ventricular end-diastolic pressure. 3. Successful angioplasty and drug-eluting stent placement to the mid left circumflex.   Recommendations: Dual antiplatelets therapy for at least one year. Aggressive treatment of risk factors. __________   Jodie Echevaria MPI 04/22/2015: This is an intermediate risk study. Defect 1: There is a medium defect of moderate severity present in the basal inferolateral, mid inferolateral, apical inferior and apical lateral location. Findings consistent with ischemia. There was no  ST segment deviation noted during stress. T wave inversion of 3 mm was noted during stress in the lateral leads (V5, V6 and V2), beginning at 0 minutes of stress, ending at 1 minutes of stress, and returning to baseline after 1-5 mins of recovery. asymmetric The left ventricular ejection fraction is normal (55-65%).   If Clinical Conditions warrant, would recommend Cardiac Catheterization. __________   2D echo 04/23/2013: - Left ventricle: The cavity size was normal. Wall thickness    was increased in a pattern of mild LVH. The estimated    ejection fraction was 60%. Wall motion was normal; there    were no regional wall motion abnormalities. Findings    consistent with left ventricular diastolic dysfunction.  - Aortic valve: Sclerosis without stenosis. Mild    regurgitation.  - Mitral valve: Mildly to moderately calcified annulus.  - Tricuspid valve: Mild regurgitation.  - Pulmonary arteries: PA peak pressure: 27m Hg (S).   EKG:  EKG is ordered today.  The EKG ordered today demonstrates NSR, 86 bpm, RBBB, consistent with prior tracing  Recent Labs: 02/28/2022: BUN 15; Creatinine, Ser 0.81; Hemoglobin 11.7; Platelets 217.0; Potassium 4.8; Sodium 135 01/04/2023: ALT 68  Recent Lipid Panel    Component Value Date/Time   CHOL 141 02/28/2022 1318   CHOL 206 (H) 11/16/2016 1456   TRIG 125.0 02/28/2022 1318   HDL 44.20 02/28/2022 1318   HDL 54 11/16/2016 1456   CHOLHDL 3 02/28/2022 1318   VLDL 25.0 02/28/2022 1318  LDLCALC 72 02/28/2022 1318   LDLCALC 118 (H) 11/16/2016 1456    PHYSICAL EXAM:    VS:  BP (!) 150/70 (BP Location: Left Arm, Patient Position: Sitting, Cuff Size: Large)   Pulse 86   Ht 5' (1.524 m)   Wt 237 lb 3.2 oz (107.6 kg)   SpO2 97%   BMI 46.32 kg/m   BMI: Body mass index is 46.32 kg/m.  Physical Exam Vitals reviewed.  Constitutional:      Appearance: She is well-developed.  HENT:     Head: Normocephalic and atraumatic.  Eyes:     General:         Right eye: No discharge.        Left eye: No discharge.  Neck:     Vascular: No JVD.  Cardiovascular:     Rate and Rhythm: Normal rate and regular rhythm.     Heart sounds: S1 normal and S2 normal. Heart sounds not distant. No midsystolic click and no opening snap. Murmur heard.     Systolic murmur is present with a grade of 1/6 at the upper right sternal border.     No friction rub.  Pulmonary:     Effort: Pulmonary effort is normal. No respiratory distress.     Breath sounds: Normal breath sounds. No decreased breath sounds, wheezing or rales.  Chest:     Chest wall: No tenderness.  Abdominal:     General: There is no distension.  Musculoskeletal:     Cervical back: Normal range of motion.  Skin:    General: Skin is warm and dry.     Nails: There is no clubbing.  Neurological:     Mental Status: She is alert and oriented to person, place, and time.  Psychiatric:        Speech: Speech normal.        Behavior: Behavior normal.        Thought Content: Thought content normal.        Judgment: Judgment normal.     Wt Readings from Last 3 Encounters:  01/10/23 237 lb 3.2 oz (107.6 kg)  11/10/22 239 lb (108.4 kg)  11/01/22 240 lb (108.9 kg)     ASSESSMENT & PLAN:   CAD involving native coronary arteries without angina: She continues to do very well and is without symptoms of angina or decompensation.  No longer on a statin or ezetimibe given abnormal LFTs.  Continue aggressive risk factor modification including aspirin, isosorbide mononitrate, and metoprolol tartrate.  No indication for ischemic testing at this time.  We will pursue the addition of PCSK9 inhibitor as outlined below.  HTN: Blood pressure is elevated in the office today, though she reports normal blood pressures at home.  No changes were made in medical therapy.  HLD: LDL 72 in 02/2022.  With elevated LFTs, statin and ezetimibe ultimately been discontinued.  Given documented intolerance to atorvastatin and  rosuvastatin secondary to myalgias and in the context of progressive abnormal liver function testing, statin therapy and ezetimibe have been discontinued.  We will pursue PCSK9 inhibitor.  Aortic valve sclerosis: No evidence of stenosis from prior echo.  Abnormal LFTs: Despite discontinuation of atorvastatin and ezetimibe, LFTs remain elevated. Right upper quadrant ultrasound in 03/2022 concerning for hepatic steatosis and/or other hepatocellular disease.  Results were forwarded to PCP.  Recommend she follow-up with PCP for further evaluation and management.   Disposition: F/u with Dr. Fletcher Anon or an APP in 6 months.   Medication Adjustments/Labs and Tests  Ordered: Current medicines are reviewed at length with the patient today.  Concerns regarding medicines are outlined above. Medication changes, Labs and Tests ordered today are summarized above and listed in the Patient Instructions accessible in Encounters.   Signed, Christell Faith, PA-C 01/10/2023 5:36 PM     [address] [place], [place] [address] [phone]

## 2023-01-09 ENCOUNTER — Telehealth: Payer: Self-pay | Admitting: Family Medicine

## 2023-01-09 NOTE — Telephone Encounter (Signed)
LOV - 11/10/22 NOV - not scheduled RF - 11/10/22 #40/1

## 2023-01-09 NOTE — Telephone Encounter (Signed)
Prescription Request  01/09/2023  Is this a "Controlled Substance" medicine? No  LOV: 11/10/2022  What is the name of the medication or equipment? gabapentin (NEURONTIN) 100 MG capsule   Have you contacted your pharmacy to request a refill? Yes   Which pharmacy would you like this sent to?   [facility] [place], [place]   Patient notified that their request is being sent to the clinical staff for review and that they should receive a response within 2 business days.   Please advise at Mobile [phone] (mobile)

## 2023-01-10 ENCOUNTER — Encounter: Payer: Self-pay | Admitting: Physician Assistant

## 2023-01-10 ENCOUNTER — Ambulatory Visit: Payer: Medicare HMO | Attending: Physician Assistant | Admitting: Physician Assistant

## 2023-01-10 VITALS — BP 150/70 | HR 86 | Ht 60.0 in | Wt 237.2 lb

## 2023-01-10 DIAGNOSIS — I358 Other nonrheumatic aortic valve disorders: Secondary | ICD-10-CM | POA: Diagnosis not present

## 2023-01-10 DIAGNOSIS — E785 Hyperlipidemia, unspecified: Secondary | ICD-10-CM

## 2023-01-10 DIAGNOSIS — I2510 Atherosclerotic heart disease of native coronary artery without angina pectoris: Secondary | ICD-10-CM | POA: Diagnosis not present

## 2023-01-10 DIAGNOSIS — R7989 Other specified abnormal findings of blood chemistry: Secondary | ICD-10-CM | POA: Diagnosis not present

## 2023-01-10 DIAGNOSIS — I10 Essential (primary) hypertension: Secondary | ICD-10-CM | POA: Diagnosis not present

## 2023-01-10 MED ORDER — GABAPENTIN 100 MG PO CAPS: 100.0000 mg | ORAL_CAPSULE | Freq: Three times a day (TID) | ORAL | 1 refills | Status: DC | PRN

## 2023-01-10 MED ORDER — REPATHA 140 MG/ML ~~LOC~~ SOSY: 140.0000 mg | PREFILLED_SYRINGE | SUBCUTANEOUS | 11 refills | Status: DC

## 2023-01-10 NOTE — Patient Instructions (Signed)
Medication Instructions:  Your physician has recommended you make the following change in your medication:   START Repatha 140 mg every 2 weeks. This may take some time for insurance processing.   *If you need a refill on your cardiac medications before your next appointment, please call your pharmacy*   Lab Work: None  If you have labs (blood work) drawn today and your tests are completely normal, you will receive your results only by: [place] (if you have MyChart) OR A paper copy in the mail If you have any lab test that is abnormal or we need to change your treatment, we will call you to review the results.   Testing/Procedures: None   Follow-Up: At [facility], you and your health needs are our priority.  As part of our continuing mission to provide you with exceptional heart care, we have created designated Provider Care Teams.  These Care Teams include your primary Cardiologist (physician) and Advanced Practice Providers (APPs -  Physician Assistants and Nurse Practitioners) who all work together to provide you with the care you need, when you need it.   Your next appointment:   6 month(s)  Provider:   Kathlyn Sacramento, MD or Christell Faith, PA-C

## 2023-01-10 NOTE — Telephone Encounter (Signed)
Sent. Thanks.   

## 2023-01-11 ENCOUNTER — Other Ambulatory Visit: Payer: Self-pay | Admitting: Family Medicine

## 2023-01-11 DIAGNOSIS — R7989 Other specified abnormal findings of blood chemistry: Secondary | ICD-10-CM

## 2023-02-22 ENCOUNTER — Telehealth: Payer: Self-pay | Admitting: Family Medicine

## 2023-02-22 NOTE — Telephone Encounter (Signed)
Called patient to schedule Medicare Annual Wellness Visit (AWV). No voicemail available to leave a message.  Last date of AWV: 01/25/2022  Please schedule an appointment at any time with [facility].  If any questions, please contact me at [phone].  Thank you ,  [place] Direct Dial: [phone]

## 2023-02-28 ENCOUNTER — Ambulatory Visit (INDEPENDENT_AMBULATORY_CARE_PROVIDER_SITE_OTHER): Payer: Medicare HMO

## 2023-02-28 VITALS — Wt 237.0 lb

## 2023-02-28 DIAGNOSIS — Z0000 Encounter for general adult medical examination without abnormal findings: Secondary | ICD-10-CM | POA: Diagnosis not present

## 2023-02-28 NOTE — Progress Notes (Signed)
I connected with  Andrea PaganiniLeslie K Narula on 02/28/23 by a audio enabled telemedicine application and verified that I am speaking with the correct person using two identifiers.  Patient Location: Home  Provider Location: Office/Clinic  I discussed the limitations of evaluation and management by telemedicine. The patient expressed understanding and agreed to proceed.   Subjective:   Andrea Webster is a 67 y.o. female who presents for Medicare Annual (Subsequent) preventive examination.  Review of Systems     Cardiac Risk Factors include: advanced age (>8555men, 99>65 women)     Objective:    Today's Vitals   02/28/23 1139  Weight: 237 lb (107.5 kg)   Body mass index is 46.29 kg/m.     02/28/2023   11:42 AM 01/25/2022   11:18 AM 06/26/2020    6:52 AM 04/29/2020   11:06 AM 04/23/2020    4:14 PM 09/05/2019    1:09 PM 09/04/2019   11:00 PM  Advanced Directives  Does Patient Have a Medical Advance Directive? Yes No No No No No No  Type of Estate agentAdvance Directive Healthcare Power of [place];Living will        Copy of Healthcare Power of Attorney in Chart? No - copy requested        Would patient like information on creating a medical advance directive?  Yes (MAU/Ambulatory/Procedural Areas - Information given)  No - Patient declined Yes (MAU/Ambulatory/Procedural Areas - Information given) No - Patient declined Yes (Inpatient - patient requests chaplain consult to create a medical advance directive)    Current Medications (verified) Outpatient Encounter Medications as of 02/28/2023  Medication Sig   albuterol (VENTOLIN HFA) 108 (90 Base) MCG/ACT inhaler INHALE 2 PUFFS EVERY 6 HOURS AS NEEDED FOR WHEEZING OR SHORTNESS OF BREATH   ALPRAZolam (XANAX) 1 MG tablet TAKE 1 TABLET THREE TIMES DAILY AS NEEDED FOR ANXIETY   aspirin 81 MG EC tablet Take 81 mg by mouth daily.    cetirizine (ZYRTEC) 10 MG tablet Take 10 mg by mouth daily.   Cholecalciferol (VITAMIN D) 50 MCG (2000 UT) CAPS Take 2,000 Units by mouth  daily.    cyclobenzaprine (FLEXERIL) 10 MG tablet TAKE 1/2 TO 1 TABLET AT BEDTIME   fluticasone (FLONASE) 50 MCG/ACT nasal spray USE 2 SPRAYS IN EACH NOSTRIL ONE TIME DAILY   gabapentin (NEURONTIN) 100 MG capsule Take 1-2 capsules (100-200 mg total) by mouth 3 (three) times daily as needed (for pain).   isosorbide mononitrate (IMDUR) 60 MG 24 hr tablet TAKE 1 TABLET EVERY DAY   ketotifen (ZADITOR) 0.025 % ophthalmic solution Place 1 drop into both eyes 2 (two) times daily.   metoprolol tartrate (LOPRESSOR) 25 MG tablet TAKE 1 TABLET TWICE DAILY   Multiple Vitamins-Minerals (MULTIVITAMIN WITH MINERALS) tablet Take 1 tablet by mouth daily.   nitroGLYCERIN (NITROSTAT) 0.4 MG SL tablet DISSOLVE 1 TABLET UNDER THE TONGUE EVERY 5 MINUTES AS NEEDED FOR CHEST PAIN AS DIRECTED   pantoprazole (PROTONIX) 40 MG tablet TAKE 1 TABLET EVERY DAY   senna (SENOKOT) 8.6 MG tablet Take 1 tablet by mouth at bedtime. Takes at night   zolpidem (AMBIEN) 10 MG tablet TAKE 1 TABLET BY MOUTH AT BEDTIME AS NEEDED FOR SLEEP   Evolocumab (REPATHA) 140 MG/ML SOSY Inject 140 mg into the skin every 14 (fourteen) days. (Patient not taking: Reported on 02/28/2023)   [DISCONTINUED] NYSTATIN powder APPLY  POWDER TOPICALLY THREE TIMES DAILY (Patient not taking: Reported on 01/10/2023)   [DISCONTINUED] sulfamethoxazole-trimethoprim (BACTRIM DS) 800-160 MG tablet Take 1  tablet by mouth 2 (two) times daily. (Patient not taking: Reported on 01/10/2023)   No facility-administered encounter medications on file as of 02/28/2023.    Allergies (verified) Adhesive [tape], Paroxetine hcl, Crestor [rosuvastatin calcium], Effexor [venlafaxine], Lipitor [atorvastatin], Sertraline hcl, and Wellbutrin [bupropion hcl]   History: Past Medical History:  Diagnosis Date   Anxiety    Arthritis    Baker's cyst    x 2   CAD (coronary artery disease)    a. s/p MI & PTCA - age 29;  b. 04/2015 PCI: LM nl, LAD 60p (FFR 0.83), D1 60ost, LCX 50p, 78m (2.75x15  Xience Alpine DES), OM1/2/3 min irregs, RCA 119m CTO; c. 08/2019 PCI: LM nl, LAD mod prox dzs, LCX patent stent w/ severe prox dzs (DES), RCA CTO mid.   Chronic sciatica    Complication of anesthesia    Constipation    Depression    intolerant of paxil, wellbutrin, zoloft   Diastolic dysfunction    a. 08/2019 Echo: EF 60-65%, gr1 DD, PASP .   GERD (gastroesophageal reflux disease)    Hayfever    headaches    Heart disease    Heart murmur    Heart valve disease    High cholesterol    Hypertension    Incisional hernia    Insomnia    Myocardial infarction 08/2019   stented. first MI at age 77   Plantar fasciitis    Primary insomnia    Rectal polyp 03/2020   RLS (restless legs syndrome)    Sciatica    Sleep apnea    snores very loudly   Smoker    a. quit 01/2014.   Past Surgical History:  Procedure Laterality Date   ANGIOPLASTY  1997   APPENDECTOMY  1978   open   CARDIAC CATHETERIZATION     [facility] [place]   CARDIAC CATHETERIZATION N/A 04/30/2015   Procedure: Left Heart Cath;  Surgeon: Iran Ouch, MD;  Location: [facility] INVASIVE CV LAB;  Service: Cardiovascular;  Laterality: N/A;   CARPAL TUNNEL RELEASE     CESAREAN SECTION  1980   CESAREAN SECTION  1984   CHOLECYSTECTOMY N/A 03/30/2016   Procedure: LAPAROSCOPIC CHOLECYSTECTOMY WITH INTRAOPERATIVE CHOLANGIOGRAM;  Surgeon: Leafy Ro, MD;  Location: [facility] ORS;  Service: General;  Laterality: N/A;   COLONOSCOPY WITH PROPOFOL N/A 06/26/2020   Procedure: COLONOSCOPY WITH PROPOFOL;  Surgeon: Earline Mayotte, MD;  Location: [facility] ENDOSCOPY;  Service: Endoscopy;  Laterality: N/A;   CORONARY ANGIOPLASTY     [facility]    [facility]ON[facility]   CORONARY STENT INTERVENTION N/A 09/05/2019   Procedure: CORONARY STENT INTERVENTION;  Surgeon: Marcina Millard, MD;  Location: [facility] INVASIVE CV LAB;  Service: Cardiovascular;  Laterality: N/A;    ESOPHAGOGASTRODUODENOSCOPY (EGD) WITH PROPOFOL N/A 06/26/2020   Procedure: ESOPHAGOGASTRODUODENOSCOPY (EGD) WITH PROPOFOL;  Surgeon: Earline Mayotte, MD;  Location: [facility] ENDOSCOPY;  Service: Endoscopy;  Laterality: N/A;   EXCISION OF MESH N/A 04/29/2020   Procedure: EXCISION OF MESH;  Surgeon: Earline Mayotte, MD;  Location: [facility] ORS;  Service: General;  Laterality: N/A;   HERNIA REPAIR  11/2016   [facility]; Dr. Kandice Hams   LEFT HEART CATH AND CORONARY ANGIOGRAPHY N/A 09/05/2019   Procedure: LEFT HEART CATH AND CORONARY ANGIOGRAPHY;  Surgeon: Antonieta Iba, MD;  Location: [facility] INVASIVE CV LAB;  Service: Cardiovascular;  Laterality: N/A;   VAGINAL HYSTERECTOMY  VENTRAL HERNIA REPAIR N/A 04/29/2020   Procedure: HERNIA REPAIR VENTRAL ADULT;  Surgeon: Earline Mayotte, MD;  Location: [facility] ORS;  Service: General;  Laterality: N/A;  Ventral hernia repair with component separation- General anesthesia and exparel block; Laneta Simmers, RNFA to assist   Family History  Problem Relation Age of Onset   Heart disease Mother    Diabetes Mother    Hypertension Mother    Heart attack Mother    Coronary artery disease Mother    Alcohol abuse Father    Heart disease Father    Heart attack Father    Coronary artery disease Father    Diabetes Brother    Hypertension Brother    Breast cancer Maternal Grandmother 14   Colon cancer Neg Hx    Social History   Socioeconomic History   Marital status: Significant Other    Spouse name: TIM   Number of children: Not on file   Years of education: Not on file   Highest education level: Not on file  Occupational History   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, 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    Comment: disabled  Tobacco Use   Smoking status: Former    Packs/day: 0.33    Years: 20.00    Additional pack years: 0.00    Total pack years: 6.60    Types: Cigarettes    Quit date: 02/13/2015    Years since quitting: 8.0   Smokeless tobacco: Never  Vaping Use    Vaping Use: Never used  Substance and Sexual Activity   Alcohol use: Not Currently    Alcohol/week: 0.0 standard drinks of alcohol   Drug use: No   Sexual activity: Not on file  Other Topics Concern   Not on file  Social History Narrative   From [place], to [place] 2011   Widow as of 2001   1 son prev incarcerated, out as of 2019, in contact as of 2023   1 daughter   Lives with significant other, Tim   Social Determinants of Health   Financial Resource Strain: Low Risk  (02/28/2023)   Overall Financial Resource Strain (CARDIA)    Difficulty of Paying Living Expenses: Not hard at all  Food Insecurity: No Food Insecurity (02/28/2023)   Hunger Vital Sign    Worried About Running Out of Food in the Last Year: Never true    Ran Out of Food in the Last Year: Never true  Transportation Needs: No Transportation Needs (02/28/2023)   PRAPARE - Administrator, Civil Service (Medical): No    Lack of Transportation (Non-Medical): No  Physical Activity: Sufficiently Active (02/28/2023)   Exercise Vital Sign    Days of Exercise per Week: 7 days    Minutes of Exercise per Session: 30 min  Stress: No Stress Concern Present (02/28/2023)   [employer] of Occupational Health - Occupational Stress Questionnaire    Feeling of Stress : Not at all  Social Connections: Moderately Isolated (02/28/2023)   Social Connection and Isolation Panel [NHANES]    Frequency of Communication with Friends and Family: More than three times a week    Frequency of Social Gatherings with Friends and Family: Once a week    Attends Religious Services: More than 4 times per year    Active Member of [employer] or Organizations: No    Attends Banker Meetings: Never    Marital Status: Never married    Tobacco Counseling Counseling given: Not Answered   Clinical  Intake:  Pre-visit preparation completed: Yes  Pain : No/denies pain     BMI - recorded: 46.29 Nutritional Status: BMI > 30   Obese Nutritional Risks: None Diabetes: No  How often do you need to have someone help you when you read instructions, pamphlets, or other written materials from your doctor or pharmacy?: 1 - Never  Diabetic?no  Interpreter Needed?: No  Information entered by :: Lanier Ensign, LPN   Activities of Daily Living    02/28/2023   11:44 AM  In your present state of health, do you have any difficulty performing the following activities:  Hearing? 0  Vision? 0  Difficulty concentrating or making decisions? 0  Walking or climbing stairs? 0  Dressing or bathing? 0  Doing errands, shopping? 0  Preparing Food and eating ? N  Using the Toilet? N  In the past six months, have you accidently leaked urine? N  Do you have problems with loss of bowel control? N  Managing your Medications? N  Managing your Finances? N  Housekeeping or managing your Housekeeping? N    Patient Care Team: Joaquim Nam, MD as PCP - General (Family Medicine) Iran Ouch, MD as PCP - Cardiology (Cardiology) Iran Ouch, MD as Consulting Physician (Cardiology) Geoffry Paradise, MD as Referring Physician [facility])  Indicate any recent Medical Services you may have received from other than [facility] providers in the past year (date may be approximate).     Assessment:   This is a routine wellness examination for [place].  Hearing/Vision screen Hearing Screening - Comments:: Pt denies any hearing issues  Vision Screening - Comments:: Pt follows up with Dr Felipa Evener for annual eye exams   Dietary issues and exercise activities discussed: Current Exercise Habits: Home exercise routine, Type of exercise: walking, Time (Minutes): 30, Frequency (Times/Week): 7, Weekly Exercise (Minutes/Week): 210   Goals Addressed             This Visit's Progress    Patient Stated       None at this time        Depression Screen    02/28/2023   11:43 AM 01/25/2022   11:20 AM 02/15/2018    1:16 PM 01/23/2017     1:47 PM 06/30/2014   10:07 AM  PHQ 2/9 Scores  PHQ - 2 Score 0 0 3 0 0  PHQ- 9 Score   5      Fall Risk    02/28/2023   11:44 AM 01/25/2022   11:19 AM 02/15/2018    1:16 PM 01/23/2017    1:47 PM 06/30/2014   10:07 AM  Fall Risk   Falls in the past year? 0 0 No No Yes  Number falls in past yr: 0 0     Injury with Fall? 0 0     Risk for fall due to : Impaired vision No Fall Risks     Follow up Falls prevention discussed Falls prevention discussed       FALL RISK PREVENTION PERTAINING TO THE HOME:  Any stairs in or around the home? No  If so, are there any without handrails? No  Home free of loose throw rugs in walkways, pet beds, electrical cords, etc? Yes  Adequate lighting in your home to reduce risk of falls? Yes   ASSISTIVE DEVICES UTILIZED TO PREVENT FALLS:  Life alert? Yes  Use of a cane, walker or w/c? No  Grab bars in the bathroom? No  Shower chair or  bench in shower? Yes  Elevated toilet seat or a handicapped toilet? No   TIMED UP AND GO:  Was the test performed? No .    Cognitive Function:    02/15/2018    1:35 PM 01/23/2017    1:47 PM  MMSE - Mini Mental State Exam  Orientation to time 5 5  Orientation to Place 5 5  Registration 3 3  Attention/ Calculation 0 0  Recall 3 3  Language- name 2 objects 0 0  Language- repeat 1 1  Language- follow 3 step command 3 3  Language- read & follow direction 0 0  Write a sentence 0 0  Copy design 0 0  Total score 20 20        02/28/2023   11:44 AM  6CIT Screen  What Year? 0 points  What month? 0 points  What time? 0 points  Count back from 20 0 points  Months in reverse 2 points  Repeat phrase 0 points  Total Score 2 points    Immunizations Immunization History  Administered Date(s) Administered   Influenza Split 11/01/2011   Influenza, Seasonal, Injecte, Preservative Fre 11/23/2012   Influenza,inj,Quad PF,6+ Mos 08/29/2013, 10/03/2014, 08/26/2015, 08/08/2016, 09/05/2017, 08/30/2018, 08/13/2019, 07/30/2020,  09/17/2021, 09/16/2022   Influenza-Unspecified 08/01/2016   PFIZER(Purple Top)SARS-COV-2 Vaccination 05/16/2020, 06/06/2020   Pneumococcal Polysaccharide-23 08/26/2015   Tdap 04/11/2013    TDAP status: Up to date  Flu Vaccine status: Up to date  Pneumococcal vaccine status: Due, Education has been provided regarding the importance of this vaccine. Advised may receive this vaccine at local pharmacy or Health Dept. Aware to provide a copy of the vaccination record if obtained from local pharmacy or Health Dept. Verbalized acceptance and understanding.  Covid-19 vaccine status: Completed vaccines  Qualifies for Shingles Vaccine? Yes   Zostavax completed No   Shingrix Completed?: No.    Education has been provided regarding the importance of this vaccine. Patient has been advised to call insurance company to determine out of pocket expense if they have not yet received this vaccine. Advised may also receive vaccine at local pharmacy or Health Dept. Verbalized acceptance and understanding.  Screening Tests Health Maintenance  Topic Date Due   Zoster Vaccines- Shingrix (1 of 2) Never done   Pneumonia Vaccine 68+ Years old (2 of 2 - PCV) 09/19/2022   DEXA SCAN  Never done   MAMMOGRAM  03/28/2023   DTaP/Tdap/Td (2 - Td or Tdap) 04/12/2023   INFLUENZA VACCINE  06/22/2023   COLONOSCOPY (Pts 45-62yrs Insurance coverage will need to be confirmed)  06/27/2023   Medicare Annual Wellness (AWV)  02/28/2024   Hepatitis C Screening  Completed   HIV Screening  Completed   HPV VACCINES  Aged Out   COVID-19 Vaccine  Discontinued    Health Maintenance  Health Maintenance Due  Topic Date Due   Zoster Vaccines- Shingrix (1 of 2) Never done   Pneumonia Vaccine 61+ Years old (2 of 2 - PCV) 09/19/2022   DEXA SCAN  Never done    Colorectal cancer screening: Type of screening: Colonoscopy. Completed 06/26/20. Repeat every 3 years  Mammogram status: Completed 03/27/21. Repeat every  year     Additional Screening:  Hepatitis C Screening: Completed 01/23/17  Vision Screening: Recommended annual ophthalmology exams for early detection of glaucoma and other disorders of the eye. Is the patient up to date with their annual eye exam?  Yes  Who is the provider or what is the name of the office in  which the patient attends annual eye exams? Dr Ezequiel Essex  If pt is not established with a provider, would they like to be referred to a provider to establish care? No .   Dental Screening: Recommended annual dental exams for proper oral hygiene  Community Resource Referral / Chronic Care Management: CRR required this visit?  No   CCM required this visit?  No      Plan:     I have personally reviewed and noted the following in the patient's chart:   Medical and social history Use of alcohol, tobacco or illicit drugs  Current medications and supplements including opioid prescriptions. Patient is not currently taking opioid prescriptions. Functional ability and status Nutritional status Physical activity Advanced directives List of other physicians Hospitalizations, surgeries, and ER visits in previous 12 months Vitals Screenings to include cognitive, depression, and falls Referrals and appointments  In addition, I have reviewed and discussed with patient certain preventive protocols, quality metrics, and best practice recommendations. A written personalized care plan for preventive services as well as general preventive health recommendations were provided to patient.     Marzella Schlein, LPN   4/0/9811   Nurse Notes: none

## 2023-02-28 NOTE — Patient Instructions (Signed)
Andrea Webster , Thank you for taking time to come for your Medicare Wellness Visit. I appreciate your ongoing commitment to your health goals. Please review the following plan we discussed and let me know if I can assist you in the future.   These are the goals we discussed:  Goals      Increase physical activity     Starting 01/23/17, I will continue to walk for 30 min daily.      Increase physical activity     Starting 02/15/2018, I will continue to take medications as prescribed and to keep appointments with PCP with scheduled.      Patient Stated     Would like to continue walking routine        This is a list of the screening recommended for you and due dates:  Health Maintenance  Topic Date Due   Zoster (Shingles) Vaccine (1 of 2) Never done   Pneumonia Vaccine (2 of 2 - PCV) 09/19/2022   DEXA scan (bone density measurement)  Never done   Medicare Annual Wellness Visit  01/26/2023   Mammogram  03/28/2023   DTaP/Tdap/Td vaccine (2 - Td or Tdap) 04/12/2023   Flu Shot  06/22/2023   Colon Cancer Screening  06/27/2023   Hepatitis C Screening: USPSTF Recommendation to screen - Ages 18-79 yo.  Completed   HIV Screening  Completed   HPV Vaccine  Aged Out   COVID-19 Vaccine  Discontinued    Advanced directives: none at this time   Conditions/risks identified: Please bring a copy of your health care power of attorney and living will to the office at your convenience.  Next appointment: Follow up in one year for your annual wellness visit    Preventive Care 65 Years and Older, Female Preventive care refers to lifestyle choices and visits with your health care provider that can promote health and wellness. What does preventive care include? A yearly physical exam. This is also called an annual well check. Dental exams once or twice a year. Routine eye exams. Ask your health care provider how often you should have your eyes checked. Personal lifestyle choices, including: Daily care  of your teeth and gums. Regular physical activity. Eating a healthy diet. Avoiding tobacco and drug use. Limiting alcohol use. Practicing safe sex. Taking low-dose aspirin every day. Taking vitamin and mineral supplements as recommended by your health care provider. What happens during an annual well check? The services and screenings done by your health care provider during your annual well check will depend on your age, overall health, lifestyle risk factors, and family history of disease. Counseling  Your health care provider may ask you questions about your: Alcohol use. Tobacco use. Drug use. Emotional well-being. Home and relationship well-being. Sexual activity. Eating habits. History of falls. Memory and ability to understand (cognition). Work and work Astronomer. Reproductive health. Screening  You may have the following tests or measurements: Height, weight, and BMI. Blood pressure. Lipid and cholesterol levels. These may be checked every 5 years, or more frequently if you are over 52 years old. Skin check. Lung cancer screening. You may have this screening every year starting at age 78 if you have a 30-pack-year history of smoking and currently smoke or have quit within the past 15 years. Fecal occult blood test (FOBT) of the stool. You may have this test every year starting at age 64. Flexible sigmoidoscopy or colonoscopy. You may have a sigmoidoscopy every 5 years or a colonoscopy every 10  years starting at age 51. Hepatitis C blood test. Hepatitis B blood test. Sexually transmitted disease (STD) testing. Diabetes screening. This is done by checking your blood sugar (glucose) after you have not eaten for a while (fasting). You may have this done every 1-3 years. Bone density scan. This is done to screen for osteoporosis. You may have this done starting at age 66. Mammogram. This may be done every 1-2 years. Talk to your health care provider about how often you  should have regular mammograms. Talk with your health care provider about your test results, treatment options, and if necessary, the need for more tests. Vaccines  Your health care provider may recommend certain vaccines, such as: Influenza vaccine. This is recommended every year. Tetanus, diphtheria, and acellular pertussis (Tdap, Td) vaccine. You may need a Td booster every 10 years. Zoster vaccine. You may need this after age 53. Pneumococcal 13-valent conjugate (PCV13) vaccine. One dose is recommended after age 91. Pneumococcal polysaccharide (PPSV23) vaccine. One dose is recommended after age 5. Talk to your health care provider about which screenings and vaccines you need and how often you need them. This information is not intended to replace advice given to you by your health care provider. Make sure you discuss any questions you have with your health care provider. Document Released: 12/04/2015 Document Revised: 07/27/2016 Document Reviewed: 09/08/2015 Elsevier Interactive Patient Education  2017 [place] Prevention in the Home Falls can cause injuries. They can happen to people of all ages. There are many things you can do to make your home safe and to help prevent falls. What can I do on the outside of my home? Regularly fix the edges of walkways and driveways and fix any cracks. Remove anything that might make you trip as you walk through a door, such as a raised step or threshold. Trim any bushes or trees on the path to your home. Use bright outdoor lighting. Clear any walking paths of anything that might make someone trip, such as rocks or tools. Regularly check to see if handrails are loose or broken. Make sure that both sides of any steps have handrails. Any raised decks and porches should have guardrails on the edges. Have any leaves, snow, or ice cleared regularly. Use sand or salt on walking paths during winter. Clean up any spills in your garage right away.  This includes oil or grease spills. What can I do in the bathroom? Use night lights. Install grab bars by the toilet and in the tub and shower. Do not use towel bars as grab bars. Use non-skid mats or decals in the tub or shower. If you need to sit down in the shower, use a plastic, non-slip stool. Keep the floor dry. Clean up any water that spills on the floor as soon as it happens. Remove soap buildup in the tub or shower regularly. Attach bath mats securely with double-sided non-slip rug tape. Do not have throw rugs and other things on the floor that can make you trip. What can I do in the bedroom? Use night lights. Make sure that you have a light by your bed that is easy to reach. Do not use any sheets or blankets that are too big for your bed. They should not hang down onto the floor. Have a firm chair that has side arms. You can use this for support while you get dressed. Do not have throw rugs and other things on the floor that can make you trip.  What can I do in the kitchen? Clean up any spills right away. Avoid walking on wet floors. Keep items that you use a lot in easy-to-reach places. If you need to reach something above you, use a strong step stool that has a grab bar. Keep electrical cords out of the way. Do not use floor polish or wax that makes floors slippery. If you must use wax, use non-skid floor wax. Do not have throw rugs and other things on the floor that can make you trip. What can I do with my stairs? Do not leave any items on the stairs. Make sure that there are handrails on both sides of the stairs and use them. Fix handrails that are broken or loose. Make sure that handrails are as long as the stairways. Check any carpeting to make sure that it is firmly attached to the stairs. Fix any carpet that is loose or worn. Avoid having throw rugs at the top or bottom of the stairs. If you do have throw rugs, attach them to the floor with carpet tape. Make sure that  you have a light switch at the top of the stairs and the bottom of the stairs. If you do not have them, ask someone to add them for you. What else can I do to help prevent falls? Wear shoes that: Do not have high heels. Have rubber bottoms. Are comfortable and fit you well. Are closed at the toe. Do not wear sandals. If you use a stepladder: Make sure that it is fully opened. Do not climb a closed stepladder. Make sure that both sides of the stepladder are locked into place. Ask someone to hold it for you, if possible. Clearly mark and make sure that you can see: Any grab bars or handrails. First and last steps. Where the edge of each step is. Use tools that help you move around (mobility aids) if they are needed. These include: Canes. Walkers. Scooters. Crutches. Turn on the lights when you go into a dark area. Replace any light bulbs as soon as they burn out. Set up your furniture so you have a clear path. Avoid moving your furniture around. If any of your floors are uneven, fix them. If there are any pets around you, be aware of where they are. Review your medicines with your doctor. Some medicines can make you feel dizzy. This can increase your chance of falling. Ask your doctor what other things that you can do to help prevent falls. This information is not intended to replace advice given to you by your health care provider. Make sure you discuss any questions you have with your health care provider. Document Released: 09/03/2009 Document Revised: 04/14/2016 Document Reviewed: 12/12/2014 Elsevier Interactive Patient Education  2017 [employer].

## 2023-03-03 ENCOUNTER — Other Ambulatory Visit: Payer: Self-pay | Admitting: Family Medicine

## 2023-03-03 ENCOUNTER — Other Ambulatory Visit (INDEPENDENT_AMBULATORY_CARE_PROVIDER_SITE_OTHER): Payer: Medicare HMO

## 2023-03-03 DIAGNOSIS — R7989 Other specified abnormal findings of blood chemistry: Secondary | ICD-10-CM

## 2023-03-03 NOTE — Addendum Note (Signed)
Addended by: Jef Futch P on: 03/03/2023 02:14 PM   Modules accepted: Orders  

## 2023-03-03 NOTE — Addendum Note (Signed)
Addended by: Eual Fines on: 03/03/2023 02:14 PM   Modules accepted: Orders

## 2023-03-03 NOTE — Telephone Encounter (Signed)
Refill request for ALPRAZOLAM 1 MG Tablet   LOV - 11/10/22 Next OV - 03/10/23 Last refill - 12/02/22 #90/1

## 2023-03-03 NOTE — Addendum Note (Signed)
Addended by: Pilar Corrales P on: 03/03/2023 02:14 PM   Modules accepted: Orders  

## 2023-03-04 LAB — LIPID PANEL
Cholesterol: 214 mg/dL — ABNORMAL HIGH (ref <200)
HDL: 40 mg/dL — ABNORMAL LOW (ref >=50)
LDL Cholesterol (Calc): 143 mg/dL (calc) — ABNORMAL HIGH
Non-HDL Cholesterol (Calc): 174 mg/dL (calc) — ABNORMAL HIGH (ref <130)
Total CHOL/HDL Ratio: 5.4 (calc) — ABNORMAL HIGH (ref <5.0)
Triglycerides: 177 mg/dL — ABNORMAL HIGH (ref <150)

## 2023-03-04 LAB — HEPATITIS PANEL, ACUTE
Hep A IgM: NONREACTIVE
Hep B C IgM: NONREACTIVE
Hepatitis B Surface Ag: NONREACTIVE
Hepatitis C Ab: NONREACTIVE

## 2023-03-04 LAB — COMPREHENSIVE METABOLIC PANEL
AG Ratio: 1.2 (calc) (ref 1.0–2.5)
ALT: 63 U/L — ABNORMAL HIGH (ref 6–29)
AST: 70 U/L — ABNORMAL HIGH (ref 10–35)
Albumin: 3.9 g/dL (ref 3.6–5.1)
Alkaline phosphatase (APISO): 110 U/L (ref 37–153)
BUN: 11 mg/dL (ref 7–25)
CO2: 24 mmol/L (ref 20–32)
Calcium: 9.2 mg/dL (ref 8.6–10.4)
Chloride: 102 mmol/L (ref 98–110)
Creat: 0.78 mg/dL (ref 0.50–1.05)
Globulin: 3.2 g/dL (calc) (ref 1.9–3.7)
Glucose, Bld: 105 mg/dL — ABNORMAL HIGH (ref 65–99)
Potassium: 4.3 mmol/L (ref 3.5–5.3)
Sodium: 138 mmol/L (ref 135–146)
Total Bilirubin: 0.9 mg/dL (ref 0.2–1.2)
Total Protein: 7.1 g/dL (ref 6.1–8.1)

## 2023-03-04 LAB — CBC WITH DIFFERENTIAL/PLATELET
Absolute Monocytes: 515 cells/uL (ref 200–950)
Basophils Absolute: 40 cells/uL (ref 0–200)
Basophils Relative: 0.6 %
Eosinophils Absolute: 139 cells/uL (ref 15–500)
Eosinophils Relative: 2.1 %
HCT: 37.1 % (ref 35.0–45.0)
Hemoglobin: 12.4 g/dL (ref 11.7–15.5)
Lymphs Abs: 1393 cells/uL (ref 850–3900)
MCH: 28.8 pg (ref 27.0–33.0)
MCHC: 33.4 g/dL (ref 32.0–36.0)
MCV: 86.3 fL (ref 80.0–100.0)
MPV: 10.6 fL (ref 7.5–12.5)
Monocytes Relative: 7.8 %
Neutro Abs: 4514 cells/uL (ref 1500–7800)
Neutrophils Relative %: 68.4 %
Platelets: 223 10*3/uL (ref 140–400)
RBC: 4.3 10*6/uL (ref 3.80–5.10)
RDW: 14.1 % (ref 11.0–15.0)
Total Lymphocyte: 21.1 %
WBC: 6.6 10*3/uL (ref 3.8–10.8)

## 2023-03-10 ENCOUNTER — Encounter: Payer: Medicare HMO | Admitting: Family Medicine

## 2023-03-20 ENCOUNTER — Other Ambulatory Visit: Payer: Self-pay | Admitting: Family Medicine

## 2023-03-21 NOTE — Telephone Encounter (Signed)
Refill request for zolpidem (AMBIEN) 10 MG tablet   LOV - 11/10/22 Next OV - 03/23/23 Last refill - 12/31/22 #90/0

## 2023-03-23 ENCOUNTER — Encounter: Payer: Self-pay | Admitting: Family Medicine

## 2023-03-23 ENCOUNTER — Ambulatory Visit (INDEPENDENT_AMBULATORY_CARE_PROVIDER_SITE_OTHER): Payer: Medicare HMO | Admitting: Family Medicine

## 2023-03-23 VITALS — BP 120/62 | HR 94 | Temp 97.3°F | Ht 60.0 in | Wt 239.0 lb

## 2023-03-23 DIAGNOSIS — G4700 Insomnia, unspecified: Secondary | ICD-10-CM

## 2023-03-23 DIAGNOSIS — E785 Hyperlipidemia, unspecified: Secondary | ICD-10-CM

## 2023-03-23 DIAGNOSIS — I2510 Atherosclerotic heart disease of native coronary artery without angina pectoris: Secondary | ICD-10-CM | POA: Diagnosis not present

## 2023-03-23 DIAGNOSIS — F419 Anxiety disorder, unspecified: Secondary | ICD-10-CM

## 2023-03-23 DIAGNOSIS — Z6841 Body Mass Index (BMI) 40.0 and over, adult: Secondary | ICD-10-CM | POA: Diagnosis not present

## 2023-03-23 DIAGNOSIS — E2839 Other primary ovarian failure: Secondary | ICD-10-CM

## 2023-03-23 DIAGNOSIS — Z0000 Encounter for general adult medical examination without abnormal findings: Secondary | ICD-10-CM

## 2023-03-23 DIAGNOSIS — Z7189 Other specified counseling: Secondary | ICD-10-CM

## 2023-03-23 NOTE — Progress Notes (Signed)
Anxiety. She is trying to get her guardianship arrangements set up, she is still caring for her brother.  Her sig other is chronically ill at baseline. She is managing as is.  Intolerant of mult meds. Using xanax prn.    Insomnia.  Ambien helps.  No ADE on med.  Compliant.  Slept better with medication use.  She is off gabapentin since sciatica pain is better.    Elevated Cholesterol: Soon to start repatha.   Diet compliance: d/w pt.   Exercise: d/w pt.  Labs d/w pt.    CAD.  Using medication without problems or lightheadedness: yes Chest pain with exertion:no Edema:no Short of breath:no  Colonoscopy 2021 Mammogram 2022 - see AVS.   Tetanus 2014 PNA d/w pt.   Covid vaccine prev done.  Flu 2023 Shingrix d/w pt.   DXA d/w pt.  Ordered.  D/w pt about calling to schedule.   Advance directive  D/w pt.  Laurette Schimke designated if patient were incapacitated  PMH and SH reviewed  Meds, vitals, and allergies reviewed.   ROS: Per HPI unless specifically indicated in ROS section   GEN: nad, alert and oriented HEENT: mucous membranes moist NECK: supple w/o LA CV: rrr. PULM: ctab, no inc wob ABD: soft, +bs EXT: no edema SKIN: no acute rash  30 minutes were devoted to patient care in this encounter (this includes time spent reviewing the patient's file/history, interviewing and examining the patient, counseling/reviewing plan with patient).

## 2023-03-23 NOTE — Patient Instructions (Addendum)
Ask the front about the mobile mammogram clinic here.    Check with your insurance to see if they will cover the shingles shot.  You can call for a bone density at [facility] at [facility].  [address] [place] 336 [id number]  I would get a flu shot each fall.   Take care.  Glad to see you.

## 2023-03-26 NOTE — Assessment & Plan Note (Signed)
Continue as needed Ambien use.

## 2023-03-26 NOTE — Assessment & Plan Note (Signed)
Advance directive  D/w pt.  Timothy Goins designated if patient were incapacitated.  

## 2023-03-26 NOTE — Assessment & Plan Note (Signed)
Colonoscopy 2021 Mammogram 2022 - see AVS.   Tetanus 2014 PNA d/w pt.   Covid vaccine prev done.  Flu 2023 Shingrix d/w pt.   DXA d/w pt.  Ordered.  D/w pt about calling to schedule.   Advance directive  D/w pt.  Laurette Schimke designated if patient were incapacitated

## 2023-03-26 NOTE — Assessment & Plan Note (Signed)
Not having chest pain with exertion.  Continue isosorbide, metoprolol, will start Repatha soon.

## 2023-03-26 NOTE — Assessment & Plan Note (Signed)
Labs discussed with patient.  Would start Repatha as planned.  She will update me as needed.

## 2023-03-26 NOTE — Assessment & Plan Note (Signed)
She is trying to get her guardianship arrangements set up, she is still caring for her brother.  Her sig other is chronically ill at baseline. She is managing as is.  Intolerant of mult meds. Using xanax prn.   Sedation caution discussed with patient.  Would continue as is.

## 2023-03-27 ENCOUNTER — Telehealth: Payer: Self-pay | Admitting: Family Medicine

## 2023-03-27 NOTE — Telephone Encounter (Signed)
I called patient about PNA 20 vaccine.  We didn't get this done at the OV, given the other issues we discussed.  I apologized to patient.  She can get this done at OV in the future- she was okay with that.  I thanked her for taking the call.

## 2023-04-04 ENCOUNTER — Other Ambulatory Visit (HOSPITAL_COMMUNITY): Payer: Self-pay

## 2023-04-04 ENCOUNTER — Telehealth: Payer: Self-pay

## 2023-04-04 NOTE — Telephone Encounter (Signed)
Pharmacy Patient Advocate Encounter   Received notification from [facility] that prior authorization for REPATHA 140MG /ML is required/requested.   PA submitted on 5.14.24 to (ins) [employer] via CoverMyMeds Key or (Medicaid) confirmation # [id number]  Status is pending

## 2023-04-04 NOTE — Telephone Encounter (Signed)
PA has been approved through 11/21/2023  Approval letter has been attached in patients media

## 2023-04-09 IMAGING — US US ABDOMEN LIMITED
1 series · 14 of 23 positions shown · non-contrast
Comparison: None Available.

CLINICAL DATA: Elevated LFTs

EXAM:
ULTRASOUND ABDOMEN LIMITED RIGHT UPPER QUADRANT

[Series 1: us abdomen limited ruq (liver/gb) · 14 of 23 slices shown]
[im 1/23]
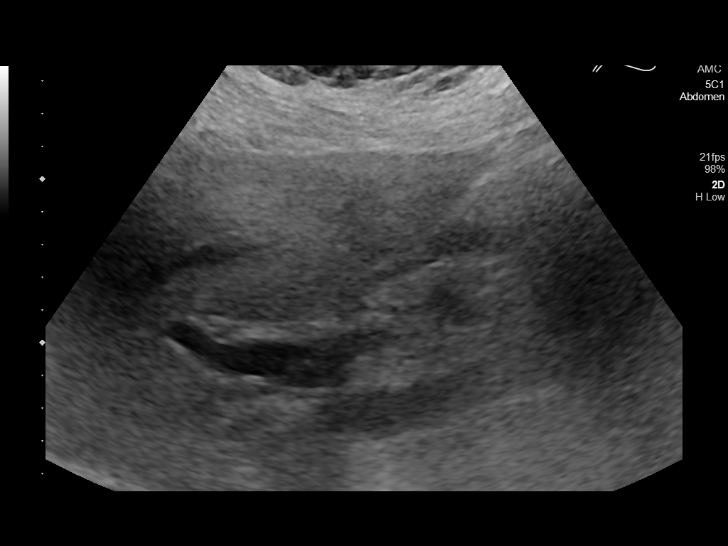
[im 3/23]
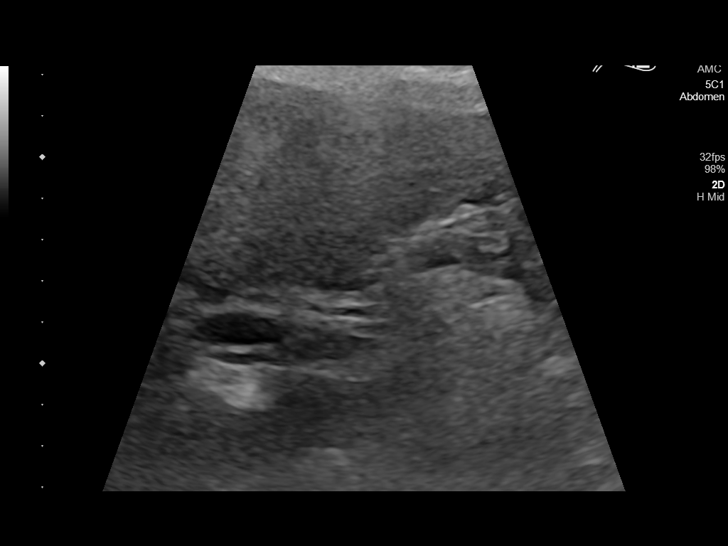
[im 5/23]
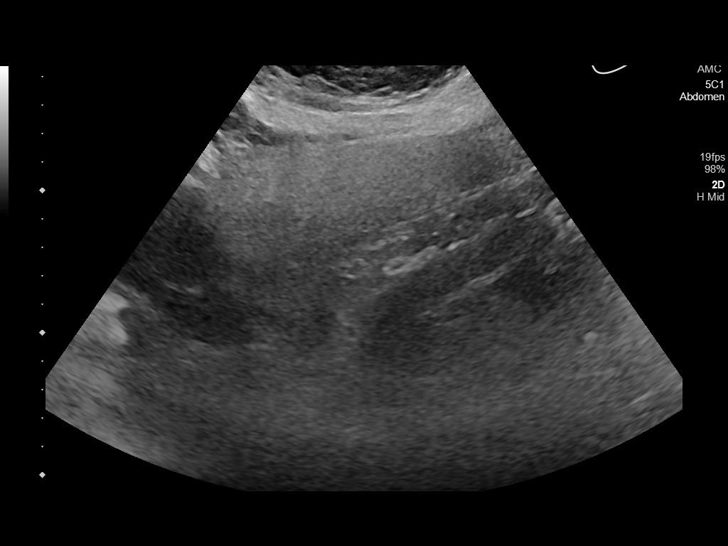
[im 6/23]
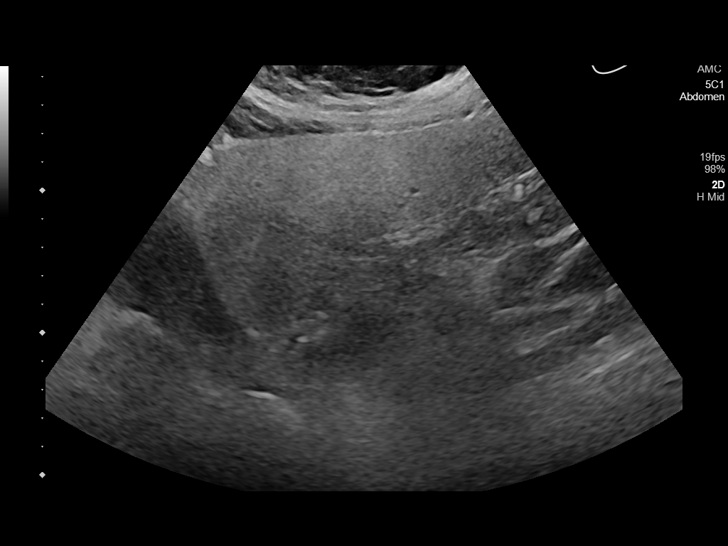
[im 8/23]
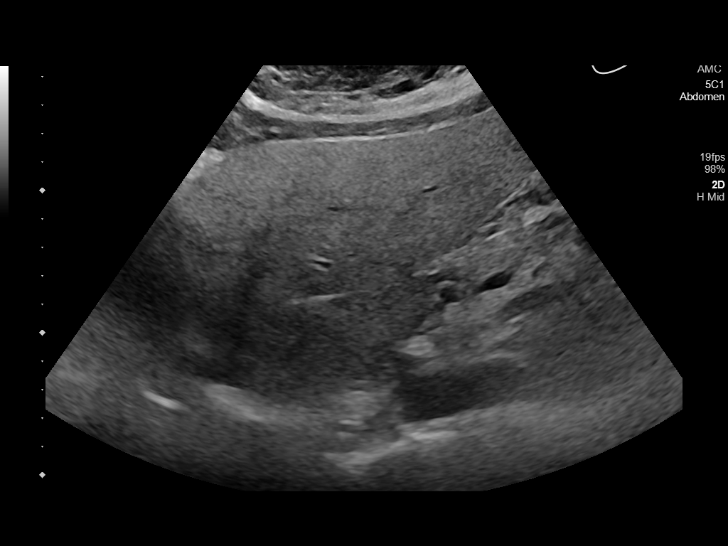
[im 10/23]
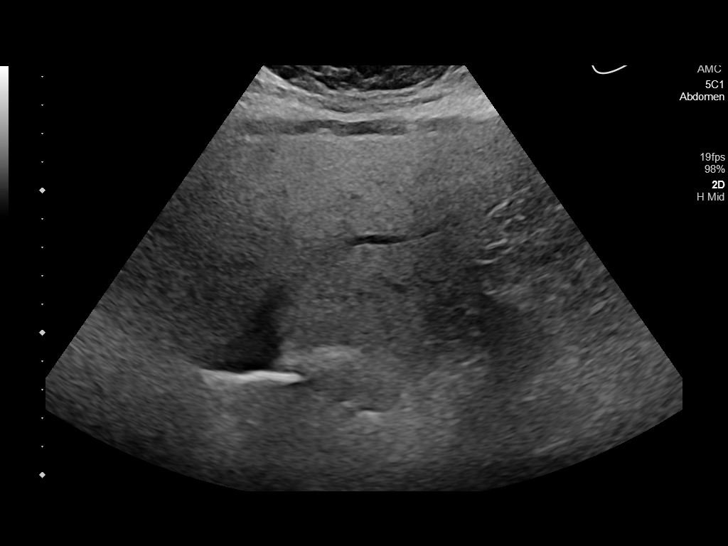
[im 11/23]
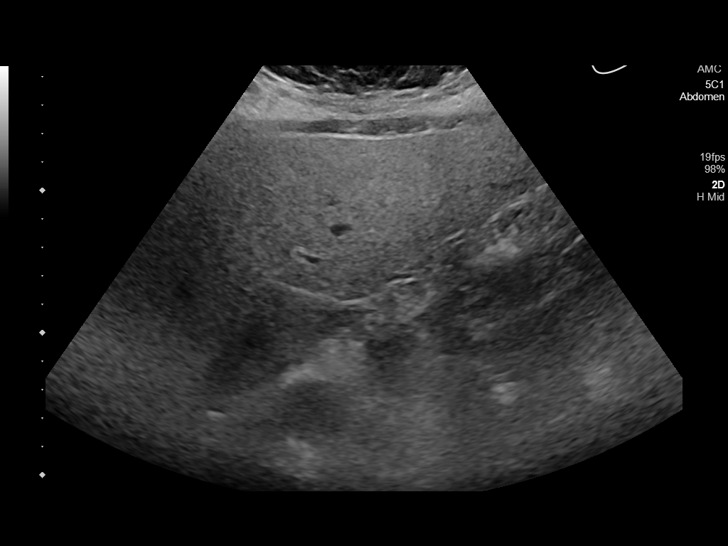
[im 13/23]
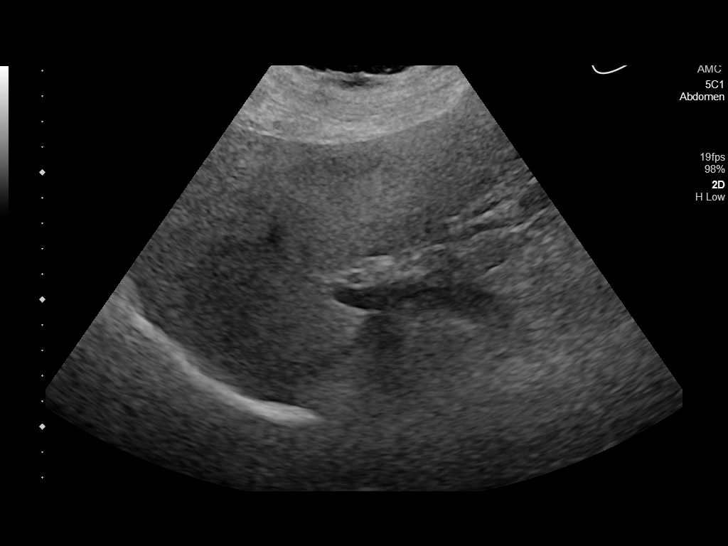
[im 14/23]
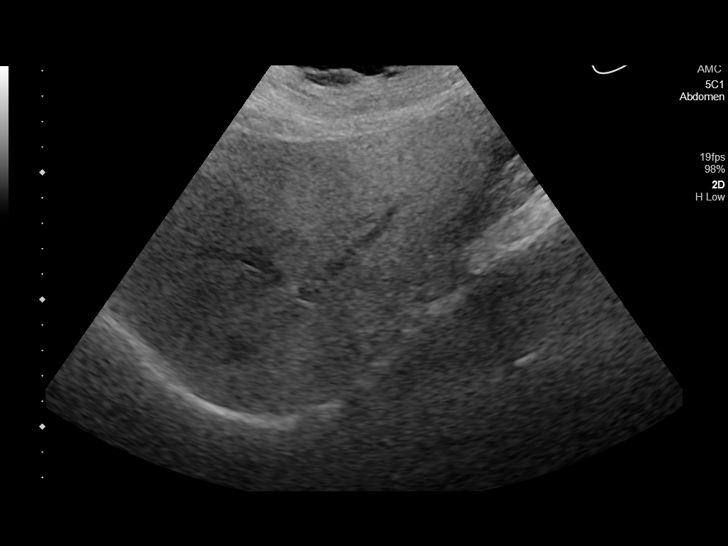
[im 16/23]
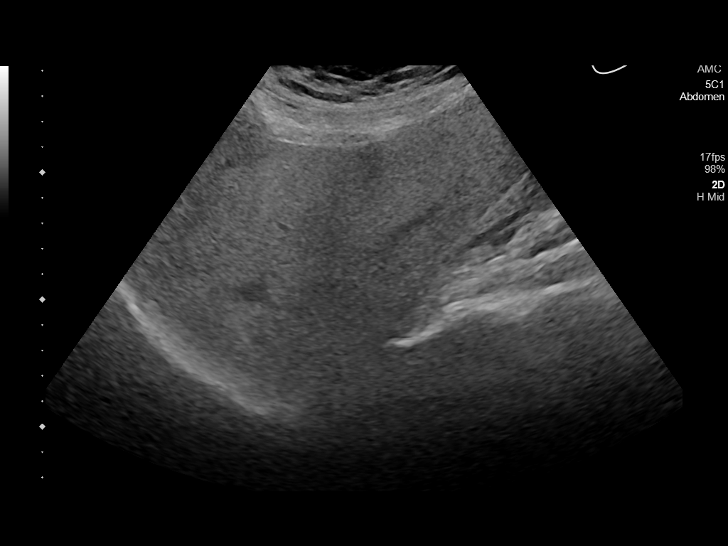
[im 18/23]
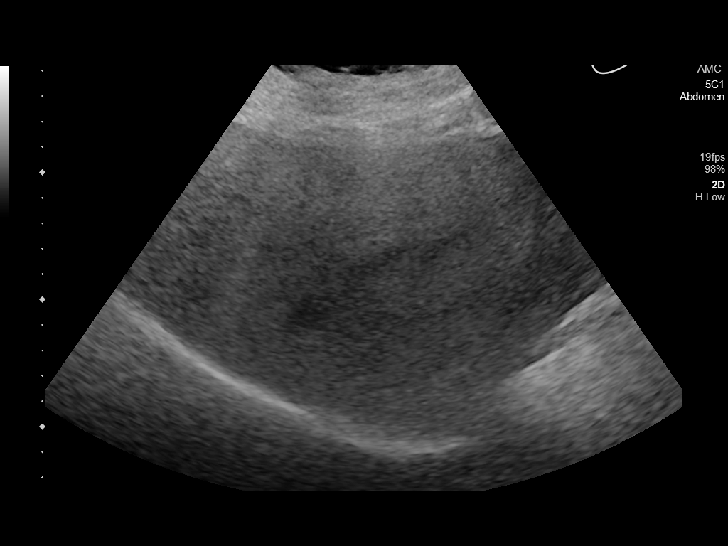
[im 19/23]
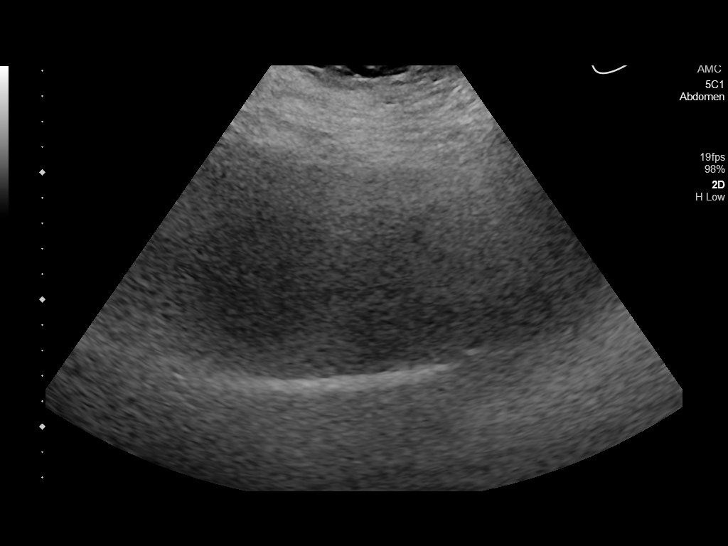
[im 21/23]
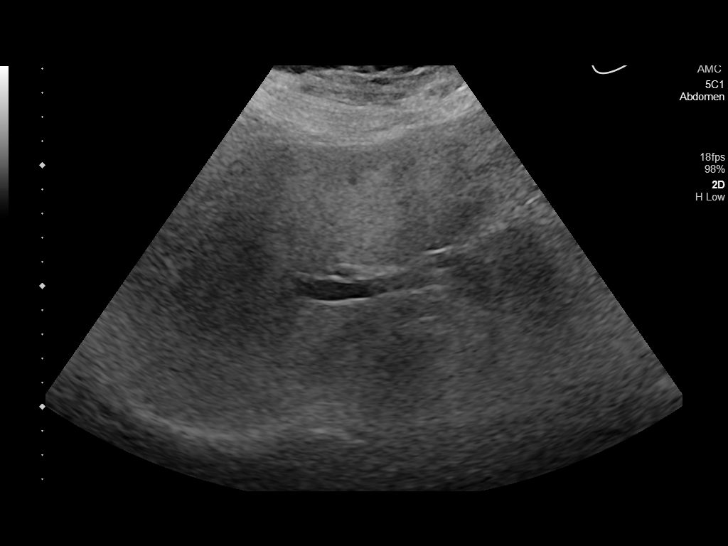
[im 23/23]
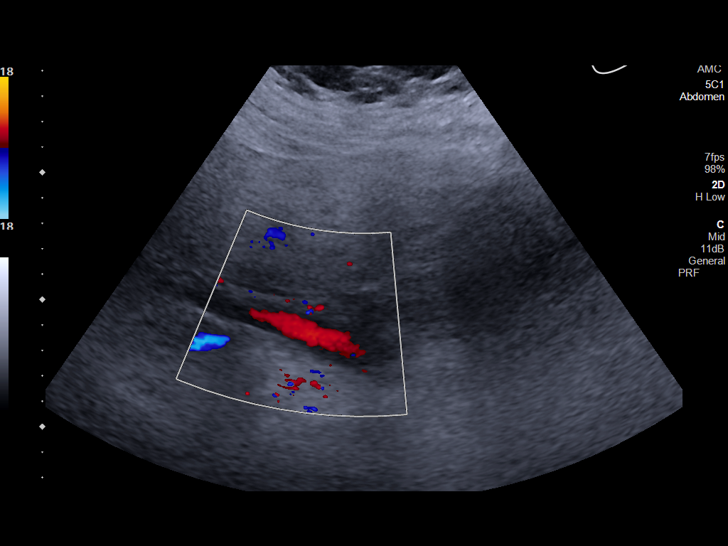

[14 of 23 positions shown; findings below may reference images not displayed]

FINDINGS: Gallbladder:

Surgically absent.

Common bile duct:

Diameter: 3 mm

Liver:

Diffuse increased echogenicity of the parenchyma with no focal mass
identified. Portal vein is patent on color Doppler imaging with
normal direction of blood flow towards the liver.

Other: None.
IMPRESSION: Increased echogenicity of the liver parenchyma which may represent
hepatic steatosis and/or other hepatocellular disease.

## 2023-04-24 ENCOUNTER — Other Ambulatory Visit: Payer: Self-pay | Admitting: Family Medicine

## 2023-06-22 ENCOUNTER — Other Ambulatory Visit: Payer: Medicare HMO

## 2023-07-10 ENCOUNTER — Other Ambulatory Visit: Payer: Self-pay | Admitting: Family Medicine

## 2023-08-11 ENCOUNTER — Other Ambulatory Visit: Payer: Self-pay | Admitting: Family Medicine

## 2023-08-11 NOTE — Telephone Encounter (Signed)
LAST APPOINTMENT DATE: 03/23/23  NEXT APPOINTMENT DATE: Visit date not found    LAST REFILL: 03/05/23  QTY: #90 2 rf

## 2023-09-11 ENCOUNTER — Ambulatory Visit (INDEPENDENT_AMBULATORY_CARE_PROVIDER_SITE_OTHER): Payer: Medicare HMO

## 2023-09-11 DIAGNOSIS — Z23 Encounter for immunization: Secondary | ICD-10-CM

## 2023-09-19 ENCOUNTER — Other Ambulatory Visit: Payer: Self-pay | Admitting: Family Medicine

## 2023-09-20 NOTE — Telephone Encounter (Signed)
LOV:03/23/2023 NV: nothing scheduled LAST REFILL: 03/22/2023 - Zolpidem Tartrate Oral Tablet 10 MG,  10/24/2022 - Cyclobenzaprine HCl Oral Tablet 10 MG

## 2023-09-20 NOTE — Telephone Encounter (Signed)
Sent. Thanks.   

## 2023-11-11 ENCOUNTER — Other Ambulatory Visit: Payer: Self-pay | Admitting: Physician Assistant

## 2023-11-11 ENCOUNTER — Other Ambulatory Visit: Payer: Self-pay | Admitting: Family Medicine

## 2023-11-28 ENCOUNTER — Other Ambulatory Visit: Payer: Self-pay

## 2023-11-28 ENCOUNTER — Emergency Department (HOSPITAL_COMMUNITY): Payer: Self-pay

## 2023-11-28 ENCOUNTER — Emergency Department (HOSPITAL_COMMUNITY): Payer: No Typology Code available for payment source

## 2023-11-28 ENCOUNTER — Observation Stay (HOSPITAL_COMMUNITY)
Admission: EM | Admit: 2023-11-28 | Discharge: 2023-11-29 | Disposition: A | Discharge status: Home / Self Care | Payer: No Typology Code available for payment source | Attending: Internal Medicine | Admitting: Internal Medicine

## 2023-11-28 DIAGNOSIS — R930 Abnormal findings on diagnostic imaging of skull and head, not elsewhere classified: Secondary | ICD-10-CM | POA: Diagnosis present

## 2023-11-28 DIAGNOSIS — E872 Acidosis, unspecified: Secondary | ICD-10-CM | POA: Diagnosis not present

## 2023-11-28 DIAGNOSIS — I2510 Atherosclerotic heart disease of native coronary artery without angina pectoris: Secondary | ICD-10-CM | POA: Insufficient documentation

## 2023-11-28 DIAGNOSIS — R0989 Other specified symptoms and signs involving the circulatory and respiratory systems: Secondary | ICD-10-CM | POA: Diagnosis present

## 2023-11-28 DIAGNOSIS — R55 Syncope and collapse: Secondary | ICD-10-CM | POA: Diagnosis not present

## 2023-11-28 DIAGNOSIS — J9601 Acute respiratory failure with hypoxia: Secondary | ICD-10-CM | POA: Diagnosis not present

## 2023-11-28 DIAGNOSIS — T17920A Food in respiratory tract, part unspecified causing asphyxiation, initial encounter: Secondary | ICD-10-CM | POA: Diagnosis not present

## 2023-11-28 DIAGNOSIS — R29818 Other symptoms and signs involving the nervous system: Secondary | ICD-10-CM | POA: Diagnosis not present

## 2023-11-28 DIAGNOSIS — I6523 Occlusion and stenosis of bilateral carotid arteries: Secondary | ICD-10-CM | POA: Diagnosis not present

## 2023-11-28 DIAGNOSIS — Z7982 Long term (current) use of aspirin: Secondary | ICD-10-CM | POA: Insufficient documentation

## 2023-11-28 DIAGNOSIS — H0520 Unspecified exophthalmos: Secondary | ICD-10-CM | POA: Insufficient documentation

## 2023-11-28 DIAGNOSIS — W44F3XA Food entering into or through a natural orifice, initial encounter: Secondary | ICD-10-CM

## 2023-11-28 DIAGNOSIS — Z87891 Personal history of nicotine dependence: Secondary | ICD-10-CM | POA: Insufficient documentation

## 2023-11-28 DIAGNOSIS — R7401 Elevation of levels of liver transaminase levels: Secondary | ICD-10-CM | POA: Diagnosis not present

## 2023-11-28 DIAGNOSIS — J690 Pneumonitis due to inhalation of food and vomit: Secondary | ICD-10-CM | POA: Diagnosis not present

## 2023-11-28 DIAGNOSIS — I119 Hypertensive heart disease without heart failure: Secondary | ICD-10-CM | POA: Diagnosis not present

## 2023-11-28 DIAGNOSIS — T17308A Unspecified foreign body in larynx causing other injury, initial encounter: Secondary | ICD-10-CM | POA: Diagnosis present

## 2023-11-28 DIAGNOSIS — I639 Cerebral infarction, unspecified: Secondary | ICD-10-CM

## 2023-11-28 DIAGNOSIS — I469 Cardiac arrest, cause unspecified: Secondary | ICD-10-CM | POA: Diagnosis not present

## 2023-11-28 DIAGNOSIS — E871 Hypo-osmolality and hyponatremia: Secondary | ICD-10-CM | POA: Insufficient documentation

## 2023-11-28 DIAGNOSIS — R061 Stridor: Secondary | ICD-10-CM | POA: Diagnosis not present

## 2023-11-28 DIAGNOSIS — R2681 Unsteadiness on feet: Secondary | ICD-10-CM | POA: Diagnosis not present

## 2023-11-28 DIAGNOSIS — Z48812 Encounter for surgical aftercare following surgery on the circulatory system: Secondary | ICD-10-CM | POA: Diagnosis not present

## 2023-11-28 DIAGNOSIS — Z6841 Body Mass Index (BMI) 40.0 and over, adult: Secondary | ICD-10-CM | POA: Insufficient documentation

## 2023-11-28 DIAGNOSIS — R404 Transient alteration of awareness: Secondary | ICD-10-CM | POA: Diagnosis not present

## 2023-11-28 DIAGNOSIS — I4510 Unspecified right bundle-branch block: Secondary | ICD-10-CM | POA: Diagnosis not present

## 2023-11-28 DIAGNOSIS — Z79899 Other long term (current) drug therapy: Secondary | ICD-10-CM | POA: Diagnosis not present

## 2023-11-28 DIAGNOSIS — D649 Anemia, unspecified: Secondary | ICD-10-CM | POA: Diagnosis not present

## 2023-11-28 DIAGNOSIS — T17908A Unspecified foreign body in respiratory tract, part unspecified causing other injury, initial encounter: Secondary | ICD-10-CM | POA: Diagnosis present

## 2023-11-28 LAB — POCT I-STAT EG7
Acid-base deficit: 7 mmol/L — ABNORMAL HIGH (ref 0.0–2.0)
Bicarbonate: 19.1 mmol/L — ABNORMAL LOW (ref 20.0–28.0)
Calcium, Ion: 1.03 mmol/L — ABNORMAL LOW (ref 1.15–1.40)
HCT: 39 % (ref 36.0–46.0)
Hemoglobin: 13.3 g/dL (ref 12.0–15.0)
O2 Saturation: 92 %
Potassium: 3.5 mmol/L (ref 3.5–5.1)
Sodium: 130 mmol/L — ABNORMAL LOW (ref 135–145)
TCO2: 20 mmol/L — ABNORMAL LOW (ref 22–32)
pCO2, Ven: 38.4 mm[Hg] — ABNORMAL LOW (ref 44–60)
pH, Ven: 7.303 (ref 7.25–7.43)
pO2, Ven: 70 mm[Hg] — ABNORMAL HIGH (ref 32–45)

## 2023-11-28 LAB — CBC
HCT: 39 % (ref 36.0–46.0)
Hemoglobin: 12.9 g/dL (ref 12.0–15.0)
MCH: 31.1 pg (ref 26.0–34.0)
MCHC: 33.1 g/dL (ref 30.0–36.0)
MCV: 94 fL (ref 80.0–100.0)
Platelets: 183 10*3/uL (ref 150–400)
RBC: 4.15 MIL/uL (ref 3.87–5.11)
RDW: 13.1 % (ref 11.5–15.5)
WBC: 7 10*3/uL (ref 4.0–10.5)
nRBC: 0 % (ref 0.0–0.2)

## 2023-11-28 LAB — COMPREHENSIVE METABOLIC PANEL
ALT: 48 U/L — ABNORMAL HIGH (ref 0–44)
AST: 75 U/L — ABNORMAL HIGH (ref 15–41)
Albumin: 3.5 g/dL (ref 3.5–5.0)
Alkaline Phosphatase: 135 U/L — ABNORMAL HIGH (ref 38–126)
Anion gap: 15 (ref 5–15)
BUN: 9 mg/dL (ref 8–23)
CO2: 18 mmol/L — ABNORMAL LOW (ref 22–32)
Calcium: 8.5 mg/dL — ABNORMAL LOW (ref 8.9–10.3)
Chloride: 95 mmol/L — ABNORMAL LOW (ref 98–111)
Creatinine, Ser: 0.81 mg/dL (ref 0.44–1.00)
GFR, Estimated: >60 mL/min (ref >60)
Glucose, Bld: 186 mg/dL — ABNORMAL HIGH (ref 70–99)
Potassium: 3.5 mmol/L (ref 3.5–5.1)
Sodium: 128 mmol/L — ABNORMAL LOW (ref 135–145)
Total Bilirubin: 1.4 mg/dL — ABNORMAL HIGH (ref 0.0–1.2)
Total Protein: 7.5 g/dL (ref 6.5–8.1)

## 2023-11-28 LAB — TROPONIN I (HIGH SENSITIVITY): Troponin I (High Sensitivity): 15 ng/L (ref <18)

## 2023-11-28 MED ORDER — SODIUM CHLORIDE 0.9 % IV SOLN
3.0000 g | Freq: Once | INTRAVENOUS | Status: AC
  Administered 2023-11-28: 3 g via INTRAVENOUS

## 2023-11-28 MED ORDER — LORAZEPAM 1 MG PO TABS
0.5000 mg | ORAL_TABLET | ORAL | Status: AC | PRN
  Administered 2023-11-28: 0.5 mg via ORAL
  Filled 2023-11-28: qty 1

## 2023-11-28 NOTE — ED Notes (Signed)
 Family at bedside. Dr. Karene Fry informed.

## 2023-11-28 NOTE — ED Triage Notes (Signed)
 PER EMS: pt is from home, husband called EMS because he saw her choking on a sandwich, attempted heimlich and then she went unresponsive. Husband began doing CPR until fire dept arrived who took over. EMS arrived and she had ROSC. EMS states they could see some food in her airway. They were able to get some of it up. NPA inserted.  No meds given, no shocks. Pt arrived to ER with 15L NRB, responsive to verbal stimuli.    BP- 168/88, HR-90, 97% NPA 15L [place]

## 2023-11-28 NOTE — ED Notes (Signed)
 X-ray at bedside

## 2023-11-28 NOTE — ED Provider Notes (Signed)
 [facility] [facility] AT [facility] Provider Note   CSN: [id number] Arrival date & time: 11/28/23  2219     History  Chief Complaint  Patient presents with   Choking    Andrea Webster is a 68 y.o. female.  HPI   68 year old female with medical history significant for CAD, GERD, anxiety, HLD, obesity presenting to the emergency department after a choking episode and subsequent cardiac arrest.  The patient reportedly choked on a [employer] sandwich and the husband attempted to remove the material and also provided support via the Heimlich maneuver.  Fire found the patient to be in cardiac arrest and and subsequently administered chest compressions for 5 minutes in total.  When EMS arrived on scene, the patient was found to have ROSC.  She remained in respiratory distress but subsequently her gag and cough reflex was triggered and she was able to cough up additional material.  Concern for aspiration in the setting of her presentation.  The patient arrived with nasal cannula in place, nasal trumpet in place, saturating well on a nonrebreather, subsequently de-escalated to 4 L O2 via nasal cannula.  She remained mildly somnolent on arrival, GCS 14.  Home Medications Prior to Admission medications   Not on File      Allergies    Patient has no allergy information on record.    Review of Systems   Review of Systems  Unable to perform ROS: Mental status change    Physical Exam Updated Vital Signs BP 112/62 (BP Location: Left Arm)   Pulse 87   Temp (!) 97 F (36.1 C) (Temporal)   Resp (!) 38   SpO2 98%  Physical Exam Vitals and nursing note reviewed.  Constitutional:      General: She is not in acute distress.    Appearance: She is obese.     Comments: GCS 14, protecting airway  HENT:     Head: Normocephalic and atraumatic.     Comments: Right-sided nasal trumpet in place Eyes:     Conjunctiva/sclera: Conjunctivae normal.     Pupils: Pupils are equal, round,  and reactive to light.  Cardiovascular:     Rate and Rhythm: Normal rate and regular rhythm.  Pulmonary:     Effort: Tachypnea present. No respiratory distress.     Breath sounds: Rhonchi present.     Comments: Saturating well on 4 L O2 via nasal Cannula, tachypneic but in no immediate distress Abdominal:     General: There is no distension.     Tenderness: There is no guarding.  Musculoskeletal:        General: No deformity or signs of injury.     Cervical back: Neck supple.  Skin:    Findings: No lesion or rash.  Neurological:     General: No focal deficit present.     Mental Status: She is alert. Mental status is at baseline.     ED Results / Procedures / Treatments   Labs (all labs ordered are listed, but only abnormal results are displayed) Labs Reviewed  COMPREHENSIVE METABOLIC PANEL - Abnormal; Notable for the following components:      Result Value   Sodium 128 (*)    Chloride 95 (*)    CO2 18 (*)    Glucose, Bld 186 (*)    Calcium  8.5 (*)    AST 75 (*)    ALT 48 (*)    Alkaline Phosphatase 135 (*)    Total Bilirubin 1.4 (*)  All other components within normal limits  POCT I-STAT EG7 - Abnormal; Notable for the following components:   pCO2, Ven 38.4 (*)    pO2, Ven 70 (*)    Bicarbonate 19.1 (*)    TCO2 20 (*)    Acid-base deficit 7.0 (*)    Sodium 130 (*)    Calcium , Ion 1.03 (*)    All other components within normal limits  CBC  CBG MONITORING, ED  I-STAT VENOUS BLOOD GAS, ED  TROPONIN I (HIGH SENSITIVITY)    EKG None  Radiology CT Head Wo Contrast Result Date: 11/28/2023 CLINICAL DATA:  Neuro deficit, acute, stroke suspected. Choking then became unresponsive. EXAM: CT HEAD WITHOUT CONTRAST TECHNIQUE: Contiguous axial images were obtained from the base of the skull through the vertex without intravenous contrast. RADIATION DOSE REDUCTION: This exam was performed according to the departmental dose-optimization program which includes automated  exposure control, adjustment of the mA and/or kV according to patient size and/or use of iterative reconstruction technique. COMPARISON:  None Available. FINDINGS: Brain: There is a hypodense nonhemorrhagic infarct band in the right frontal deep white matter, extending from the superior body of the right lateral ventricle to the superior insular cortex and measuring 2.1 x 2.1 x 1.3 cm. There is a slight volume loss associated with this. This is most likely either a chronic infarct or a late-subacute infarct in the late stages of evolution. Elsewhere, there is mild cerebral atrophy and small-vessel disease, unremarkable cerebellum and brainstem and no other appreciable infarct. There is no hemorrhage, mass effect or midline shift. The ventricles are normal in size and position. Vascular: Both carotid siphons are heavily calcified. No hyperdense central vessel is seen. Skull: Negative for fracture or focal lesions. Sinuses/Orbits: Left mastoid air cells are clear. There is patchy fluid in the lower right mastoid air cells. Clear paranasal sinuses. Right-sided nasoenteric intubation partially visible. Bilateral proptosis. No extraocular muscle thickening. Symmetric increased intraorbital fat which may be seen with obesity and chronic use of corticosteroids. Other: None. IMPRESSION: 1. 2.1 cm hypodense nonhemorrhagic infarct band in the right frontal deep white matter, most likely either a chronic infarct or a late-subacute infarct in the late stages of evolution. MRI follow-up may be helpful. 2. Atrophy and small-vessel disease. 3. Carotid atherosclerosis. 4. Bilateral proptosis with increased intraorbital fat which may be seen with obesity and chronic use of corticosteroids. 5. Patchy fluid in the lower right mastoid air cells. Electronically Signed   By: Francis Quam M.D.   On: 11/28/2023 23:15   DG Chest Portable 1 View Result Date: 11/28/2023 CLINICAL DATA:  Post CPR, choked on a sandwich and went unresponsive  EXAM: PORTABLE CHEST 1 VIEW COMPARISON:  None Available. FINDINGS: Normal cardiomediastinal silhouette. No focal consolidation, pleural effusion, or pneumothorax. No displaced rib fractures. IMPRESSION: No acute cardiopulmonary disease. Electronically Signed   By: Norman Gatlin M.D.   On: 11/28/2023 22:31    Procedures Procedures    Medications Ordered in ED Medications  LORazepam  (ATIVAN ) tablet 0.5 mg (has no administration in time range)  Ampicillin -Sulbactam (UNASYN ) 3 g in sodium chloride  0.9 % 100 mL IVPB (0 g Intravenous Stopped 11/28/23 2308)    ED Course/ Medical Decision Making/ A&P                                 Medical Decision Making Amount and/or Complexity of Data Reviewed Labs: ordered. Radiology: ordered.  Risk Prescription drug  management. Decision regarding hospitalization.    68 year old female with medical history significant for CAD, GERD, anxiety, HLD, obesity presenting to the emergency department after a choking episode and subsequent cardiac arrest.  The patient reportedly choked on a [employer] sandwich and the husband attempted to remove the material and also provided support via the Heimlich maneuver.  Fire found the patient to be in cardiac arrest and and subsequently administered chest compressions for 5 minutes in total.  When EMS arrived on scene, the patient was found to have ROSC.  She remained in respiratory distress but subsequently her gag and cough reflex was triggered and she was able to cough up additional material.  Concern for aspiration in the setting of her presentation.  The patient arrived with nasal cannula in place, nasal trumpet in place, saturating well on a nonrebreather, subsequently de-escalated to 4 L O2 via nasal cannula.  She remained mildly somnolent on arrival, GCS 14.  On arrival, the patient was vitally stable, tachypneic RR 38, saturating well on 4 L O2 via nasal cannula, blood pressure 112/62, heart rate 87.  Sinus rhythm noted  on cardiac telemetry.  Physical exam revealed rhonchi bilaterally.   Initial EKG revealed sinus rhythm, ventricular rate 83, right bundle branch block, no acute ischemic changes.  A chest x-ray did not show evidence of pneumonia.  The patient was covered with IV Unasyn  due to aspiration event into the airway.  CT imaging of the head performed given the patient's persistent mild somnolence was concerning for potential subacute to chronic infarct, MRI was recommended for further evaluation.  The patient is moving all 4 extremities with no focal deficits. IMPRESSION:  1. 2.1 cm hypodense nonhemorrhagic infarct band in the right frontal  deep white matter, most likely either a chronic infarct or a  late-subacute infarct in the late stages of evolution. MRI follow-up  may be helpful.  2. Atrophy and small-vessel disease.  3. Carotid atherosclerosis.  4. Bilateral proptosis with increased intraorbital fat which may be  seen with obesity and chronic use of corticosteroids.  5. Patchy fluid in the lower right mastoid air cells.    Laboratory evaluation revealed an i-STAT VBG with a pH of 7.30, pCO2 of 38, bicarbonate of 19, CBC without a leukocytosis or anemia, initial cardiac troponin 15, CMP with hyponatremia to 128, hyperglycemia to 186, mild non-anion gap acidosis with a bicarbonate of 18, anion gap of 15, mildly elevated LFTs with an AST of 75, ALT of 48.  In the setting of the patient's cardiac arrest and aspiration/choking event, medicine was consulted for admission for observation in the progressive status.  An MRI brain was ordered to further evaluate given the CT findings.  Family were updated regarding the plan of care at bedside.  Patient alert to verbal stimuli, protecting her airway at time of admission, hemodynamically stable.    Final Clinical Impression(s) / ED Diagnoses Final diagnoses:  Choking due to food in larynx, initial encounter  Respiratory arrest before cardiac arrest  (HCC)  Acute respiratory failure with hypoxia (HCC)  Aspiration by newborn with respiratory symptoms, unspecified aspirated material  Cerebrovascular accident (CVA), unspecified mechanism (HCC)    Rx / DC Orders ED Discharge Orders     None         Jerrol Agent, MD 11/28/23 2329

## 2023-11-29 ENCOUNTER — Emergency Department (HOSPITAL_COMMUNITY): Payer: No Typology Code available for payment source

## 2023-11-29 ENCOUNTER — Observation Stay (HOSPITAL_BASED_OUTPATIENT_CLINIC_OR_DEPARTMENT_OTHER): Payer: No Typology Code available for payment source

## 2023-11-29 ENCOUNTER — Telehealth: Payer: Self-pay | Admitting: Internal Medicine

## 2023-11-29 DIAGNOSIS — I469 Cardiac arrest, cause unspecified: Secondary | ICD-10-CM | POA: Diagnosis not present

## 2023-11-29 DIAGNOSIS — W44F3XA Food entering into or through a natural orifice, initial encounter: Secondary | ICD-10-CM

## 2023-11-29 DIAGNOSIS — J9601 Acute respiratory failure with hypoxia: Secondary | ICD-10-CM | POA: Diagnosis not present

## 2023-11-29 DIAGNOSIS — R930 Abnormal findings on diagnostic imaging of skull and head, not elsewhere classified: Secondary | ICD-10-CM | POA: Diagnosis present

## 2023-11-29 DIAGNOSIS — T17908A Unspecified foreign body in respiratory tract, part unspecified causing other injury, initial encounter: Secondary | ICD-10-CM | POA: Diagnosis present

## 2023-11-29 DIAGNOSIS — T17308A Unspecified foreign body in larynx causing other injury, initial encounter: Secondary | ICD-10-CM | POA: Diagnosis present

## 2023-11-29 DIAGNOSIS — G319 Degenerative disease of nervous system, unspecified: Secondary | ICD-10-CM | POA: Diagnosis not present

## 2023-11-29 DIAGNOSIS — I6782 Cerebral ischemia: Secondary | ICD-10-CM | POA: Diagnosis not present

## 2023-11-29 DIAGNOSIS — I6381 Other cerebral infarction due to occlusion or stenosis of small artery: Secondary | ICD-10-CM | POA: Diagnosis not present

## 2023-11-29 DIAGNOSIS — T17320A Food in larynx causing asphyxiation, initial encounter: Secondary | ICD-10-CM

## 2023-11-29 LAB — COMPREHENSIVE METABOLIC PANEL
ALT: 44 U/L (ref 0–44)
AST: 58 U/L — ABNORMAL HIGH (ref 15–41)
Albumin: 3.2 g/dL — ABNORMAL LOW (ref 3.5–5.0)
Alkaline Phosphatase: 112 U/L (ref 38–126)
Anion gap: 15 (ref 5–15)
BUN: 9 mg/dL (ref 8–23)
CO2: 17 mmol/L — ABNORMAL LOW (ref 22–32)
Calcium: 8.2 mg/dL — ABNORMAL LOW (ref 8.9–10.3)
Chloride: 96 mmol/L — ABNORMAL LOW (ref 98–111)
Creatinine, Ser: 0.76 mg/dL (ref 0.44–1.00)
GFR, Estimated: >60 mL/min (ref >60)
Glucose, Bld: 134 mg/dL — ABNORMAL HIGH (ref 70–99)
Potassium: 3.8 mmol/L (ref 3.5–5.1)
Sodium: 128 mmol/L — ABNORMAL LOW (ref 135–145)
Total Bilirubin: 1.1 mg/dL (ref 0.0–1.2)
Total Protein: 6.6 g/dL (ref 6.5–8.1)

## 2023-11-29 LAB — CBC
HCT: 34.1 % — ABNORMAL LOW (ref 36.0–46.0)
Hemoglobin: 11.5 g/dL — ABNORMAL LOW (ref 12.0–15.0)
MCH: 31.1 pg (ref 26.0–34.0)
MCHC: 33.7 g/dL (ref 30.0–36.0)
MCV: 92.2 fL (ref 80.0–100.0)
Platelets: 161 10*3/uL (ref 150–400)
RBC: 3.7 MIL/uL — ABNORMAL LOW (ref 3.87–5.11)
RDW: 13 % (ref 11.5–15.5)
WBC: 7.8 10*3/uL (ref 4.0–10.5)
nRBC: 0 % (ref 0.0–0.2)

## 2023-11-29 LAB — ECHOCARDIOGRAM COMPLETE
Height: 60 in
S' Lateral: 2.6 cm
Weight: 3920 [oz_av]

## 2023-11-29 LAB — HIV ANTIBODY (ROUTINE TESTING W REFLEX): HIV Screen 4th Generation wRfx: NONREACTIVE

## 2023-11-29 LAB — TROPONIN I (HIGH SENSITIVITY)
Troponin I (High Sensitivity): 111 ng/L (ref <18)
Troponin I (High Sensitivity): 74 ng/L — ABNORMAL HIGH (ref <18)
Troponin I (High Sensitivity): 75 ng/L — ABNORMAL HIGH (ref <18)

## 2023-11-29 LAB — CBG MONITORING, ED: Glucose-Capillary: 190 mg/dL — ABNORMAL HIGH (ref 70–99)

## 2023-11-29 MED ORDER — ACETAMINOPHEN 325 MG PO TABS: 650.0000 mg | ORAL_TABLET | Freq: Four times a day (QID) | ORAL | Status: DC | PRN

## 2023-11-29 MED ORDER — LORATADINE 10 MG PO TABS
10.0000 mg | ORAL_TABLET | Freq: Every day | ORAL | Status: DC
  Administered 2023-11-29: 10 mg via ORAL
  Filled 2023-11-29: qty 1

## 2023-11-29 MED ORDER — PANTOPRAZOLE SODIUM 40 MG PO TBEC
40.0000 mg | DELAYED_RELEASE_TABLET | Freq: Every day | ORAL | Status: DC
  Administered 2023-11-29: 40 mg via ORAL
  Filled 2023-11-29: qty 1

## 2023-11-29 MED ORDER — ISOSORBIDE MONONITRATE ER 30 MG PO TB24
60.0000 mg | ORAL_TABLET | Freq: Every day | ORAL | Status: DC
  Administered 2023-11-29: 60 mg via ORAL
  Filled 2023-11-29: qty 2

## 2023-11-29 MED ORDER — SODIUM CHLORIDE 0.9 % IV SOLN
1.5000 g | Freq: Four times a day (QID) | INTRAVENOUS | Status: DC
  Administered 2023-11-29: 1.5 g via INTRAVENOUS
  Filled 2023-11-29 (×4): qty 4

## 2023-11-29 MED ORDER — ONDANSETRON HCL 4 MG/2ML IJ SOLN: 4.0000 mg | Freq: Four times a day (QID) | INTRAMUSCULAR | Status: DC | PRN

## 2023-11-29 MED ORDER — ACETAMINOPHEN 650 MG RE SUPP: 650.0000 mg | Freq: Four times a day (QID) | RECTAL | Status: DC | PRN

## 2023-11-29 MED ORDER — PERFLUTREN LIPID MICROSPHERE
1.0000 mL | INTRAVENOUS | Status: AC | PRN
  Administered 2023-11-29: 5 mL via INTRAVENOUS

## 2023-11-29 MED ORDER — SODIUM CHLORIDE 0.9 % IV SOLN
1.5000 g | Freq: Four times a day (QID) | INTRAVENOUS | Status: DC
  Administered 2023-11-29: 1.5 g via INTRAVENOUS
  Filled 2023-11-29 (×3): qty 4

## 2023-11-29 MED ORDER — ONDANSETRON HCL 4 MG PO TABS: 4.0000 mg | ORAL_TABLET | Freq: Four times a day (QID) | ORAL | Status: DC | PRN

## 2023-11-29 MED ORDER — SODIUM BICARBONATE 650 MG PO TABS: 650.0000 mg | ORAL_TABLET | Freq: Two times a day (BID) | ORAL | 0 refills | Status: AC

## 2023-11-29 MED ORDER — ZOLPIDEM TARTRATE 5 MG PO TABS: 5.0000 mg | ORAL_TABLET | Freq: Every day | ORAL | Status: DC

## 2023-11-29 MED ORDER — ASPIRIN 81 MG PO TBEC
81.0000 mg | DELAYED_RELEASE_TABLET | Freq: Every day | ORAL | Status: DC
  Administered 2023-11-29: 81 mg via ORAL
  Filled 2023-11-29: qty 1

## 2023-11-29 MED ORDER — ADULT MULTIVITAMIN W/MINERALS CH
1.0000 | ORAL_TABLET | Freq: Every day | ORAL | Status: DC
  Administered 2023-11-29: 1 via ORAL
  Filled 2023-11-29: qty 1

## 2023-11-29 MED ORDER — ALPRAZOLAM 0.25 MG PO TABS
1.0000 mg | ORAL_TABLET | Freq: Three times a day (TID) | ORAL | Status: DC | PRN
  Administered 2023-11-29: 1 mg via ORAL
  Filled 2023-11-29: qty 4

## 2023-11-29 MED ORDER — FLUTICASONE PROPIONATE 50 MCG/ACT NA SUSP
1.0000 | Freq: Every day | NASAL | Status: DC
  Filled 2023-11-29: qty 16

## 2023-11-29 MED ORDER — ENOXAPARIN SODIUM 40 MG/0.4ML IJ SOSY
40.0000 mg | PREFILLED_SYRINGE | INTRAMUSCULAR | Status: DC
  Administered 2023-11-29: 40 mg via SUBCUTANEOUS
  Filled 2023-11-29: qty 0.4

## 2023-11-29 MED ORDER — METOPROLOL TARTRATE 25 MG PO TABS
25.0000 mg | ORAL_TABLET | Freq: Two times a day (BID) | ORAL | Status: DC
  Administered 2023-11-29: 25 mg via ORAL
  Filled 2023-11-29: qty 1

## 2023-11-29 NOTE — ED Notes (Signed)
 [phone] Andrea Webster pt daughter called for update

## 2023-11-29 NOTE — Evaluation (Signed)
 Physical Therapy Evaluation and Discharge Patient Details Name: Andrea Webster MRN: [id number] DOB: Feb 25, 1956 Today's Date: 11/29/2023  History of Present Illness  Andrea Webster is a 68 y.o. female who presented following a choking epsiode during which she became unresponsive and required CPR. CXR and Head CT 1/8 without acute findings.  [employer], but kept NPO prior to this eval d/t circumstances of admission.  Pt with medical history significant of CAD, HTN, HLD.  Clinical Impression  Patient evaluated by Physical Therapy with no further acute PT needs identified. PTA, pt lives with her brother and spouse and is independent. Pt presents with mildly decreased cardiopulmonary endurance and dynamic balance deficits, which are likely baseline. Pt ambulating household distances with no assistive device without physical assist. SpO2 95% on RA, HR 88. Education provided regarding AHA recommendations for physical activity and use of cane as needed for unlevel surface negotiation. All education has been completed and the patient has no further questions. No follow-up Physical Therapy or equipment needs. PT is signing off. Thank you for this referral.       If plan is discharge home, recommend the following:     Can travel by private vehicle        Equipment Recommendations None recommended by PT  Recommendations for Other Services       Functional Status Assessment Patient has not had a recent decline in their functional status     Precautions / Restrictions Precautions Precautions: Fall Precaution Comments: mild fall risk Restrictions Weight Bearing Restrictions Per Provider Order: No      Mobility  Bed Mobility Overal bed mobility: Needs Assistance Bed Mobility: Supine to Sit     Supine to sit: Min assist     General bed mobility comments: Assist to sit up from stretcher    Transfers Overall transfer level: Independent Equipment used: None                     Ambulation/Gait Ambulation/Gait assistance: Supervision Gait Distance (Feet): 150 Feet Assistive device: None Gait Pattern/deviations: Step-through pattern, Decreased stride length, Wide base of support Gait velocity: decreased     General Gait Details: Intermittently reaching for single UE support of rail, no overt LOB, slower pace for age  Stairs            Wheelchair Mobility     Tilt Bed    Modified Rankin (Stroke Patients Only)       Balance Overall balance assessment: Mild deficits observed, not formally tested                                           Pertinent Vitals/Pain Pain Assessment Pain Assessment: Faces Faces Pain Scale: Hurts a little bit Pain Location: chest (from compressions) Pain Descriptors / Indicators: Sore Pain Intervention(s): Monitored during session    Home Living Family/patient expects to be discharged to:: Private residence Living Arrangements: Spouse/significant other;Other relatives (fiance, brother) Available Help at Discharge: Family Type of Home: House Home Access: Stairs to enter   Entrance Stairs-Number of Steps: 4   Home Layout: Able to live on main level with bedroom/bathroom Home Equipment: [employer] (2 wheels);Cane - single point;Wheelchair - manual      Prior Function Prior Level of Function : Independent/Modified Independent;Driving  Extremity/Trunk Assessment   Upper Extremity Assessment Upper Extremity Assessment: Overall WFL for tasks assessed    Lower Extremity Assessment Lower Extremity Assessment: Overall WFL for tasks assessed       Communication   Communication Communication: No apparent difficulties  Cognition Arousal: Alert Behavior During Therapy: WFL for tasks assessed/performed Overall Cognitive Status: Within Functional Limits for tasks assessed                                          General Comments       Exercises     Assessment/Plan    PT Assessment Patient does not need any further PT services  PT Problem List         PT Treatment Interventions      PT Goals (Current goals can be found in the Care Plan section)  Acute Rehab PT Goals Patient Stated Goal: to go home PT Goal Formulation: All assessment and education complete, DC therapy    Frequency       Co-evaluation               AM-PAC PT 6 Clicks Mobility  Outcome Measure Help needed turning from your back to your side while in a flat bed without using bedrails?: None Help needed moving from lying on your back to sitting on the side of a flat bed without using bedrails?: None Help needed moving to and from a bed to a chair (including a wheelchair)?: None Help needed standing up from a chair using your arms (e.g., wheelchair or bedside chair)?: None Help needed to walk in hospital room?: A Little Help needed climbing 3-5 steps with a railing? : A Little 6 Click Score: 22    End of Session   Activity Tolerance: Patient tolerated treatment well Patient left: in bed;with call bell/phone within reach;with family/visitor present Nurse Communication: Mobility status PT Visit Diagnosis: Unsteadiness on feet (R26.81)    Time: 8699-8681 PT Time Calculation (min) (ACUTE ONLY): 18 min   Charges:   PT Evaluation $PT Eval Low Complexity: 1 Low   PT General Charges $$ ACUTE PT VISIT: 1 Visit         Aleck Daring, PT, DPT Acute Rehabilitation Services Office [phone]   Alayne ONEIDA Daring 11/29/2023, 1:23 PM

## 2023-11-29 NOTE — ED Notes (Signed)
 Patient transported to MRI

## 2023-11-29 NOTE — ED Notes (Signed)
 Andrea Webster(s/o) aware of pt's current ED status.

## 2023-11-29 NOTE — Telephone Encounter (Signed)
 Pt in ED with a new chart created.  Encounter generated to get history.

## 2023-11-29 NOTE — ED Notes (Signed)
 Pt has returned from MRI.

## 2023-11-29 NOTE — H&P (Signed)
 History and Physical    Patient: Andrea Webster FMW:[id number] DOB: [birth year] DOA: 11/28/2023 DOS: the patient was seen and examined on 11/29/2023 PCP: Cleatus Arlyss RAMAN, MD  Patient coming from: Home  Chief Complaint:  Chief Complaint  Patient presents with   Choking   HPI: Andrea Webster is a 68 y.o. female with medical history significant of CAD, HTN, HLD.  Pt was in usual state of health today until suddenly she choked on a sandwich at dinner tonight.  Choking episode witnessed by husband who attempted abdominal thrusts (Heimlich maneuver), however pt went unresponsive.  Husband then attempted CPR until fire DTaP arrived and took over.  EMS arrived, pt had ROSC, EMS could see obstruction in airway and removed obstruction.  No shocks, no meds given.  Pt initially arrived on 15L NRB responsive to verbal stimuli.  This has improved to 4L slowly over the course of ED stay.  CXR neg.  CT head seems to show a subacute / chronic stroke.  Pt has no known h/o stroke however.  Pt started on unasyn  for ABx following aspiration episode  Pt significantly improved by time of my evaluation in ED.  Pt alert, oriented, drinking ice water.  Review of Systems: As mentioned in the history of present illness. All other systems reviewed and are negative.   Past Medical History:  Diagnosis Date   Anxiety    Arthritis    Baker's cyst    x 2   CAD (coronary artery disease)    a. s/p MI & PTCA - age 76;  b. 04/2015 PCI: LM nl, LAD 60p (FFR 0.83), D1 60ost, LCX 50p, 31m (2.75x15 Xience Alpine DES), OM1/2/3 min irregs, RCA 119m CTO; c. 08/2019 PCI: LM nl, LAD mod prox dzs, LCX patent stent w/ severe prox dzs (DES), RCA CTO mid.   Chronic sciatica    Complication of anesthesia    Constipation    Depression    intolerant of paxil, wellbutrin, zoloft   Diastolic dysfunction    a. 08/2019 Echo: EF 60-65%, gr1 DD, PASP .   GERD (gastroesophageal reflux disease)    Hayfever    headaches     Heart disease    Heart murmur    Heart valve disease    High cholesterol    Hypertension    Incisional hernia    Insomnia    Myocardial infarction (HCC) 08/2019   stented. first MI at age 4   Plantar fasciitis    Primary insomnia    Rectal polyp 03/2020   RLS (restless legs syndrome)    Sciatica    Sleep apnea    snores very loudly   Smoker    a. quit 01/2014.    Past Surgical History:  Procedure Laterality Date   ANGIOPLASTY  1997   APPENDECTOMY  1978   open   CARDIAC CATHETERIZATION     [facility] [place]   CARDIAC CATHETERIZATION N/A 04/30/2015   Procedure: Left Heart Cath;  Surgeon: Deatrice DELENA Cage, MD;  Location: [facility]IVE [facility];  Service: Cardiovascular;  Laterality: N/A;   CARPAL TUNNEL RELEASE     CESAREAN SECTION  1980   CESAREAN SECTION  1984   CHOLECYSTECTOMY N/A 03/30/2016   Procedure: LAPAROSCOPIC CHOLECYSTECTOMY WITH INTRAOPERATIVE CHOLANGIOGRAM;  Surgeon: Laneta JULIANNA Luna, MD;  Location: [facility] OR[facility];  Service: General;  Laterality: N/A;   COLONOSCOPY WITH PROPOFOL  N/A 06/26/2020   Procedure: COLONOSCOPY WITH PROPOFOL ;  Surgeon: Dessa Reyes ORN, MD;  Location: [facility] ENDOS[facility];  Service: Endoscopy;  Laterality: N/A;   CORONARY ANGIOPLASTY     [facility]    [facility]ON[facility]   CORONARY STENT INTERVENTION N/A 09/05/2019   Procedure: CORONARY STENT INTERVENTION;  Surgeon: Ammon Blunt, MD;  Location: [facility];  Service: Cardiovascular;  Laterality: N/A;   ESOPHAGOGASTRODUODENOSCOPY (EGD) WITH PROPOFOL  N/A 06/26/2020   Procedure: ESOPHAGOGASTRODUODENOSCOPY (EGD) WITH PROPOFOL ;  Surgeon: Dessa Reyes ORN, MD;  Location: [facility] ENDOS[facility];  Service: Endoscopy;  Laterality: N/A;   EXCISION OF MESH N/A 04/29/2020   Procedure: EXCISION OF MESH;  Surgeon: Dessa Reyes ORN, MD;  Location: [facility] OR[facility];  Service: General;  Laterality: N/A;   HERNIA REPAIR  11/2016   [facility];  Dr. Norleen Pin   LEFT HEART CATH AND CORONARY ANGIOGRAPHY N/A 09/05/2019   Procedure: LEFT HEART CATH AND CORONARY ANGIOGRAPHY;  Surgeon: Perla Evalene PARAS, MD;  Location: [facility];  Service: Cardiovascular;  Laterality: N/A;   VAGINAL HYSTERECTOMY     VENTRAL HERNIA REPAIR N/A 04/29/2020   Procedure: HERNIA REPAIR VENTRAL ADULT;  Surgeon: Dessa Reyes ORN, MD;  Location: [facility] OR[facility];  Service: General;  Laterality: N/A;  Ventral hernia repair with component separation- General anesthesia and exparel  block; Warren Raw, RNFA to assist     reports that she quit smoking about 8 years ago. Her smoking use included cigarettes. She started smoking about 28 years ago. She has a 6.6 pack-year smoking history. She has never used smokeless tobacco. She reports that she does not currently use alcohol. She reports that she does not use drugs.  Allergies  Allergen Reactions   Adhesive [Tape] Other (See Comments)    Don't use flex/fabric bandaids Paper tape okay   Paroxetine Hcl Other (See Comments)    Tearful on med, crying   Crestor  [Rosuvastatin  Calcium ] Other (See Comments)    aches   Effexor  [Venlafaxine ] Other (See Comments)    irritable   Lipitor [Atorvastatin ] Other (See Comments)    Intolerant. Muscle pain.  Takes a lower dose and does okay   Sertraline Hcl Other (See Comments)    intolerant   Wellbutrin [Bupropion Hcl] Other (See Comments)    Intolerant    Family History  Problem Relation Age of Onset   Heart disease Mother    Diabetes Mother    Hypertension Mother    Heart attack Mother    Coronary artery disease Mother    Alcohol abuse Father    Heart disease Father    Heart attack Father    Coronary artery disease Father    Diabetes Brother    Hypertension Brother    Breast cancer Maternal Grandmother 46   Colon cancer Neg Hx     Prior to Admission medications   Medication Sig Start Date End Date Taking? Authorizing Provider  albuterol  (VENTOLIN  HFA) 108 (90  Base) MCG/ACT inhaler INHALE 2 PUFFS EVERY 6 HOURS AS NEEDED FOR WHEEZING OR SHORTNESS OF BREATH 11/13/23   Cleatus Arlyss RAMAN, MD  ALPRAZolam  (XANAX ) 1 MG tablet TAKE 1 TABLET THREE TIMES DAILY AS NEEDED FOR ANXIETY 08/12/23   Cleatus Arlyss RAMAN, MD  aspirin  81 MG EC tablet Take 81 mg by mouth daily.     [provider]  cetirizine (ZYRTEC) 10 MG tablet Take 10 mg by mouth daily.    [provider]  Cholecalciferol (VITAMIN D) 50 MCG (2000 UT) CAPS Take 2,000 Units by mouth daily.  [provider]  cyclobenzaprine  (FLEXERIL ) 10 MG tablet TAKE 1/2 TO 1 TABLET AT BEDTIME 09/20/23   Cleatus Arlyss RAMAN, MD  fluticasone  (FLONASE ) 50 MCG/ACT nasal spray USE 2 SPRAYS IN EACH NOSTRIL ONE TIME DAILY 04/24/23   Cleatus Arlyss RAMAN, MD  isosorbide  mononitrate (IMDUR ) 60 MG 24 hr tablet TAKE 1 TABLET EVERY DAY 11/13/23   Cleatus Arlyss RAMAN, MD  ketotifen  (ZADITOR ) 0.025 % ophthalmic solution Place 1 drop into both eyes 2 (two) times daily. 02/28/22   Cleatus Arlyss RAMAN, MD  metoprolol  tartrate (LOPRESSOR ) 25 MG tablet TAKE 1 TABLET TWICE DAILY 11/13/23   Cleatus Arlyss RAMAN, MD  Multiple Vitamins-Minerals (MULTIVITAMIN WITH MINERALS) tablet Take 1 tablet by mouth daily.    [provider]  nitroGLYCERIN  (NITROSTAT ) 0.4 MG SL tablet DISSOLVE 1 TABLET UNDER THE TONGUE EVERY 5 MINUTES AS NEEDED FOR CHEST PAIN AS DIRECTED 04/11/22   Darron Deatrice LABOR, MD  pantoprazole  (PROTONIX ) 40 MG tablet TAKE 1 TABLET EVERY DAY 03/21/23   Cleatus Arlyss RAMAN, MD  REPATHA  140 MG/ML SOSY INJECT 140 MG INTO THE SKIN EVERY 14 DAYS. 11/13/23   Abigail Bernardino HERO, PA-C  senna (SENOKOT) 8.6 MG tablet Take 1 tablet by mouth at bedtime. Takes at night    [provider]  zolpidem  (AMBIEN ) 10 MG tablet TAKE 1 TABLET AT BEDTIME AS NEEDED FOR SLEEP 09/20/23   Cleatus Arlyss RAMAN, MD    Physical Exam: There were no vitals filed for this visit.  Constitutional: NAD, calm, comfortable Respiratory: Coarse BS  bilaterally. Cardiovascular: Regular rate and rhythm, no murmurs / rubs / gallops. No extremity edema. 2+ pedal pulses. No carotid bruits.  Abdomen: no tenderness, no masses palpated. No hepatosplenomegaly. Bowel sounds positive.  Neurologic: CN 2-12 grossly intact. Sensation intact, DTR normal. Strength 5/5 in all 4.  Psychiatric: Normal judgment and insight. Alert and oriented x 3. Normal mood.    Labs on Admission: I have personally reviewed following labs and imaging studies  CBC: Recent Labs  Lab 11/28/23 2217 11/28/23 2226  WBC 7.0  --   HGB 12.9 13.3  HCT 39.0 39.0  MCV 94.0  --   PLT 183  --    Basic Metabolic Panel: Recent Labs  Lab 11/28/23 2217 11/28/23 2226  NA 128* 130*  K 3.5 3.5  CL 95*  --   CO2 18*  --   GLUCOSE 186*  --   BUN 9  --   CREATININE 0.81  --   CALCIUM  8.5*  --    GFR: CrCl cannot be calculated (Unknown ideal Webster.). Liver Function Tests: Recent Labs  Lab 11/28/23 2217  AST 75*  ALT 48*  ALKPHOS 135*  BILITOT 1.4*  PROT 7.5  ALBUMIN 3.5   No results for input(s): LIPASE, AMYLASE in the last 168 hours. No results for input(s): AMMONIA in the last 168 hours. Coagulation Profile: No results for input(s): INR, PROTIME in the last 168 hours. Cardiac Enzymes: No results for input(s): CKTOTAL, CKMB, CKMBINDEX, TROPONINI in the last 168 hours. BNP (last 3 results) No results for input(s): PROBNP in the last 8760 hours. HbA1C: No results for input(s): HGBA1C in the last 72 hours. CBG: No results for input(s): GLUCAP in the last 168 hours. Lipid Profile: No results for input(s): CHOL, HDL, LDLCALC, TRIG, CHOLHDL, LDLDIRECT in the last 72 hours. Thyroid  Function Tests: No results for input(s): TSH, T4TOTAL, FREET4, T3FREE, THYROIDAB in the last 72 hours. Anemia Panel: No results for input(s): VITAMINB12, FOLATE, FERRITIN,  TIBC, IRON , RETICCTPCT in the last 72 hours. Urine  analysis: No results found for: COLORURINE, APPEARANCEUR, LABSPEC, PHURINE, GLUCOSEU, HGBUR, BILIRUBINUR, KETONESUR, PROTEINUR, UROBILINOGEN, NITRITE, LEUKOCYTESUR  Radiological Exams on Admission: CT Head Wo Contrast Result Date: 11/28/2023 CLINICAL DATA:  Neuro deficit, acute, stroke suspected. Choking then became unresponsive. EXAM: CT HEAD WITHOUT CONTRAST TECHNIQUE: Contiguous axial images were obtained from the base of the skull through the vertex without intravenous contrast. RADIATION DOSE REDUCTION: This exam was performed according to the departmental dose-optimization program which includes automated exposure control, adjustment of the mA and/or kV according to patient size and/or use of iterative reconstruction technique. COMPARISON:  None Available. FINDINGS: Brain: There is a hypodense nonhemorrhagic infarct band in the right frontal deep white matter, extending from the superior body of the right lateral ventricle to the superior insular cortex and measuring 2.1 x 2.1 x 1.3 cm. There is a slight volume loss associated with this. This is most likely either a chronic infarct or a late-subacute infarct in the late stages of evolution. Elsewhere, there is mild cerebral atrophy and small-vessel disease, unremarkable cerebellum and brainstem and no other appreciable infarct. There is no hemorrhage, mass effect or midline shift. The ventricles are normal in size and position. Vascular: Both carotid siphons are heavily calcified. No hyperdense central vessel is seen. Skull: Negative for fracture or focal lesions. Sinuses/Orbits: Left mastoid air cells are clear. There is patchy fluid in the lower right mastoid air cells. Clear paranasal sinuses. Right-sided nasoenteric intubation partially visible. Bilateral proptosis. No extraocular muscle thickening. Symmetric increased intraorbital fat which may be seen with obesity and chronic use of corticosteroids. Other: None. IMPRESSION: 1.  2.1 cm hypodense nonhemorrhagic infarct band in the right frontal deep white matter, most likely either a chronic infarct or a late-subacute infarct in the late stages of evolution. MRI follow-up may be helpful. 2. Atrophy and small-vessel disease. 3. Carotid atherosclerosis. 4. Bilateral proptosis with increased intraorbital fat which may be seen with obesity and chronic use of corticosteroids. 5. Patchy fluid in the lower right mastoid air cells. Electronically Signed   By: Francis Quam M.D.   On: 11/28/2023 23:15   DG Chest Portable 1 View Result Date: 11/28/2023 CLINICAL DATA:  Post CPR, choked on a sandwich and went unresponsive EXAM: PORTABLE CHEST 1 VIEW COMPARISON:  None Available. FINDINGS: Normal cardiomediastinal silhouette. No focal consolidation, pleural effusion, or pneumothorax. No displaced rib fractures. IMPRESSION: No acute cardiopulmonary disease. Electronically Signed   By: Norman Gatlin M.D.   On: 11/28/2023 22:31    EKG: Independently reviewed. Assessment and Plan: * Cardiorespiratory arrest (HCC) Respiratory arrest and possible cardiac arrest following choking episode. Thankfully CPR measures have saved patients life and wellbeing. Pt awake and talking in ED despite events of earlier this evening.  Choking Pt passed bedside swallow and already drinking ice water in ED. With that said, keeping pt NPO except for sips and meds given the choking episode leading to respiratory arrest earlier this evening. SLP eval and treat  Acute respiratory failure with hypoxia (HCC) Persistent new O2 requirement suspected to be due to aspiration, this has slowly improved over course of time in ED. 4L via [place] at this point. Cont pulse ox Tele monitor  Aspiration into airway Pt with presumed aspiration post choking episode. Likely aspiration pneumonitis at this point.  At risk for developing aspiration PNA. EDP started empiric unasyn , will continue this Watch for development of  SIRS Check repeat CBC in AM  Abnormal CT of  the head Abnormal hypodensity noted on CT scan R frontal that looks like maybe a subacute to chronic infarct?  Does not correlate with recent history / todays events. MRI official read is pending. But I'm not seeing anything stand out on DWI suggestive of acute infarct to me.      Advance Care Planning:   Code Status: Full Code  Consults: None  Family Communication: Family at bedside  Severity of Illness: The appropriate patient status for this patient is OBSERVATION. Observation status is judged to be reasonable and necessary in order to provide the required intensity of service to ensure the patient's safety. The patient's presenting symptoms, physical exam findings, and initial radiographic and laboratory data in the context of their medical condition is felt to place them at decreased risk for further clinical deterioration. Furthermore, it is anticipated that the patient will be medically stable for discharge from the hospital within 2 midnights of admission.   Author: Monifa Blanchette M., DO 11/29/2023 12:31 AM  For on call review [email or web address].

## 2023-11-29 NOTE — ED Notes (Signed)
 CRITICAL VALUE STICKER  CRITICAL VALUE:trop 111  RECEIVER (on-site recipient of call):Amarra Sawyer  DATE & TIME NOTIFIED: 11/29/23 0645  MESSENGER (representative from lab):julisa  MD NOTIFIED: gardner  TIME OF NOTIFICATION:0650  RESPONSE:

## 2023-11-29 NOTE — Assessment & Plan Note (Signed)
 Pt with presumed aspiration post choking episode. Likely aspiration pneumonitis at this point.  At risk for developing aspiration PNA. EDP started empiric unasyn, will continue this Watch for development of SIRS Check repeat CBC in AM

## 2023-11-29 NOTE — Evaluation (Signed)
 Clinical/Bedside Swallow Evaluation Patient Details  Name: Andrea Webster MRN: [id number] Date of Birth: Mar 15, 1956  Today's Date: 11/29/2023 Time: SLP Start Time (ACUTE ONLY): 9183 SLP Stop Time (ACUTE ONLY): 0827 SLP Time Calculation (min) (ACUTE ONLY): 11 min  Past Medical History: No past medical history on file. Past Surgical History:  The histories are not reviewed yet. Please review them in the History navigator section and refresh this SmartLink. HPI:  Andrea Webster is a 68 y.o. female who presented following a choking epsiode during which she became unresponsive and required CPR. CXR and Head CT 1/8 without acute findings.  [employer], but kept NPO prior to this eval d/t circumstances of admission.  Pt with medical history significant of CAD, HTN, HLD.    Assessment / Plan / Recommendation  Clinical Impression  Pt presents with functional swallowing as assessed clinically.  Pt tolerated all consistencies trialed with no clinical s/s of aspiration, including serial sips of thin liquid by straw.  Pt exhibited good oral clearance of solids despite not having her bottom denture in place.  Pt reports no swallowing difficulty prior to choking even that led to admission. She denies any ongoing swallowing difficulty.  She does not have any concerns about resuming PO intake.  She states she will avoid spinach on sandwiches in the future, and likely may not return to that restaurant.  She always removes stems from spinach at home.  She does not exhibit any indication of ongoing dysphagia and is at low risk for silent aspiration.  Pt may resume PO diet, choosing foods that are easier for her to eat and drink while her dentures are not available.  Pt has no further ST needs. SLP will sign off.    Recommend regular texture diet with thin liquids.  Administer medications as tolerated.   SLP Visit Diagnosis: Dysphagia, unspecified (R13.10)    Aspiration Risk  No limitations    Diet Recommendation  Regular;Thin liquid    Liquid Administration via: Cup;Straw Medication Administration: Whole meds with liquid Supervision: Patient able to self feed Compensations: Slow rate;Small sips/bites Postural Changes: Seated upright at 90 degrees    Other  Recommendations Oral Care Recommendations: Oral care BID    Recommendations for follow up therapy are one component of a multi-disciplinary discharge planning process, led by the attending physician.  Recommendations may be updated based on patient status, additional functional criteria and insurance authorization.  Follow up Recommendations No SLP follow up      Assistance Recommended at Discharge  N/A  Functional Status Assessment Patient has not had a recent decline in their functional status  Frequency and Duration  (N/A)          Prognosis Prognosis for improved oropharyngeal function:  (N/A)      Swallow Study   General Date of Onset: 11/28/23 HPI: Andrea Webster is a 68 y.o. female who presented following a choking epsiode during which she became unresponsive and required CPR. CXR and Head CT 1/8 without acute findings.  [employer], but kept NPO prior to this eval d/t circumstances of admission.  Pt with medical history significant of CAD, HTN, HLD. Type of Study: Bedside Swallow Evaluation Previous Swallow Assessment: none Diet Prior to this Study: NPO Temperature Spikes Noted: No History of Recent Intubation: No Behavior/Cognition: Alert;Cooperative;Pleasant mood Oral Cavity Assessment: Within Functional Limits Oral Care Completed by SLP: No Oral Cavity - Dentition: Dentures, top (bottom dentures unavailable) Vision: Functional for self-feeding Self-Feeding Abilities: Able to feed self Patient Positioning: Upright  in bed Baseline Vocal Quality: Normal Volitional Cough: Strong Volitional Swallow: Able to elicit    Oral/Motor/Sensory Function Overall Oral Motor/Sensory Function: Within functional limits Facial ROM:  Within Functional Limits Facial Symmetry: Within Functional Limits Lingual ROM: Within Functional Limits Lingual Symmetry: Within Functional Limits Lingual Strength: Within Functional Limits Velum: Within Functional Limits Mandible: Within Functional Limits   Ice Chips Ice chips: Not tested   Thin Liquid Thin Liquid: Within functional limits Presentation: Straw    Nectar Thick Nectar Thick Liquid: Not tested   Honey Thick Honey Thick Liquid: Not tested   Puree Puree: Within functional limits Presentation: Spoon   Solid     Solid: Within functional limits Presentation: Self Fed      Anette FORBES Grippe, MA, CCC-SLP Acute Rehabilitation Services Office: [phone] 11/29/2023,8:49 AM

## 2023-11-29 NOTE — Discharge Instructions (Signed)

## 2023-11-29 NOTE — Progress Notes (Signed)
 Pharmacy Antibiotic Note  Andrea Webster is a 68 y.o. female admitted on 11/28/2023 with aspiration pneumonia s/p respiratory arrest from choking event. Pharmacy has been consulted for unasyn  dosing.  Plan: Unasyn  1.5g q6h  F/u renal function, infectious work up and length of therapy    Temp (24hrs), Avg:97 F (36.1 C), Min:97 F (36.1 C), Max:97 F (36.1 C)  Recent Labs  Lab 11/28/23 2217  WBC 7.0  CREATININE 0.81    CrCl cannot be calculated (Unknown ideal weight.).    Not on File  Antimicrobials this admission: Unasyn  1/7 >  Thank you for allowing pharmacy to be a part of this patient's care.  Leonor GORMAN Bash 11/29/2023 1:51 AM

## 2023-11-29 NOTE — Discharge Summary (Signed)
 Physician Discharge Summary  Princesa Willig FMW:[id number] DOB: 1956/07/26  PCP: Cleatus Arlyss RAMAN, MD  Admitted from: Home Discharged to: Home  Admit date: 11/28/2023 Discharge date: 11/29/2023  Recommendations for Outpatient Follow-up:    Follow-up Information     Cleatus Arlyss RAMAN, MD Follow up in 5 day(s).   Specialty: Family Medicine Why: Repeat labs (CBC & BMP), Hospital Follow Up Contact information: [address] [place] [address] [phone]                  Home Health: None    Equipment/Devices: None    Discharge Condition: Improved and stable   Code Status: Full Code Diet recommendation: Heart healthy diet    Discharge Diagnoses:  Principal Problem:   Cardiorespiratory arrest (HCC) Active Problems:   Choking   Aspiration into airway   Acute respiratory failure with hypoxia (HCC)   Abnormal CT of the head   Brief Summary: 68 year old female, lives with her fianc and son, independent, medical history significant for CAD, HTN, HLD, tobacco and alcohol use, presented to the ED after she was witnessed to be choking on a sandwich at dinner on night of admission, failed abdominal thrust (Heimlich maneuver) by fianc, patient then became unresponsive, fianc attempted CPR until fire department arrived, patient had ROSC and EMS were able to clear airway of obstruction.  No shocks or meds given.  Initially on 15 L/min NRB and responsive to verbal stimuli, since then has been weaned to room air and fully oriented.  Admitted for Respiratory/cardiorespiratory arrest following choking episode.  Assessment and plan:  Cardiorespiratory arrest following witnessed choking episode at home: Unclear if this was just a respiratory arrest or possible cardiac arrest following choking CPR on field with ROSC Was on 15 L/min NRB oxygen on arrival, currently weaned to room air Telemetry shows sinus rhythm without arrhythmias HS Troponin peaked to 111, suspected  due to demand ischemia from choking episode and CPR, low index of suspicion for ACS. TTE shows preserved LVEF with no wall motion abnormalities.  Choking episode Patient reports that she choked on the stem of spinach while eating [employer] sandwich and she was unable to remove it herself Past bedside swallow eval and tolerated ice water in ED As per SLP, recommends a regular consistency diet and thin liquids and no indication to pursue MBS.  Acute respiratory failure with hypoxia Had transient tachypnea up to 28/min.  Likely related to choking episode Resolved. May have had some aspiration but chest x-ray negative and low index for pneumonia Although on IV Unasyn  here, no antibiotics at discharge.  Patient advised to monitor closely for any worsening such as fever, cough, difficulty breathing in which case she is advised to seek immediate medical attention.  Abnormal CT/MRI brain ?  Tiny left SDH, likely subacute to chronic in nature.  This was not visible on CT but seen on MRI.  Also small left frontal meningioma without mass effect. I personally discussed imaging report with Dr. Alm Molt, neurosurgery on-call who was not even sure if what was seen was a true subdural hematoma.  Recommends no intervention for above and can follow-up with PCP with repeat imaging in about 1 year.  Hyponatremia Serum sodium stable in the 128-130 range.  Do not know if this is chronic, no prior labs to compare in [facility] or Care Everywhere Clinically euvolemic.  No AMS Outpatient follow-up with PCP at discharge.  Non-anion gap metabolic acidosis Unclear etiology Brief oral bicarbonate supplements and follow  BMP early next week with PCP.  Anemia Possibly dilutional.  Outpatient follow-up with repeat CBC  Abnormal LFTs Mild transaminitis Unclear etiology but improved  CAD No anginal symptoms. HS Troponin peaked to 111 but then downtrending to 75 Elevated troponin likely related to demand ischemia. 2D  echo with preserved LVEF and no wall motion abnormalities. Continue home aspirin , metoprolol  tartrate, Imdur .  HTN Controlled.  Continue home dose of metoprolol  tartrate.  Hyperlipidemia Continue Repatha   Suspected GERD Continue PPI  Anxiety disorder Continue home dose of Xanax .  Bilateral proptosis Seen on CT of the head although did not appreciate this clinically. Unclear etiology.  Outpatient follow-up. Clinically euthyroid.  Body mass index is 47.85 kg/m.  Morbid obesity Outpatient follow-up.   Consultations: None  Procedures: None   Discharge Instructions  Discharge Instructions     Activity as tolerated - No restrictions   Complete by: As directed    Call MD for:  difficulty breathing, headache or visual disturbances   Complete by: As directed    Call MD for:  extreme fatigue   Complete by: As directed    Call MD for:  persistant dizziness or light-headedness   Complete by: As directed    Call MD for:  persistant nausea and vomiting   Complete by: As directed    Call MD for:  severe uncontrolled pain   Complete by: As directed    Call MD for:  temperature >100.4   Complete by: As directed    Diet - low sodium heart healthy   Complete by: As directed         Medication List     TAKE these medications    albuterol  108 (90 Base) MCG/ACT inhaler Commonly known as: VENTOLIN  HFA Inhale 2 puffs into the lungs every 6 (six) hours as needed for wheezing or shortness of breath.   ALPRAZolam  1 MG tablet Commonly known as: XANAX  Take 1 mg by mouth 3 (three) times daily as needed for anxiety.   aspirin  EC 81 MG tablet Take 81 mg by mouth daily. Swallow whole.   cetirizine 10 MG chewable tablet Commonly known as: ZYRTEC Chew 10 mg by mouth daily.   cyclobenzaprine  10 MG tablet Commonly known as: FLEXERIL  Take 10 mg by mouth 3 (three) times daily as needed for muscle spasms.   fluticasone  50 MCG/ACT nasal spray Commonly known as: FLONASE  Place  1 spray into both nostrils in the morning and at bedtime.   isosorbide  mononitrate 60 MG 24 hr tablet Commonly known as: IMDUR  Take 60 mg by mouth daily.   metoprolol  tartrate 25 MG tablet Commonly known as: LOPRESSOR  Take 25 mg by mouth 2 (two) times daily.   multivitamin with minerals Tabs tablet Take 1 tablet by mouth daily.   nitroGLYCERIN  0.4 MG SL tablet Commonly known as: NITROSTAT  Place 0.4 mg under the tongue every 5 (five) minutes as needed for chest pain.   pantoprazole  40 MG tablet Commonly known as: PROTONIX  Take 40 mg by mouth daily.   Repatha  140 MG/ML Sosy Generic drug: Evolocumab  Inject 140 mg into the skin every 14 (fourteen) days.   sodium bicarbonate 650 MG tablet Take 1 tablet (650 mg total) by mouth 2 (two) times daily for 5 days.   zolpidem  10 MG tablet Commonly known as: AMBIEN  Take 10 mg by mouth at bedtime.       Allergies  Allergen Reactions   Atorvastatin      myalgia   Effexor  [Venlafaxine ]     Irritable  Paxil [Paroxetine]     Becomes tearful   Rosuvastatin      Myalgia    Sertraline     Irritable    Wellbutrin [Bupropion]     Felt crazy   Tape Rash    Paper tape tolerated      Procedures/Studies: ECHOCARDIOGRAM COMPLETE Result Date: 11/29/2023    ECHOCARDIOGRAM REPORT   Patient Name:   Zury Mariotti Date of Exam: 11/29/2023 Medical Rec #:  [id number]    Height:       60.0 in Accession #:    [id number]   Weight:       244.9 lb Date of Birth:  22-Jul-1956   BSA:          2.035 m Patient Age:    67 years     BP:           122/54 mmHg Patient Gender: F            HR:           71 bpm. Exam Location:  Inpatient Procedure: 2D Echo, Color Doppler, Cardiac Doppler and Intracardiac            Opacification Agent Indications:    Cardiac Arrest i46.9  History:        Patient has prior history of Echocardiogram examinations, most                 recent 09/05/2019. CAD; Risk Factors:Hypertension and                 Dyslipidemia.  Sonographer:     Damien Senior RDCS Referring Phys: [id number] Jesse Nosbisch D Abhijay Morriss  Sonographer Comments: Technically difficult study due to patient body habitus. IMPRESSIONS  1. Left ventricular ejection fraction, by estimation, is 60 to 65%. The left ventricle has normal function. The left ventricle has no regional wall motion abnormalities. Left ventricular diastolic parameters are consistent with Grade I diastolic dysfunction (impaired relaxation).  2. Right ventricular systolic function is normal. The right ventricular size is normal. There is normal pulmonary artery systolic pressure.  3. No evidence of mitral valve regurgitation. Moderate to severe mitral annular calcification.  4. The aortic valve was not well visualized. Aortic valve regurgitation is not visualized.  5. The inferior vena cava is normal in size with greater than 50% respiratory variability, suggesting right atrial pressure of 3 mmHg. FINDINGS  Left Ventricle: Left ventricular ejection fraction, by estimation, is 60 to 65%. The left ventricle has normal function. The left ventricle has no regional wall motion abnormalities. Definity  contrast agent was given IV to delineate the left ventricular  endocardial borders. The left ventricular internal cavity size was normal in size. There is no left ventricular hypertrophy. Left ventricular diastolic parameters are consistent with Grade I diastolic dysfunction (impaired relaxation). Right Ventricle: The right ventricular size is normal. Right ventricular systolic function is normal. There is normal pulmonary artery systolic pressure. The tricuspid regurgitant velocity is 2.10 m/s, and with an assumed right atrial pressure of 3 mmHg,  the estimated right ventricular systolic pressure is 20.6 mmHg. Left Atrium: Left atrial size was not well visualized. Right Atrium: Right atrial size was not well visualized. Pericardium: Trivial pericardial effusion is present. Mitral Valve: Moderate to severe mitral annular calcification. No  evidence of mitral valve regurgitation. The mean mitral valve gradient is 3.0 mmHg. Tricuspid Valve: Tricuspid valve regurgitation is mild. Aortic Valve: The aortic valve was not well visualized. Aortic valve regurgitation is not visualized. Pulmonic Valve: Pulmonic valve  regurgitation is not visualized. Aorta: The aortic root and ascending aorta are structurally normal, with no evidence of dilitation. Venous: The inferior vena cava is normal in size with greater than 50% respiratory variability, suggesting right atrial pressure of 3 mmHg. IAS/Shunts: The interatrial septum was not well visualized.  LEFT VENTRICLE PLAX 2D LVIDd:         4.20 cm   Diastology LVIDs:         2.60 cm   LV e' medial:    4.97 cm/s LV PW:         1.10 cm   LV E/e' medial:  19.3 LV IVS:        0.90 cm   LV e' lateral:   5.59 cm/s LVOT diam:     2.10 cm   LV E/e' lateral: 17.1 LV SV:         765 LV SV Index:   376 LVOT Area:     3.46 cm  RIGHT VENTRICLE RV S prime:     107.00 cm/s TAPSE (M-mode): 2.1 cm LEFT ATRIUM           Index LA diam:      4.70 cm 2.31 cm/m LA Vol (A2C): 55.9 ml 27.48 ml/m LA Vol (A4C): 75.7 ml 37.21 ml/m  AORTIC VALVE LVOT Vmax:   109.00 cm/s LVOT Vmean:  78.900 cm/s LVOT VTI:    2.210 m  AORTA Ao Root diam: 3.20 cm Ao Asc diam:  3.40 cm MITRAL VALVE                TRICUSPID VALVE MV Mean grad: 3.0 mmHg      TR Peak grad:   17.6 mmHg MV E velocity: 95.70 cm/s   TR Vmax:        210.00 cm/s MV A velocity: 121.00 cm/s MV E/A ratio:  0.79         SHUNTS                             Systemic VTI:  2.21 m                             Systemic Diam: 2.10 cm Ronal Ross Electronically signed by Ronal Ross Signature Date/Time: 11/29/2023/1:33:43 PM    Final    MR BRAIN WO CONTRAST Result Date: 11/29/2023 CLINICAL DATA:  Initial evaluation for mental status change, unknown cause. EXAM: MRI HEAD WITHOUT CONTRAST TECHNIQUE: Multiplanar, multiecho pulse sequences of the brain and surrounding structures were obtained without  intravenous contrast. COMPARISON:  Prior CT from 11/28/2023. FINDINGS: Brain: Generalized age-related cerebral atrophy. Patchy T2/FLAIR hyperintensity involving the periventricular deep white matter both cerebral hemispheres, consistent with chronic small vessel ischemic disease, mild in nature. Remote infarct involving the right basal ganglia with extension towards the right insula noted. No evidence for acute or subacute ischemia. Gray-white matter differentiation maintained. Trace extra-axial FLAIR signal intensity seen overlying the left cerebral convexity (series 11, image 31). This measures no more than 1-2 mm in maximal thickness without mass effect. Possible associated susceptibility artifact noted. Finding suspicious for a tiny subdural collection, suspected to reflect a trace subacute to chronic subdural hematoma. This is not visible on prior CT. No other acute or chronic intracranial hemorrhage. 5 mm extra-axial nodular density overlying the anterior left frontal convexity, possibly reflecting a small meningioma (series 11, image 35). No associated mass effect. No other  mass lesion or midline shift. No hydrocephalus. Pituitary gland suprasellar region within normal limits. Vascular: Major intracranial vascular flow voids are maintained. Skull and upper cervical spine: Craniocervical junction within normal limits. Bone marrow signal intensity overall within normal limits. No scalp soft tissue abnormality. Sinuses/Orbits: Globes orbital soft tissues within normal limits. Paranasal sinuses are largely clear. Trace bilateral mastoid effusions, of doubtful significance. Other: None. IMPRESSION: 1. No acute intracranial abnormality. 2. Question trace extra-axial FLAIR signal intensity overlying the left cerebral convexity measuring no more than 1-2 mm in maximal thickness without mass effect. Finding suspicious for a tiny subdural hematoma, likely subacute to chronic in nature. This is not visible on prior CT.  3. 5 mm extra-axial nodular density overlying the anterior left frontal convexity, possibly reflecting a small meningioma. No associated mass effect. 4. Underlying age-related cerebral atrophy with mild chronic small vessel ischemic disease, with remote infarct involving the right basal ganglia/insula. Electronically Signed   By: Morene Hoard M.D.   On: 11/29/2023 02:02   CT Head Wo Contrast Result Date: 11/28/2023 CLINICAL DATA:  Neuro deficit, acute, stroke suspected. Choking then became unresponsive. EXAM: CT HEAD WITHOUT CONTRAST TECHNIQUE: Contiguous axial images were obtained from the base of the skull through the vertex without intravenous contrast. RADIATION DOSE REDUCTION: This exam was performed according to the departmental dose-optimization program which includes automated exposure control, adjustment of the mA and/or kV according to patient size and/or use of iterative reconstruction technique. COMPARISON:  None Available. FINDINGS: Brain: There is a hypodense nonhemorrhagic infarct band in the right frontal deep white matter, extending from the superior body of the right lateral ventricle to the superior insular cortex and measuring 2.1 x 2.1 x 1.3 cm. There is a slight volume loss associated with this. This is most likely either a chronic infarct or a late-subacute infarct in the late stages of evolution. Elsewhere, there is mild cerebral atrophy and small-vessel disease, unremarkable cerebellum and brainstem and no other appreciable infarct. There is no hemorrhage, mass effect or midline shift. The ventricles are normal in size and position. Vascular: Both carotid siphons are heavily calcified. No hyperdense central vessel is seen. Skull: Negative for fracture or focal lesions. Sinuses/Orbits: Left mastoid air cells are clear. There is patchy fluid in the lower right mastoid air cells. Clear paranasal sinuses. Right-sided nasoenteric intubation partially visible. Bilateral proptosis. No  extraocular muscle thickening. Symmetric increased intraorbital fat which may be seen with obesity and chronic use of corticosteroids. Other: None. IMPRESSION: 1. 2.1 cm hypodense nonhemorrhagic infarct band in the right frontal deep white matter, most likely either a chronic infarct or a late-subacute infarct in the late stages of evolution. MRI follow-up may be helpful. 2. Atrophy and small-vessel disease. 3. Carotid atherosclerosis. 4. Bilateral proptosis with increased intraorbital fat which may be seen with obesity and chronic use of corticosteroids. 5. Patchy fluid in the lower right mastoid air cells. Electronically Signed   By: Francis Quam M.D.   On: 11/28/2023 23:15   DG Chest Portable 1 View Result Date: 11/28/2023 CLINICAL DATA:  Post CPR, choked on a sandwich and went unresponsive EXAM: PORTABLE CHEST 1 VIEW COMPARISON:  None Available. FINDINGS: Normal cardiomediastinal silhouette. No focal consolidation, pleural effusion, or pneumothorax. No displaced rib fractures. IMPRESSION: No acute cardiopulmonary disease. Electronically Signed   By: Norman Gatlin M.D.   On: 11/28/2023 22:31      Subjective: History as noted above.  Denies complaints.  States that she occasionally drinks alcohol and smokes cigarettes  and had some yesterday.  No other complaints.  Denies difficulty swallowing either to liquids or solids.  No chest pain, dyspnea or cough.  Discharge Exam:  Vitals:   11/29/23 0900 11/29/23 1037 11/29/23 1200 11/29/23 1442  BP: 134/65  (!) 122/54   Pulse: 86  70   Resp: 20  (!) 22   Temp:  97.7 F (36.5 C)  97.7 F (36.5 C)  TempSrc:  Oral  Oral  SpO2: 96%  96%   Weight:      Height:        General: Middle-age female, moderately built and obese sitting up comfortably in bed without distress. Cardiovascular: S1 & S2 heard, RRR, S1/S2 +. No murmurs, rubs, gallops or clicks. No JVD or pedal edema.  Telemetry personally reviewed: Sinus rhythm without  arrhythmias. Respiratory: Clear to auscultation without wheezing, rhonchi or crackles. No increased work of breathing. Abdominal:  Non distended, non tender & soft. No organomegaly or masses appreciated. Normal bowel sounds heard. CNS: Alert and oriented. No focal deficits. Extremities: no edema, no cyanosis    The results of significant diagnostics from this hospitalization (including imaging, microbiology, ancillary and laboratory) are listed below for reference.     Microbiology: No results found for this or any previous visit (from the past 240 hours).   Labs: CBC: Recent Labs  Lab 11/28/23 2217 11/28/23 2226 11/29/23 0216  WBC 7.0  --  7.8  HGB 12.9 13.3 11.5*  HCT 39.0 39.0 34.1*  MCV 94.0  --  92.2  PLT 183  --  161    Basic Metabolic Panel: Recent Labs  Lab 11/28/23 2217 11/28/23 2226 11/29/23 0216  NA 128* 130* 128*  K 3.5 3.5 3.8  CL 95*  --  96*  CO2 18*  --  17*  GLUCOSE 186*  --  134*  BUN 9  --  9  CREATININE 0.81  --  0.76  CALCIUM  8.5*  --  8.2*    Liver Function Tests: Recent Labs  Lab 11/28/23 2217 11/29/23 0216  AST 75* 58*  ALT 48* 44  ALKPHOS 135* 112  BILITOT 1.4* 1.1  PROT 7.5 6.6  ALBUMIN 3.5 3.2*    CBG: Recent Labs  Lab 11/28/23 2216  GLUCAP 190*    Unsuccessfully attempted to reach patient's significant other who she referred to as fianc but listed in chart as spouse.  Left VM to call back as needed.    Time coordinating discharge: 40 minutes  SIGNED:  Trenda Mar, MD,  FACP, [facility], [facility], [facility]   [facility] [place]     To contact the attending provider between 7A-7P or the covering provider during after hours 7P-7A, please log into the web site [email or web address] and access using universal [place] password for that web site. If you do not have the password, please call the hospital operator.

## 2023-11-29 NOTE — Assessment & Plan Note (Signed)
 Respiratory arrest and possible cardiac arrest following choking episode. Thankfully CPR measures have saved patients life and wellbeing. Pt awake and talking in ED despite events of earlier this evening.

## 2023-11-29 NOTE — ED Notes (Signed)
 Elevated Trop, As per Talbert Nan RN, Dr Julian Reil notified.

## 2023-11-29 NOTE — Assessment & Plan Note (Addendum)
 Pt passed bedside swallow and already drinking ice water in ED. With that said, keeping pt NPO except for sips and meds given the choking episode leading to respiratory arrest earlier this evening. SLP eval and treat

## 2023-11-29 NOTE — Assessment & Plan Note (Addendum)
 Persistent new O2 requirement suspected to be due to aspiration, this has slowly improved over course of time in ED. 4L via [place] at this point. Cont pulse ox Tele monitor

## 2023-11-29 NOTE — Assessment & Plan Note (Signed)
 Abnormal hypodensity noted on CT scan R frontal that looks like maybe a subacute to chronic infarct?  Does not correlate with recent history / todays events. MRI official read is pending. But I'm not seeing anything stand out on DWI suggestive of acute infarct to me.

## 2023-12-07 ENCOUNTER — Telehealth: Payer: Self-pay | Admitting: Family Medicine

## 2023-12-07 NOTE — Telephone Encounter (Signed)
Spoke with Verlon Au the call was for [facility]. She was able to answer my question

## 2023-12-07 NOTE — Telephone Encounter (Signed)
Copied from CRM [id number]. Topic: General - Other >> Dec 07, 2023 10:00 AM Florestine Avers wrote: Reason for CRM: Patient had a missed call from the clinic, no voicemail was left.

## 2023-12-08 ENCOUNTER — Other Ambulatory Visit: Payer: Self-pay

## 2023-12-08 MED ORDER — NITROGLYCERIN 0.4 MG SL SUBL: SUBLINGUAL_TABLET | SUBLINGUAL | 0 refills | Status: DC

## 2023-12-08 NOTE — Telephone Encounter (Signed)
Called patient but got recording stating mailbox full.

## 2023-12-11 ENCOUNTER — Other Ambulatory Visit: Payer: Self-pay | Admitting: Family Medicine

## 2023-12-11 MED ORDER — CYCLOBENZAPRINE HCL 10 MG PO TABS: 10.0000 mg | ORAL_TABLET | Freq: Every day | ORAL | 1 refills | Status: DC | PRN

## 2023-12-11 MED ORDER — ISOSORBIDE MONONITRATE ER 60 MG PO TB24: 60.0000 mg | ORAL_TABLET | Freq: Every day | ORAL | 3 refills | Status: DC

## 2023-12-11 MED ORDER — METOPROLOL TARTRATE 25 MG PO TABS: 25.0000 mg | ORAL_TABLET | Freq: Two times a day (BID) | ORAL | 3 refills | Status: DC

## 2023-12-11 MED ORDER — ZOLPIDEM TARTRATE 10 MG PO TABS: 10.0000 mg | ORAL_TABLET | Freq: Every evening | ORAL | 1 refills | Status: DC | PRN

## 2023-12-11 MED ORDER — ALPRAZOLAM 1 MG PO TABS: 1.0000 mg | ORAL_TABLET | Freq: Three times a day (TID) | ORAL | 2 refills | Status: DC | PRN

## 2023-12-11 MED ORDER — PANTOPRAZOLE SODIUM 40 MG PO TBEC: DELAYED_RELEASE_TABLET | ORAL | 3 refills | Status: DC

## 2023-12-12 ENCOUNTER — Telehealth: Payer: Self-pay | Admitting: Physician Assistant

## 2023-12-12 NOTE — Telephone Encounter (Signed)
Called patient but got recording stating mailbox full.

## 2023-12-12 NOTE — Telephone Encounter (Signed)
 *  STAT* If patient is at the pharmacy, call can be transferred to refill team.   1. Which medications need to be refilled? (please list name of each medication and dose if known) nitroGLYCERIN (NITROSTAT) 0.4 MG SL tablet    2. Would you like to learn more about the convenience, safety, & potential cost savings by using the [facility]?    3. Are you open to using the [facility] (Type [facility].   4. Which pharmacy/location (including street and city if local pharmacy) is medication to be sent to? [facility]   5. Do they need a 30 day or 90 day supply? 1 bottle    Pt made and appt with Eula Listen on 12/26/23 at 1:40 pm, also, pt has a new pharmacy [facility]

## 2023-12-12 NOTE — Telephone Encounter (Signed)
Called and spoke with patient. Notified her that she had a prescription for nitroglycerin sent to [facility] pharmacy on 12/08/23. Patient verbalizes understanding.

## 2023-12-14 NOTE — Telephone Encounter (Signed)
 Appointment scheduled for 12/26/23

## 2023-12-19 ENCOUNTER — Telehealth: Payer: Self-pay

## 2023-12-19 ENCOUNTER — Other Ambulatory Visit: Payer: Self-pay | Admitting: Family Medicine

## 2023-12-19 DIAGNOSIS — Z0000 Encounter for general adult medical examination without abnormal findings: Secondary | ICD-10-CM

## 2023-12-19 NOTE — Telephone Encounter (Signed)
Spoke with patient to advise that she is due for a mammogram and that on 2/3 we will have the mobile bus here on site. Provided the number to [facility] [phone] to get scheduled

## 2023-12-20 ENCOUNTER — Telehealth: Payer: Self-pay

## 2023-12-20 DIAGNOSIS — D649 Anemia, unspecified: Secondary | ICD-10-CM

## 2023-12-20 DIAGNOSIS — E785 Hyperlipidemia, unspecified: Secondary | ICD-10-CM

## 2023-12-20 NOTE — Telephone Encounter (Signed)
I put in the orders.  Please schedule.

## 2023-12-20 NOTE — Telephone Encounter (Signed)
Copied from CRM [id number]. Topic: Clinical - Request for Lab/Test Order >> Dec 20, 2023  1:46 PM Alona Bene A wrote: Reason for CRM: Patient called in and wanted to request lab work for Cardiologist appointment. Please place lab order for patient to schedule appointment to have blood work done.

## 2023-12-20 NOTE — Telephone Encounter (Signed)
Spoke to pt. Made fasting lab appt for tomorrow (12-20-23)

## 2023-12-20 NOTE — Telephone Encounter (Signed)
I called and spoke with patient and she said her cardiologist put her on Repatha and she needed to have fasting lab work before she goes back to see him on 12/26/23. Please advise

## 2023-12-21 ENCOUNTER — Other Ambulatory Visit (INDEPENDENT_AMBULATORY_CARE_PROVIDER_SITE_OTHER): Payer: No Typology Code available for payment source

## 2023-12-21 DIAGNOSIS — D649 Anemia, unspecified: Secondary | ICD-10-CM | POA: Diagnosis not present

## 2023-12-21 DIAGNOSIS — E785 Hyperlipidemia, unspecified: Secondary | ICD-10-CM

## 2023-12-21 LAB — COMPREHENSIVE METABOLIC PANEL
ALT: 35 U/L (ref 0–35)
AST: 43 U/L — ABNORMAL HIGH (ref 0–37)
Albumin: 4.3 g/dL (ref 3.5–5.2)
Alkaline Phosphatase: 138 U/L — ABNORMAL HIGH (ref 39–117)
BUN: 9 mg/dL (ref 6–23)
CO2: 26 meq/L (ref 19–32)
Calcium: 9.6 mg/dL (ref 8.4–10.5)
Chloride: 96 meq/L (ref 96–112)
Creatinine, Ser: 0.86 mg/dL (ref 0.40–1.20)
GFR: 70.48 mL/min (ref >60.00)
Glucose, Bld: 100 mg/dL — ABNORMAL HIGH (ref 70–99)
Potassium: 4.5 meq/L (ref 3.5–5.1)
Sodium: 132 meq/L — ABNORMAL LOW (ref 135–145)
Total Bilirubin: 1.8 mg/dL — ABNORMAL HIGH (ref 0.2–1.2)
Total Protein: 7.7 g/dL (ref 6.0–8.3)

## 2023-12-21 LAB — LIPID PANEL
Cholesterol: 137 mg/dL (ref 0–200)
HDL: 32.5 mg/dL — ABNORMAL LOW (ref >39.00)
LDL Cholesterol: 80 mg/dL (ref 0–99)
NonHDL: 104.41
Total CHOL/HDL Ratio: 4
Triglycerides: 120 mg/dL (ref 0.0–149.0)
VLDL: 24 mg/dL (ref 0.0–40.0)

## 2023-12-21 LAB — CBC WITH DIFFERENTIAL/PLATELET
Basophils Absolute: 0 10*3/uL (ref 0.0–0.1)
Basophils Relative: 0.6 % (ref 0.0–3.0)
Eosinophils Absolute: 0.1 10*3/uL (ref 0.0–0.7)
Eosinophils Relative: 1.9 % (ref 0.0–5.0)
HCT: 38.7 % (ref 36.0–46.0)
Hemoglobin: 13 g/dL (ref 12.0–15.0)
Lymphocytes Relative: 24.5 % (ref 12.0–46.0)
Lymphs Abs: 1.7 10*3/uL (ref 0.7–4.0)
MCHC: 33.6 g/dL (ref 30.0–36.0)
MCV: 90.8 fL (ref 78.0–100.0)
Monocytes Absolute: 0.4 10*3/uL (ref 0.1–1.0)
Monocytes Relative: 6.6 % (ref 3.0–12.0)
Neutro Abs: 4.5 10*3/uL (ref 1.4–7.7)
Neutrophils Relative %: 66.4 % (ref 43.0–77.0)
Platelets: 269 10*3/uL (ref 150.0–400.0)
RBC: 4.26 Mil/uL (ref 3.87–5.11)
RDW: 13 % (ref 11.5–15.5)
WBC: 6.8 10*3/uL (ref 4.0–10.5)

## 2023-12-21 LAB — VITAMIN B12: Vitamin B-12: 567 pg/mL (ref 211–911)

## 2023-12-21 LAB — FERRITIN: Ferritin: 31.4 ng/mL (ref 10.0–291.0)

## 2023-12-25 ENCOUNTER — Ambulatory Visit
Admission: RE | Admit: 2023-12-25 | Discharge: 2023-12-25 | Disposition: A | Discharge status: Home / Self Care | Payer: No Typology Code available for payment source | Source: Ambulatory Visit | Attending: Family Medicine | Admitting: Family Medicine

## 2023-12-25 DIAGNOSIS — Z0000 Encounter for general adult medical examination without abnormal findings: Secondary | ICD-10-CM

## 2023-12-25 DIAGNOSIS — Z1231 Encounter for screening mammogram for malignant neoplasm of breast: Secondary | ICD-10-CM | POA: Diagnosis not present

## 2023-12-25 NOTE — Progress Notes (Signed)
 Cardiology Office Note    Date:  12/26/2023   ID:  Andrea Webster, DOB February 09, 1956, MRN [id number]  PCP:  Cleatus Arlyss RAMAN, MD  Cardiologist:  Deatrice Cage, MD  Electrophysiologist:  None   Chief Complaint: Follow-up  History of Present Illness:   Andrea Webster is a 68 y.o. female with history of CAD with MI at age 37 status post PTCA to unknown vessel status post DES to the LCx in 2016 and 2020, HTN, HLD intolerant to high intensity statins as well as abnormal LFTs noted on atorvastatin , strong family history of CAD, prior CVA noted on imaging, iron  deficiency anemia, prior tobacco use until 01/2015, obesity, and GERD who presents for follow-up of CAD.   LHC in 04/2015 showed CTO of the RCA, severe mid LCx disease with 90% stenosis, and moderate proximal LAD disease.  FFR within the LAD was nonsignificant at 0.83.  The LCx was felt to be the culprit vessel and was treated successfully with PCI/DES.  Lexiscan  MPI in 10/2016 showed a small mild region of ischemia noted in the mid to apical lateral wall, unable to exclude attenuation artifact, and was overall low risk given very small mild perfusion defect.  Echo in 11/2016 demonstrated an EF of 60 to 65%, no regional wall motion abnormalities, grade 1 diastolic dysfunction, calcified mitral annulus, mildly dilated left atrium, and trivial pericardial effusion.  She was admitted to the hospital in 08/2019 with unstable angina.  Echo showed an EF of 60 to 65% with grade 1 diastolic dysfunction, mild aortic valve sclerosis without evidence of stenosis, and moderate pulmonary hypertension with a PASP of 45 mmHg.  LHC showed known CTO of the RCA, moderate proximal LAD disease, patent LCx stent with severe proximal stenosis and moderate distal disease.  She underwent successful PCI/DES to the proximal LCx.     Right upper quadrant ultrasound in 03/2022, performed for elevated LFTs, demonstrated increased echogenicity of the liver parenchyma possibly  representing hepatic steatosis versus other hepatocellular disease.  She was last seen in our office in 12/2022 and was without symptoms of angina or cardiac decompensation.  Blood pressure was mildly elevated in the office, though she reported blood pressure being well-controlled at home.  She remains very busy at baseline.  She did not have any acute cardiac concerns at that time outside of discussion of ongoing lipid management in the context of abnormal LFTs and myalgias associated with statins.  She was started on Repatha .  She was admitted to [facility] from 1/7 through 11/29/2023 with cardiopulmonary arrest after choking on a sandwich at dinner with failed abdominal thrust by fianc with subsequent unresponsiveness.  Patient was given CPR by family until fire department arrived with subsequent ROSC.  EMS was able to clear her airway obstruction.  She did not require shocks or ACLS medications.  High-sensitivity troponin peaked at 111.  Chest x-ray showed no acute cardiopulmonary disease.  CT head showed a 2.1 cm hypodense nonhemorrhagic infarct band in the right frontal deep matter felt to be most likely a chronic infarct or a late subacute infarct in the late stages of evolution as well as atrophy and small vessel disease, carotid artery atherosclerosis, and remaining incidental findings noted in CT report.  MRI of the brain showed no acute intracranial abnormality with findings suspicious for a tiny subdural hematoma felt to be subacute to chronic in nature.  There was also a 5 mm extra-axial nodular density overlying the anterior left frontal convexity felt to  possibly reflect a small meningioma without associated mass effect.  There was underlying age-related cerebral atrophy with mild chronic small vessel ischemic disease along with remote infarct involving the right basal ganglia/insula.  Echo showed an EF of 60 to 65%, no regional wall motion abnormalities, grade 1 diastolic dysfunction, normal RV  systolic function and ventricular cavity size, normal RVSP, moderate to severe mitral annular calcification with no evidence of mitral regurgitation, and an estimated right atrial pressure of 3 mmHg.  Mildly elevated high-sensitivity troponin was suspected to be secondary to demand ischemia from choking episode and his CPR.  She comes in doing very well from a cardiac perspective and is without symptoms of angina or cardiac decompensation.  Since undergoing the above admission she has significantly changed her lifestyle.  She is eating a heart healthy diet and drinking more water.  In this setting, her weight is down 24 pounds when compared to her visit in 12/2022.  Leading up to the above event, she was without symptoms of angina or cardiac decompensation.  Since the event, she remains without symptoms of angina or cardiac decompensation.  No dizziness, presyncope, or syncope.  Adherent and tolerating cardiac medications.  Overall feels well from a cardiac perspective and does not have any acute concerns at this time.   Labs independently reviewed: 11/2023 - potassium 4.5, BUN 9, serum creatinine 0.86, albumin 4.3, AST 43, ALT normal, Hgb 13.0, PLT 269, TC 137, TG 120, HDL 32, LDL 80 05/2019 - A1c 5.9 03/2015 - TSH normal  Past Medical History:  Diagnosis Date   Anxiety    Arthritis    Baker's cyst    x 2   CAD (coronary artery disease)    a. s/p MI & PTCA - age 89;  b. 04/2015 PCI: LM nl, LAD 60p (FFR 0.83), D1 60ost, LCX 50p, 29m (2.75x15 Xience Alpine DES), OM1/2/3 min irregs, RCA 158m CTO; c. 08/2019 PCI: LM nl, LAD mod prox dzs, LCX patent stent w/ severe prox dzs (DES), RCA CTO mid.   Chronic sciatica    Complication of anesthesia    Constipation    Depression    intolerant of paxil, wellbutrin, zoloft   Diastolic dysfunction    a. 08/2019 Echo: EF 60-65%, gr1 DD, PASP .   GERD (gastroesophageal reflux disease)    Hayfever    headaches    Heart disease    Heart murmur    Heart  valve disease    High cholesterol    Hypertension    Incisional hernia    Insomnia    Myocardial infarction (HCC) 08/2019   stented. first MI at age 50   Plantar fasciitis    Primary insomnia    Rectal polyp 03/2020   RLS (restless legs syndrome)    Sciatica    Sleep apnea    snores very loudly   Smoker    a. quit 01/2014.    Past Surgical History:  Procedure Laterality Date   ANGIOPLASTY  1997   APPENDECTOMY  1978   open   CARDIAC CATHETERIZATION     [facility]   CARDIAC CATHETERIZATION N/A 04/30/2015   Procedure: Left Heart Cath;  Surgeon: Deatrice DELENA Cage, MD;  Location: [facility] INVASIVE CV LAB;  Service: Cardiovascular;  Laterality: N/A;   CARPAL TUNNEL RELEASE     CESAREAN SECTION  1980   CESAREAN SECTION  1984   CHOLECYSTECTOMY N/A 03/30/2016   Procedure: LAPAROSCOPIC CHOLECYSTECTOMY WITH INTRAOPERATIVE CHOLANGIOGRAM;  Surgeon: Laneta FALCON  Pabon, MD;  Location: [facility] ORS;  Service: General;  Laterality: N/A;   COLONOSCOPY WITH PROPOFOL  N/A 06/26/2020   Procedure: COLONOSCOPY WITH PROPOFOL ;  Surgeon: Dessa Reyes ORN, MD;  Location: [facility] ENDOSCOPY;  Service: Endoscopy;  Laterality: N/A;   CORONARY ANGIOPLASTY     [facility]    [facility]   CORONARY STENT INTERVENTION N/A 09/05/2019   Procedure: CORONARY STENT INTERVENTION;  Surgeon: Ammon Blunt, MD;  Location: [facility];  Service: Cardiovascular;  Laterality: N/A;   ESOPHAGOGASTRODUODENOSCOPY (EGD) WITH PROPOFOL  N/A 06/26/2020   Procedure: ESOPHAGOGASTRODUODENOSCOPY (EGD) WITH PROPOFOL ;  Surgeon: Dessa Reyes ORN, MD;  Location: [facility] ENDOS[facility];  Service: Endoscopy;  Laterality: N/A;   EXCISION OF MESH N/A 04/29/2020   Procedure: EXCISION OF MESH;  Surgeon: Dessa Reyes ORN, MD;  Location: [facility] ORS;  Service: General;  Laterality: N/A;   HERNIA REPAIR  11/2016   [facility]; Dr. Norleen Pin   LEFT HEART CATH AND  CORONARY ANGIOGRAPHY N/A 09/05/2019   Procedure: LEFT HEART CATH AND CORONARY ANGIOGRAPHY;  Surgeon: Perla Evalene PARAS, MD;  Location: [facility];  Service: Cardiovascular;  Laterality: N/A;   VAGINAL HYSTERECTOMY     VENTRAL HERNIA REPAIR N/A 04/29/2020   Procedure: HERNIA REPAIR VENTRAL ADULT;  Surgeon: Dessa Reyes ORN, MD;  Location: [facility] ORS;  Service: General;  Laterality: N/A;  Ventral hernia repair with component separation- General anesthesia and exparel  block; Warren Raw, RNFA to assist    Current Medications: Current Meds  Medication Sig   ALPRAZolam  (XANAX ) 1 MG tablet Take 1 tablet (1 mg total) by mouth 3 (three) times daily as needed for anxiety.   aspirin  81 MG EC tablet Take 81 mg by mouth daily.    cetirizine (ZYRTEC) 10 MG chewable tablet Chew 10 mg by mouth daily.   cyclobenzaprine  (FLEXERIL ) 10 MG tablet Take 1 tablet (10 mg total) by mouth daily as needed for muscle spasms.   Evolocumab  (REPATHA ) 140 MG/ML SOSY Inject 140 mg into the skin every 14 (fourteen) days.   fluticasone  (FLONASE ) 50 MCG/ACT nasal spray USE 2 SPRAYS IN EACH NOSTRIL ONE TIME DAILY   fluticasone  (FLONASE ) 50 MCG/ACT nasal spray Place 1 spray into both nostrils in the morning and at bedtime.   isosorbide  mononitrate (IMDUR ) 60 MG 24 hr tablet Take 1 tablet (60 mg total) by mouth daily.   metoprolol  tartrate (LOPRESSOR ) 25 MG tablet Take 1 tablet (25 mg total) by mouth 2 (two) times daily.   Multiple Vitamin (MULTIVITAMIN WITH MINERALS) TABS tablet Take 1 tablet by mouth daily.   nitroGLYCERIN  (NITROSTAT ) 0.4 MG SL tablet Place 0.4 mg under the tongue every 5 (five) minutes as needed for chest pain.   pantoprazole  (PROTONIX ) 40 MG tablet TAKE 1 TABLET EVERY DAY   senna (SENOKOT) 8.6 MG tablet Take 1 tablet by mouth at bedtime. Takes at night   zolpidem  (AMBIEN ) 10 MG tablet Take 1 tablet (10 mg total) by mouth at bedtime as needed. 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    Comment: disabled  Tobacco Use   Smoking status: Former    Current packs/day: 0.00    Average packs/day: 0.3 packs/day for 20.0 years (6.6 ttl pk-yrs)    Types: Cigarettes    Start date: 02/13/1995    Quit date: 02/13/2015    Years since quitting: 8.8   Smokeless tobacco: Never  Vaping Use   Vaping status: Never Used  Substance and Sexual Activity   Alcohol use: Not Currently    Alcohol/week: 0.0 standard drinks of alcohol   Drug use: No   Sexual activity: Not on file  Other Topics Concern   Not on file  Social History Narrative   From [place], to [place] 2011   Widow as of 2001   1 son prev incarcerated, out as of 2019, in contact as of 2023   1 daughter   Lives with significant other, Tim   Social Drivers of Corporate Investment Banker Strain: Low Risk  (02/28/2023)   Overall Financial Resource Strain (CARDIA)    Difficulty of Paying Living Expenses: Not hard at all  Food Insecurity: No Food Insecurity (02/28/2023)   Hunger Vital Sign    Worried About Running Out of Food in the Last Year: Never true    Ran Out of Food in the Last Year: Never true  Transportation Needs: No Transportation Needs (02/28/2023)   PRAPARE - Administrator, Civil Service (Medical): No    Lack of Transportation (Non-Medical): No  Physical Activity: Sufficiently Active (02/28/2023)   Exercise Vital Sign    Days of Exercise per Week: 7 days    Minutes of Exercise  per Session: 30 min  Stress: No Stress Concern Present (02/28/2023)   [employer] of Occupational Health - Occupational Stress Questionnaire    Feeling of Stress : Not at all  Social Connections: Moderately Isolated (02/28/2023)   Social Connection and Isolation Panel [NHANES]    Frequency of Communication with Friends and Family: More than three times a week    Frequency of Social Gatherings with Friends and Family: Once a week    Attends Religious Services: More than 4 times per year    Active Member of [employer] or Organizations: No    Attends Engineer, Structural: Never    Marital Status: Never married     Family History:  The patient's family history includes Alcohol abuse in her father; Breast cancer (age of onset: 77) in her maternal grandmother; Coronary artery disease in her father and mother; Diabetes in her brother and mother; Heart attack in her father and mother; Heart disease in her father and mother; Hypertension in her brother and mother. There is no history of Colon cancer.  ROS:   12-point review of systems is negative unless otherwise noted in the HPI.   EKGs/Labs/Other Studies Reviewed:    Studies reviewed were summarized above. The additional studies were reviewed today:  2D echo 11/29/2023: 1. Left ventricular ejection fraction, by estimation, is 60 to 65%. The  left ventricle has normal function. The left ventricle has no regional  wall motion abnormalities. Left ventricular diastolic parameters are  consistent with Grade I diastolic  dysfunction (impaired relaxation).   2. Right ventricular systolic function is normal. The right ventricular  size is normal. There is normal pulmonary artery systolic pressure.   3. No evidence of mitral valve regurgitation. Moderate to  severe mitral  annular calcification.   4. The aortic valve was not well visualized. Aortic valve regurgitation  is not visualized.   5. The inferior vena cava is normal in size with  greater than 50%  respiratory variability, suggesting right atrial pressure of 3 mmHg.  __________  LHC 09/05/2019: Coronary dominance: Right/or codominant  Left mainstem:   Large vessel that bifurcates into the LAD and left circumflex, no significant disease noted  Left anterior descending (LAD):   Large vessel that extends to the apical region, diagonal branch 2 of moderate size, proximal LAD with 50% stenosis, unchanged from 2016  Left circumflex (LCx):  Large vessel with OM branch 2, patent stent in the mid to distal vessel 90% proximal left circumflex disease prior to takeoff of the OM1, progression from 60% stenosis in 2016 Distal left circumflex disease estimated 50-60%, beyond the mid to distal stent  Right coronary artery (RCA): Moderate sized right coronary artery, occluded in the mid vessel, collaterals from left to right Known to be occluded from prior catheterization 2016  Left ventriculography: Left ventricular systolic function is normal, LVEF is estimated at 55%, there is no significant mitral regurgitation , no significant aortic valve stenosis  Final Conclusions:   Occluded mid RCA, known from 2016 Severe stenosis of proximal left circumflex Moderate proximal LAD disease Moderate distal left circumflex disease, distal to the stent  Recommendations:  Case discussed with interventional cardiology We will plan for PCI of proximal left circumflex, likely culprit vessel Medical management of proximal LAD disease, distal left circumflex disease Strong recommendation for smoking cessation     PCI: Prox Cx lesion is 90% stenosed. Previously placed Mid Cx to Dist Cx stent (unknown type) is widely patent. A drug-eluting stent was successfully placed using a STENT RESOLUTE ONYX 2.25X12. Post intervention, there is a 0% residual stenosis.   1.  Vessel CAD with 90% stenosis proximal left circumflex 2.  Successful PCI with DES proximal left circumflex __________   2D  echo 09/05/2019: 1. Left ventricular ejection fraction, by visual estimation, is 60 to  65%. The left ventricle has normal function. Normal left ventricular size.  There is no left ventricular hypertrophy.   2. Left ventricular diastolic Doppler parameters are consistent with  impaired relaxation pattern of LV diastolic filling.   3. Global right ventricle has normal systolic function.The right  ventricular size is normal. No increase in right ventricular wall  thickness.   4. Left atrial size was mildly dilated.   5. Moderately elevated pulmonary artery systolic pressure. 45 mm Hg   6. Definity  contrast agent was given IV to delineate the left ventricular  endocardial borders. ___________   2D echo 12/01/2016: - Left ventricle: The cavity size was normal. Wall thickness was    increased in a pattern of mild LVH. Systolic function was normal.    The estimated ejection fraction was in the range of 60% to 65%.    Wall motion was normal; there were no regional wall motion    abnormalities. Doppler parameters are consistent with abnormal    left ventricular relaxation (grade 1 diastolic dysfunction).  - Mitral valve: Calcified annulus.  - Left atrium: The atrium was mildly dilated.  - Pericardium, extracardiac: A trivial pericardial effusion was    identified. __________   Lexiscan  MPI 11/17/2016: Pharmacological myocardial perfusion imaging study with small mild region of ischemia noted in the mid to apical lateral wall Unable to definatively exclude attenuation artifact given high uptake in the inferoseptal wall.  Normal wall motion, EF estimated at 83% No EKG changes concerning for ischemia at peak stress or in recovery. Low risk scan given very small mild perfusion defect. __________   LHC 04/30/2015: Mid RCA lesion, 100% stenosed. Prox Cx lesion, 50% stenosed. Prox LAD lesion, 60% stenosed. Ost 1st Diag to 1st Diag lesion, 60% stenosed. Mid Cx to Dist Cx lesion, 90% stenosed.  There is a 0% residual stenosis post intervention. A drug-eluting stent was placed.   1. Severe one-vessel coronary artery disease involving the mid left circumflex. Moderate proximal LAD stenosis not significant by FFR (0.83). Occluded small codominant mid RCA with left-to-right collaterals. 2. Normal LV systolic function by noninvasive testing. Mildly elevated left ventricular end-diastolic pressure. 3. Successful angioplasty and drug-eluting stent placement to the mid left circumflex.   Recommendations: Dual antiplatelets therapy for at least one year. Aggressive treatment of risk factors. __________   [place] MPI 04/22/2015: This is an intermediate risk study. Defect 1: There is a medium defect of moderate severity present in the basal inferolateral, mid inferolateral, apical inferior and apical lateral location. Findings consistent with ischemia. There was no ST segment deviation noted during stress. T wave inversion of 3 mm was noted during stress in the lateral leads (V5, V6 and V2), beginning at 0 minutes of stress, ending at 1 minutes of stress, and returning to baseline after 1-5 mins of recovery. asymmetric The left ventricular ejection fraction is normal (55-65%).   If Clinical Conditions warrant, would recommend Cardiac Catheterization. __________   2D echo 04/23/2013: - Left ventricle: The cavity size was normal. Wall thickness    was increased in a pattern of mild LVH. The estimated    ejection fraction was 60%. Wall motion was normal; there    were no regional wall motion abnormalities. Findings    consistent with left ventricular diastolic dysfunction.  - Aortic valve: Sclerosis without stenosis. Mild    regurgitation.  - Mitral valve: Mildly to moderately calcified annulus.  - Tricuspid valve: Mild regurgitation.  - Pulmonary arteries: PA peak pressure: 32mm Hg (S).   EKG:  EKG is ordered today.  The EKG ordered today demonstrates NSR with sinus arrhythmia, 69 bpm,  RBBB  Recent Labs: 12/21/2023: ALT 35; BUN 9; Creatinine, Ser 0.86; Hemoglobin 13.0; Platelets 269.0; Potassium 4.5; Sodium 132  Recent Lipid Panel    Component Value Date/Time   CHOL 137 12/21/2023 1125   CHOL 206 (H) 11/16/2016 1456   TRIG 120.0 12/21/2023 1125   HDL 32.50 (L) 12/21/2023 1125   HDL 54 11/16/2016 1456   CHOLHDL 4 12/21/2023 1125   VLDL 24.0 12/21/2023 1125   LDLCALC 80 12/21/2023 1125   LDLCALC 143 (H) 03/03/2023 1415    PHYSICAL EXAM:    VS:  BP 117/62 (BP Location: Left Arm, Patient Position: Sitting, Cuff Size: Normal)   Pulse 69   Ht 5' (1.524 m)   Wt 213 lb 3.2 oz (96.7 kg)   SpO2 96%   BMI 41.64 kg/m   BMI: Body mass index is 41.64 kg/m.  Physical Exam Constitutional:      Appearance: She is well-developed.  HENT:     Head: Normocephalic and atraumatic.  Eyes:     General:        Right eye: No discharge.        Left eye: No discharge.  Neck:     Vascular: No JVD.  Cardiovascular:     Rate and Rhythm: Normal rate and regular rhythm.  Heart sounds: S1 normal and S2 normal. Heart sounds not distant. No midsystolic click and no opening snap. Murmur heard.     Systolic murmur is present with a grade of 1/6 at the upper right sternal border.     No friction rub.  Pulmonary:     Effort: Pulmonary effort is normal. No respiratory distress.     Breath sounds: Normal breath sounds. No decreased breath sounds, wheezing, rhonchi or rales.  Chest:     Chest wall: No tenderness.  Abdominal:     General: There is no distension.  Musculoskeletal:     Cervical back: Normal range of motion.     Right lower leg: No edema.     Left lower leg: No edema.  Skin:    General: Skin is warm and dry.     Nails: There is no clubbing.  Neurological:     Mental Status: She is alert and oriented to person, place, and time.  Psychiatric:        Speech: Speech normal.        Behavior: Behavior normal.        Thought Content: Thought content normal.         Judgment: Judgment normal.     Wt Readings from Last 3 Encounters:  12/26/23 213 lb 3.2 oz (96.7 kg)  11/29/23 245 lb (111.1 kg)  03/23/23 239 lb (108.4 kg)     ASSESSMENT & PLAN:   CAD involving the native coronary arteries without angina: She is doing very well and without symptoms concerning for angina or cardiac decompensation.  Mildly elevated high-sensitivity troponin peaking at 111 felt to be reflective of supply/demand ischemia in the setting of respiratory arrest from witnessed choking episode.  Echo with preserved LV systolic function and normal wall motion.  Presentation not consistent with ACS.  In light of no anginal symptoms, we will defer further cardiac testing at this time.  Continue aggressive risk factor modification and secondary prevention including aspirin  81 mg, Imdur  60 mg, Lopressor  25 mg twice daily, and Repatha .  HTN: Blood pressure is well-controlled in the office today.  Remains on Imdur  and Lopressor .  HLD with statin intolerance: LDL 88 in 11/2023.  Continue with heart healthy diet.  Remains on Repatha .  Anticipate follow-up fasting labs at next visit.  History of remote CVA and abnormal MRI brain: Remains on aspirin  and Repatha  as outlined above.  Follow-up with PCP.  Aortic valve sclerosis: No evidence of stenosis on echo last month.     Disposition: F/u with Dr. Darron or an APP in 6 months.   Medication Adjustments/Labs and Tests Ordered: Current medicines are reviewed at length with the patient today.  Concerns regarding medicines are outlined above. Medication changes, Labs and Tests ordered today are summarized above and listed in the Patient Instructions accessible in Encounters.   Signed, Bernardino Bring, PA-C 12/26/2023 2:48 PM     [facility] [place] [address] [place], [place] [address] [phone]

## 2023-12-26 ENCOUNTER — Ambulatory Visit: Payer: No Typology Code available for payment source | Attending: Physician Assistant | Admitting: Physician Assistant

## 2023-12-26 ENCOUNTER — Encounter: Payer: Self-pay | Admitting: Physician Assistant

## 2023-12-26 VITALS — BP 117/62 | HR 69 | Ht 60.0 in | Wt 213.2 lb

## 2023-12-26 DIAGNOSIS — E785 Hyperlipidemia, unspecified: Secondary | ICD-10-CM

## 2023-12-26 DIAGNOSIS — I358 Other nonrheumatic aortic valve disorders: Secondary | ICD-10-CM | POA: Diagnosis not present

## 2023-12-26 DIAGNOSIS — I2510 Atherosclerotic heart disease of native coronary artery without angina pectoris: Secondary | ICD-10-CM | POA: Diagnosis not present

## 2023-12-26 DIAGNOSIS — I10 Essential (primary) hypertension: Secondary | ICD-10-CM | POA: Diagnosis not present

## 2023-12-26 DIAGNOSIS — Z8673 Personal history of transient ischemic attack (TIA), and cerebral infarction without residual deficits: Secondary | ICD-10-CM | POA: Diagnosis not present

## 2023-12-26 DIAGNOSIS — Z789 Other specified health status: Secondary | ICD-10-CM | POA: Diagnosis not present

## 2023-12-26 NOTE — Patient Instructions (Signed)
 Medication Instructions:  Your Physician recommend you continue on your current medication as directed.    *If you need a refill on your cardiac medications before your next appointment, please call your pharmacy*   Lab Work: None ordered at this time    Follow-Up: At [facility], you and your health needs are our priority.  As part of our continuing mission to provide you with exceptional heart care, we have created designated Provider Care Teams.  These Care Teams include your primary Cardiologist (physician) and Advanced Practice Providers (APPs -  Physician Assistants and Nurse Practitioners) who all work together to provide you with the care you need, when you need it.  We recommend signing up for the patient portal called "MyChart".  Sign up information is provided on this After Visit Summary.  MyChart is used to connect with patients for Virtual Visits (Telemedicine).  Patients are able to view lab/test results, encounter notes, upcoming appointments, etc.  Non-urgent messages can be sent to your provider as well.   To learn more about what you can do with MyChart, go to [email or web address].    Your next appointment:   6 month(s)  Provider:   You may see Lorine Bears, MD or one of the following Advanced Practice Providers on your designated Care Team:   Nicolasa Ducking, NP Eula Listen, PA-C Cadence Fransico Michael, PA-C Charlsie Quest, NP Carlos Levering, NP

## 2023-12-28 ENCOUNTER — Telehealth: Payer: Self-pay

## 2023-12-28 NOTE — Telephone Encounter (Signed)
 Copied from CRM [id number]. Topic: Clinical - Lab/Test Results >> Dec 28, 2023  1:27 PM Gerardine PARAS wrote: Reason for CRM: Patient called in regarding results for mammogram, stated it was completed last week in mobile bus. Patient advised a call back or message in MyChart would be okay to relay results

## 2023-12-28 NOTE — Telephone Encounter (Signed)
 MPRESSION: No mammographic evidence of malignancy. A result letter of this screening mammogram will be mailed directly to the patient.   RECOMMENDATION: Screening mammogram in one year. (Code:[id number])   BI-RADS CATEGORY  1: Negative.   Result note addressed.  Thanks.

## 2024-02-19 ENCOUNTER — Telehealth: Payer: Self-pay | Admitting: Cardiovascular Disease

## 2024-02-19 NOTE — Telephone Encounter (Signed)
 Pt c/o medication issue:  1. Name of Medication: Evolocumab (REPATHA) 140 MG/ML SOSY   2. How are you currently taking this medication (dosage and times per day)? Inject 140 mg into the skin every 14 (fourteen) days.   3. Are you having a reaction (difficulty breathing--STAT)? No  4. What is your medication issue? Patient is requesting a refill for the medication. Patient stated she switch from [facility] insurance to [employer]. Patient stated [employer] would not cover the medication when she went to pick up the medication. Please advise.

## 2024-02-20 ENCOUNTER — Other Ambulatory Visit (HOSPITAL_COMMUNITY): Payer: Self-pay

## 2024-02-20 ENCOUNTER — Telehealth: Payer: Self-pay | Admitting: Pharmacy Technician

## 2024-02-20 MED ORDER — REPATHA SURECLICK 140 MG/ML ~~LOC~~ SOAJ: 140.0000 mg | SUBCUTANEOUS | 3 refills | Status: DC

## 2024-02-20 NOTE — Telephone Encounter (Signed)
 Pharmacy Patient Advocate Encounter   Received notification from Pt Calls Messages that prior authorization for Repatha is required/requested.   Insurance verification completed.   The patient is insured through [employer] .   Per test claim: PA required; PA submitted to above mentioned insurance via CoverMyMeds Key/confirmation #/EOC [id number] Status is pending

## 2024-02-20 NOTE — Addendum Note (Signed)
 Addended by: Tylene Fantasia on: 02/20/2024 10:06 AM   Modules accepted: Orders

## 2024-02-20 NOTE — Telephone Encounter (Signed)
 Pharmacy Patient Advocate Encounter  Received notification from [employer] that Prior Authorization for Repatha has been APPROVED from 11/22/23 to 02/19/25. Ran test claim, Copay is $0.00- one month. This test claim was processed through [facility]- copay amounts may vary at other pharmacies due to pharmacy/plan contracts, or as the patient moves through the different stages of their insurance plan.   PA #/Case ID/Reference #: [id number]

## 2024-02-22 ENCOUNTER — Telehealth: Payer: Self-pay | Admitting: Cardiovascular Disease

## 2024-02-22 ENCOUNTER — Other Ambulatory Visit: Payer: Self-pay | Admitting: Family Medicine

## 2024-02-22 NOTE — Telephone Encounter (Signed)
*  STAT* If patient is at the pharmacy, call can be transferred to refill team.   1. Which medications need to be refilled? (please list name of each medication and dose if known)   Evolocumab (REPATHA SURECLICK) 140 MG/ML SOAJ    DIFFERENT PHARMACY REQUESTED   4. Which pharmacy/location (including street and city if local pharmacy) is medication to be sent to?  [id number] [place]ann Sheen, [place] - [address] Phone: [phone]  Fax: [phone]       5. Do they need a 30 day or 90 day supply? 90

## 2024-02-23 NOTE — Telephone Encounter (Signed)
 Rx sent on 4/1

## 2024-02-26 ENCOUNTER — Other Ambulatory Visit (HOSPITAL_COMMUNITY): Payer: Self-pay

## 2024-02-28 MED ORDER — REPATHA SURECLICK 140 MG/ML ~~LOC~~ SOAJ: 140.0000 mg | SUBCUTANEOUS | 3 refills | Status: DC

## 2024-02-28 NOTE — Addendum Note (Signed)
 Addended by: Malena Peer D on: 02/28/2024 07:26 AM   Modules accepted: Orders

## 2024-03-01 ENCOUNTER — Other Ambulatory Visit: Payer: Self-pay | Admitting: Pharmacist Clinician (PhC)/ Clinical Pharmacy Specialist

## 2024-03-01 MED ORDER — REPATHA SURECLICK 140 MG/ML ~~LOC~~ SOAJ: 140.0000 mg | SUBCUTANEOUS | 3 refills | Status: DC

## 2024-03-26 ENCOUNTER — Ambulatory Visit (INDEPENDENT_AMBULATORY_CARE_PROVIDER_SITE_OTHER): Admitting: Family Medicine

## 2024-03-26 ENCOUNTER — Encounter: Payer: Self-pay | Admitting: Family Medicine

## 2024-03-26 VITALS — BP 118/72 | HR 65 | Temp 98.1°F | Ht 58.78 in | Wt 188.0 lb

## 2024-03-26 DIAGNOSIS — Z0000 Encounter for general adult medical examination without abnormal findings: Secondary | ICD-10-CM

## 2024-03-26 DIAGNOSIS — E785 Hyperlipidemia, unspecified: Secondary | ICD-10-CM

## 2024-03-26 DIAGNOSIS — I2510 Atherosclerotic heart disease of native coronary artery without angina pectoris: Secondary | ICD-10-CM | POA: Diagnosis not present

## 2024-03-26 DIAGNOSIS — Z7189 Other specified counseling: Secondary | ICD-10-CM

## 2024-03-26 DIAGNOSIS — Z1211 Encounter for screening for malignant neoplasm of colon: Secondary | ICD-10-CM

## 2024-03-26 DIAGNOSIS — F419 Anxiety disorder, unspecified: Secondary | ICD-10-CM | POA: Diagnosis not present

## 2024-03-26 DIAGNOSIS — J069 Acute upper respiratory infection, unspecified: Secondary | ICD-10-CM

## 2024-03-26 DIAGNOSIS — Z780 Asymptomatic menopausal state: Secondary | ICD-10-CM | POA: Diagnosis not present

## 2024-03-26 DIAGNOSIS — G4700 Insomnia, unspecified: Secondary | ICD-10-CM

## 2024-03-26 LAB — COMPREHENSIVE METABOLIC PANEL WITH GFR
ALT: 25 U/L (ref 0–35)
AST: 34 U/L (ref 0–37)
Albumin: 4.2 g/dL (ref 3.5–5.2)
Alkaline Phosphatase: 130 U/L — ABNORMAL HIGH (ref 39–117)
BUN: 7 mg/dL (ref 6–23)
CO2: 29 meq/L (ref 19–32)
Calcium: 9.2 mg/dL (ref 8.4–10.5)
Chloride: 101 meq/L (ref 96–112)
Creatinine, Ser: 0.81 mg/dL (ref 0.40–1.20)
GFR: 75.59 mL/min (ref >60.00)
Glucose, Bld: 105 mg/dL — ABNORMAL HIGH (ref 70–99)
Potassium: 3.8 meq/L (ref 3.5–5.1)
Sodium: 137 meq/L (ref 135–145)
Total Bilirubin: 0.7 mg/dL (ref 0.2–1.2)
Total Protein: 7.5 g/dL (ref 6.0–8.3)

## 2024-03-26 LAB — LIPID PANEL
Cholesterol: 124 mg/dL (ref 0–200)
HDL: 35.5 mg/dL — ABNORMAL LOW (ref >39.00)
LDL Cholesterol: 70 mg/dL (ref 0–99)
NonHDL: 88.02
Total CHOL/HDL Ratio: 3
Triglycerides: 92 mg/dL (ref 0.0–149.0)
VLDL: 18.4 mg/dL (ref 0.0–40.0)

## 2024-03-26 LAB — CBC WITH DIFFERENTIAL/PLATELET
Basophils Absolute: 0 10*3/uL (ref 0.0–0.1)
Basophils Relative: 0.3 % (ref 0.0–3.0)
Eosinophils Absolute: 0.1 10*3/uL (ref 0.0–0.7)
Eosinophils Relative: 1.5 % (ref 0.0–5.0)
HCT: 39.3 % (ref 36.0–46.0)
Hemoglobin: 13.2 g/dL (ref 12.0–15.0)
Lymphocytes Relative: 24.4 % (ref 12.0–46.0)
Lymphs Abs: 1.6 10*3/uL (ref 0.7–4.0)
MCHC: 33.7 g/dL (ref 30.0–36.0)
MCV: 85.3 fl (ref 78.0–100.0)
Monocytes Absolute: 0.4 10*3/uL (ref 0.1–1.0)
Monocytes Relative: 5.7 % (ref 3.0–12.0)
Neutro Abs: 4.5 10*3/uL (ref 1.4–7.7)
Neutrophils Relative %: 68.1 % (ref 43.0–77.0)
Platelets: 212 10*3/uL (ref 150.0–400.0)
RBC: 4.6 Mil/uL (ref 3.87–5.11)
RDW: 13.1 % (ref 11.5–15.5)
WBC: 6.6 10*3/uL (ref 4.0–10.5)

## 2024-03-26 NOTE — Patient Instructions (Addendum)
 You can call for a bone density at [facility].  [address] [place] [phone]  Go to the lab on the way out.   If you have mychart we'll likely use that to update you.    Take care.  Glad to see you.  Pneumonia 20, tetanus, shingles shots when possible.  Likely cheaper at the pharmacy.

## 2024-03-26 NOTE — Progress Notes (Unsigned)
 Recent illness with rhinorrhea, brother has similar.  She is improving.   Elevated Cholesterol: Using medications without problems: yes Muscle aches: no Diet compliance: yes Exercise: yes  CAD.  Using medication without problems or lightheadedness: yes Chest pain with exertion:no Edema:no Short of breath:no  Insomnia.  Taking ambien .  No ADE on med.  It helped with sleep.  Compliant.   Anxiety.  Taking xanx prn.  Intolerant of mult meds. Mood improved with med.  No ADE on med.  No SI/HI.    Vaccines d/w pt.   Colonoscopy 2021, referred 2025.   Mammogram 2025 Tetanus 2014 PNA d/w pt.  Defer 2025 given recent illness.   Covid vaccine prev done.  Flu 2024 Shingrix d/w pt.   DXA d/w pt.  Ordered.  D/w pt about calling to schedule.   Advance directive.  Timothy Goins designated if patient were incapacitated  Weight loss noted, intentional weight loss.  She is drinking more water.  She cut back on calories.    No more episodes of choking.    Meds, vitals, and allergies reviewed.   ROS: Per HPI unless specifically indicated in ROS section   GEN: nad, alert and oriented HEENT: ncat, TM WNL B, nasal exam slightly stuffy OP wnl.  NECK: supple w/o LA CV: rrr PULM: ctab, no inc wob ABD: soft, +bs EXT: no edema SKIN: Well-perfused.

## 2024-03-27 DIAGNOSIS — J069 Acute upper respiratory infection, unspecified: Secondary | ICD-10-CM | POA: Insufficient documentation

## 2024-03-27 NOTE — Assessment & Plan Note (Signed)
 Taking xanx prn.  Intolerant of mult meds. Mood improved with med.  No ADE on med.  No SI/HI.   Continue as is.

## 2024-03-27 NOTE — Assessment & Plan Note (Signed)
Continue Repatha.

## 2024-03-27 NOTE — Assessment & Plan Note (Signed)
 Should resolve.  Benign exam overall.  Okay for outpatient follow-up.  Supportive care.

## 2024-03-27 NOTE — Assessment & Plan Note (Signed)
 Colonoscopy 2021, referred 2025.   Mammogram 2025 Tetanus 2014 PNA d/w pt.  Defer 2025 given recent illness.   Covid vaccine prev done.  Flu 2024 Shingrix d/w pt.   DXA d/w pt.  Ordered.  D/w pt about calling to schedule.   Advance directive.  Timothy Goins designated if patient were incapacitated

## 2024-03-27 NOTE — Assessment & Plan Note (Signed)
 Advance directive.  Andrea Webster designated if patient were incapacitated

## 2024-03-27 NOTE — Assessment & Plan Note (Signed)
 Continue Ambien  use.

## 2024-03-27 NOTE — Assessment & Plan Note (Signed)
 Not having chest pain.  Okay for outpatient follow-up.  Continue aspirin  Repatha  isosorbide  metoprolol .

## 2024-03-28 ENCOUNTER — Encounter: Payer: Self-pay | Admitting: Family Medicine

## 2024-04-19 ENCOUNTER — Other Ambulatory Visit: Payer: Self-pay | Admitting: Family Medicine

## 2024-04-22 NOTE — Telephone Encounter (Signed)
 LOV 03/26/24  Last refill 12/11/23 #90 W/ 1 REFILL  NOV nothing scheduled

## 2024-05-03 ENCOUNTER — Ambulatory Visit (INDEPENDENT_AMBULATORY_CARE_PROVIDER_SITE_OTHER)

## 2024-05-03 VITALS — Ht 60.0 in | Wt 178.0 lb

## 2024-05-03 DIAGNOSIS — Z0000 Encounter for general adult medical examination without abnormal findings: Secondary | ICD-10-CM

## 2024-05-03 DIAGNOSIS — Z13820 Encounter for screening for osteoporosis: Secondary | ICD-10-CM | POA: Diagnosis not present

## 2024-05-03 DIAGNOSIS — Z1211 Encounter for screening for malignant neoplasm of colon: Secondary | ICD-10-CM

## 2024-05-03 DIAGNOSIS — Z1231 Encounter for screening mammogram for malignant neoplasm of breast: Secondary | ICD-10-CM

## 2024-05-03 NOTE — Progress Notes (Signed)
 Subjective:   Andrea Webster is a 68 y.o. who presents for a Medicare Wellness preventive visit.  As a reminder, Annual Wellness Visits don't include a physical exam, and some assessments may be limited, especially if this visit is performed virtually. We may recommend an in-person follow-up visit with your provider if needed.  Visit Complete: Virtual I connected with  Andrea Webster on 05/03/24 by a audio enabled telemedicine application and verified that I am speaking with the correct person using two identifiers.  Patient Location: Home  Provider Location: Office/Clinic  I discussed the limitations of evaluation and management by telemedicine. The patient expressed understanding and agreed to proceed.  Vital Signs: Because this visit was a virtual/telehealth visit, some criteria may be missing or patient reported. Any vitals not documented were not able to be obtained and vitals that have been documented are patient reported.  VideoDeclined- This patient declined Librarian, academic. Therefore the visit was completed with audio only.  Persons Participating in Visit: Patient.  AWV Questionnaire: No: Patient Medicare AWV questionnaire was not completed prior to this visit.  Cardiac Risk Factors include: advanced age (>36men, >60 women);dyslipidemia;hypertension;obesity (BMI >30kg/m2)     Objective:    Today's Vitals   05/03/24 0900  Weight: 178 lb (80.7 kg)  Height: 5' (1.524 m)   Body mass index is 34.76 kg/m.     05/03/2024    9:11 AM 02/28/2023   11:42 AM 01/25/2022   11:18 AM 06/26/2020    6:52 AM 04/29/2020   11:06 AM 04/23/2020    4:14 PM 09/05/2019    1:09 PM  Advanced Directives  Does Patient Have a Medical Advance Directive? No Yes No No No No No  Type of Furniture conservator/restorer;Living will       Copy of Healthcare Power of Attorney in Chart?  No - copy requested       Would patient like information on creating a  medical advance directive?   Yes (MAU/Ambulatory/Procedural Areas - Information given)  No - Patient declined Yes (MAU/Ambulatory/Procedural Areas - Information given) No - Patient declined    Current Medications (verified) Outpatient Encounter Medications as of 05/03/2024  Medication Sig   albuterol  (VENTOLIN  HFA) 108 (90 Base) MCG/ACT inhaler INHALE 2 PUFFS EVERY 6 HOURS AS NEEDED FOR WHEEZING OR SHORTNESS OF BREATH   ALPRAZolam  (XANAX ) 1 MG tablet TAKE 1 TABLET 3 TIMES A DAYAS NEEDED FOR ANXIETY   aspirin  81 MG EC tablet Take 81 mg by mouth daily.    cetirizine (ZYRTEC) 10 MG chewable tablet Chew 10 mg by mouth daily.   cyclobenzaprine  (FLEXERIL ) 10 MG tablet TAKE 1 TABLET DAILY AS     NEEDED FOR MUSCLE SPASMS   Evolocumab  (REPATHA  SURECLICK) 140 MG/ML SOAJ Inject 140 mg into the skin every 14 (fourteen) days.   fluticasone  (FLONASE ) 50 MCG/ACT nasal spray Place 1 spray into both nostrils in the morning and at bedtime.   isosorbide  mononitrate (IMDUR ) 60 MG 24 hr tablet Take 1 tablet (60 mg total) by mouth daily.   ketotifen  (ZADITOR ) 0.025 % ophthalmic solution Place 1 drop into both eyes 2 (two) times daily.   metoprolol  tartrate (LOPRESSOR ) 25 MG tablet Take 1 tablet (25 mg total) by mouth 2 (two) times daily.   Multiple Vitamin (MULTIVITAMIN WITH MINERALS) TABS tablet Take 1 tablet by mouth daily.   nitroGLYCERIN  (NITROSTAT ) 0.4 MG SL tablet Place 0.4 mg under the tongue every 5 (five) minutes as  needed for chest pain.   pantoprazole  (PROTONIX ) 40 MG tablet TAKE 1 TABLET EVERY DAY   senna (SENOKOT) 8.6 MG tablet Take 1 tablet by mouth at bedtime. Takes at night   zolpidem  (AMBIEN ) 10 MG tablet TAKE 1 TABLET AT BEDTIME ASNEEDED FOR SLEEP   No facility-administered encounter medications on file as of 05/03/2024.    Allergies (verified) Atorvastatin , Effexor  [venlafaxine ], Paxil [paroxetine], Rosuvastatin , Sertraline, Wellbutrin [bupropion], and Tape   History: Past Medical History:   Diagnosis Date   Anxiety    Arthritis    Baker's cyst    x 2   CAD (coronary artery disease)    a. s/p MI & PTCA - age 55;  b. 04/2015 PCI: LM nl, LAD 60p (FFR 0.83), D1 60ost, LCX 50p, 80m (2.75x15 Xience Alpine DES), OM1/2/3 min irregs, RCA 192m CTO; c. 08/2019 PCI: LM nl, LAD mod prox dzs, LCX patent stent w/ severe prox dzs (DES), RCA CTO mid.   Chronic sciatica    Complication of anesthesia    Constipation    Depression    intolerant of paxil, wellbutrin, zoloft   Diastolic dysfunction    a. 08/2019 Echo: EF 60-65%, gr1 DD, PASP .   GERD (gastroesophageal reflux disease)    Hayfever    headaches    Heart disease    Heart murmur    Heart valve disease    High cholesterol    Hypertension    Incisional hernia    Insomnia    Myocardial infarction (HCC) 08/2019   stented. first MI at age 76   Plantar fasciitis    Primary insomnia    Rectal polyp 03/2020   RLS (restless legs syndrome)    Sciatica    Sleep apnea    snores very loudly   Smoker    a. quit 01/2014.   Past Surgical History:  Procedure Laterality Date   ANGIOPLASTY  1997   APPENDECTOMY  1978   open   CARDIAC CATHETERIZATION     [facility]   CARDIAC CATHETERIZATION N/A 04/30/2015   Procedure: Left Heart Cath;  Surgeon: Wenona Hamilton, MD;  Location: [facility] INVASIVE CV LAB;  Service: Cardiovascular;  Laterality: N/A;   CARPAL TUNNEL RELEASE     CESAREAN SECTION  1980   CESAREAN SECTION  1984   CHOLECYSTECTOMY N/A 03/30/2016   Procedure: LAPAROSCOPIC CHOLECYSTECTOMY WITH INTRAOPERATIVE CHOLANGIOGRAM;  Surgeon: Alben Alma, MD;  Location: [facility] ORS;  Service: General;  Laterality: N/A;   COLONOSCOPY WITH PROPOFOL  N/A 06/26/2020   Procedure: COLONOSCOPY WITH PROPOFOL ;  Surgeon: Marshall Skeeter, MD;  Location: [facility] ENDOSCOPY;  Service: Endoscopy;  Laterality: N/A;   CORONARY ANGIOPLASTY     [facility]    [facility]ONARY AN[facility]LAST[facility] WITH ST[facility]   CORONARY STENT INTERVENTION N/A 09/05/2019   Procedure: CORONARY STENT INTERVENTION;  Surgeon: Percival Brace, MD;  Location: [facility];  Service: Cardiovascular;  Laterality: N/A;   ESOPHAGOGASTRODUODENOSCOPY (EGD) WITH PROPOFOL  N/A 06/26/2020   Procedure: ESOPHAGOGASTRODUODENOSCOPY (EGD) WITH PROPOFOL ;  Surgeon: Marshall Skeeter, MD;  Location: [facility] ENDOS[facility];  Service: Endoscopy;  Laterality: N/A;   EXCISION OF MESH N/A 04/29/2020   Procedure: EXCISION OF MESH;  Surgeon: Marshall Skeeter, MD;  Location: [facility] ORS;  Service: General;  Laterality: N/A;   HERNIA REPAIR  11/2016   [facility]; Dr. Tommy Frames   LEFT HEART CATH AND CORONARY ANGIOGRAPHY N/A 09/05/2019  Procedure: LEFT HEART CATH AND CORONARY ANGIOGRAPHY;  Surgeon: Devorah Fonder, MD;  Location: [facility];  Service: Cardiovascular;  Laterality: N/A;   VAGINAL HYSTERECTOMY     VENTRAL HERNIA REPAIR N/A 04/29/2020   Procedure: HERNIA REPAIR VENTRAL ADULT;  Surgeon: Marshall Skeeter, MD;  Location: [facility] ORS;  Service: General;  Laterality: N/A;  Ventral hernia repair with component separation- General anesthesia and exparel  block; Queen Bruins, RNFA to assist   Family History  Problem Relation Age of Onset   Heart disease Mother    Diabetes Mother    Hypertension Mother    Heart attack Mother    Coronary artery disease Mother    Alcohol abuse Father    Heart disease Father    Heart attack Father    Coronary artery disease Father    Diabetes Brother    Hypertension Brother    Breast cancer Maternal Grandmother 76   Colon cancer Neg Hx    Social History   Socioeconomic History   Marital status: Significant Other    Spouse name: TIM   Number of children: Not on file   Years of education: Not on file   Highest education level: Not on file  Occupational History   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, 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    Comment: disabled  Tobacco Use   Smoking status: Former     Current packs/day: 0.00    Average packs/day: 0.3 packs/day for 20.0 years (6.6 ttl pk-yrs)    Types: Cigarettes    Start date: 02/13/1995    Quit date: 02/13/2015    Years since quitting: 9.2   Smokeless tobacco: Never  Vaping Use   Vaping status: Never Used  Substance and Sexual Activity   Alcohol use: Not Currently    Alcohol/week: 0.0 standard drinks of alcohol   Drug use: No   Sexual activity: Not on file  Other Topics Concern   Not on file  Social History Narrative   From [place], to [place] 2011   Widow as of 2001   1 son prev incarcerated, out as of 2019, in contact as of 2023   1 daughter   Lives with significant other, Tim   Social Drivers of Corporate investment banker Strain: Low Risk  (05/03/2024)   Overall Financial Resource Strain (CARDIA)    Difficulty of Paying Living Expenses: Not hard at all  Food Insecurity: No Food Insecurity (05/03/2024)   Hunger Vital Sign    Worried About Running Out of Food in the Last Year: Never true    Ran Out of Food in the Last Year: Never true  Transportation Needs: No Transportation Needs (05/03/2024)   PRAPARE - Administrator, Civil Service (Medical): No    Lack of Transportation (Non-Medical): No  Physical Activity: Sufficiently Active (05/03/2024)   Exercise Vital Sign    Days of Exercise per Week: 5 days    Minutes of Exercise per Session: 30 min  Stress: No Stress Concern Present (02/28/2023)   [employer] of Occupational Health - Occupational Stress Questionnaire    Feeling of Stress : Not at all  Social Connections: Unknown (05/03/2024)   Social Connection and Isolation Panel    Frequency of Communication with Friends and Family: More than three times a week    Frequency of Social Gatherings with Friends and Family: More than three times a week    Attends Religious Services: More than 4 times per year  Active Member of Clubs or Organizations: No    Attends Banker Meetings: Never     Marital Status: Not on file    Tobacco Counseling Counseling given: Not Answered    Clinical Intake:  Pre-visit preparation completed: Yes  Pain : No/denies pain     BMI - recorded: 34.76 Nutritional Status: BMI > 30  Obese Nutritional Risks: None Diabetes: No  Lab Results  Component Value Date   HGBA1C 5.9 06/19/2019   HGBA1C 5.9 02/15/2018     How often do you need to have someone help you when you read instructions, pamphlets, or other written materials from your doctor or pharmacy?: 1 - Never  Interpreter Needed?: No  Comments: fiance and brother lives with pt Information entered by :: B.Doneisha Ivey,LPN   Activities of Daily Living     05/03/2024    9:11 AM  In your present state of health, do you have any difficulty performing the following activities:  Hearing? 0  Vision? 0  Difficulty concentrating or making decisions? 0  Walking or climbing stairs? 0  Dressing or bathing? 0  Doing errands, shopping? 0  Preparing Food and eating ? N  Using the Toilet? N  In the past six months, have you accidently leaked urine? N  Do you have problems with loss of bowel control? N  Managing your Medications? N  Managing your Finances? N  Housekeeping or managing your Housekeeping? N    Patient Care Team: Donnie Galea, MD as PCP - General (Family Medicine) Wenona Hamilton, MD as PCP - Cardiology (Cardiology) Wenona Hamilton, MD as Consulting Physician (Cardiology) Baldomero Levans, MD as Referring Physician (Bariatrics) Vallarie Gauze Gwynda Leriche, MD (Family Medicine)  I have updated your Care Teams any recent Medical Services you may have received from other providers in the past year.     Assessment:   This is a routine wellness examination for [place].  Hearing/Vision screen Hearing Screening - Comments:: Pt says her hearing is good Vision Screening - Comments:: Pt says her vision is good;thinks her glass rx needs a little changing:making an appt soon Dr  Austin Leff    Goals Addressed             This Visit's Progress    COMPLETED: Increase physical activity       Starting 01/23/17, I will continue to walk for 30 min daily.      Increase physical activity   On track    Starting 02/15/2018, I will continue to take medications as prescribed and to keep appointments with PCP with scheduled.      Patient Stated   On track    Would like to continue walking routine     Weight (lb) < 200 lb (90.7 kg)   178 lb (80.7 kg)    Pt says she wants to more weight       Depression Screen     05/03/2024    9:08 AM 03/26/2024    9:29 AM 03/23/2023    2:36 PM 02/28/2023   11:43 AM 01/25/2022   11:20 AM 02/15/2018    1:16 PM 01/23/2017    1:47 PM  PHQ 2/9 Scores  PHQ - 2 Score 0 0 0 0 0 3 0  PHQ- 9 Score  0 2   5     Fall Risk     05/03/2024    9:04 AM 03/26/2024    9:28 AM 03/23/2023    2:36 PM 02/28/2023  11:44 AM 01/25/2022   11:19 AM  Fall Risk   Falls in the past year? 0 1 0 0 0  Number falls in past yr: 0 0 0 0 0  Injury with Fall? 0 1 0 0 0  Risk for fall due to : No Fall Risks No Fall Risks No Fall Risks Impaired vision No Fall Risks  Follow up Education provided;Falls prevention discussed Falls evaluation completed Falls evaluation completed Falls prevention discussed Falls prevention discussed      Data saved with a previous flowsheet row definition    MEDICARE RISK AT HOME:  Medicare Risk at Home Any stairs in or around the home?: Yes If so, are there any without handrails?: Yes Home free of loose throw rugs in walkways, pet beds, electrical cords, etc?: Yes Adequate lighting in your home to reduce risk of falls?: Yes Life alert?: No Use of a cane, walker or w/c?: Yes (occassionally) Grab bars in the bathroom?: No Shower chair or bench in shower?: Yes Elevated toilet seat or a handicapped toilet?: No  TIMED UP AND GO:  Was the test performed?  No  Cognitive Function: 6CIT completed    02/15/2018    1:35 PM 01/23/2017    1:47 PM   MMSE - Mini Mental State Exam  Orientation to time 5 5   Orientation to Place 5 5   Registration 3 3   Attention/ Calculation 0 0   Recall 3 3   Language- name 2 objects 0 0   Language- repeat 1 1  Language- follow 3 step command 3 3   Language- read & follow direction 0 0   Write a sentence 0 0   Copy design 0 0   Total score 20 20      Data saved with a previous flowsheet row definition        05/03/2024    9:16 AM 02/28/2023   11:44 AM  6CIT Screen  What Year? 0 points 0 points  What month? 0 points 0 points  What time? 0 points 0 points  Count back from 20 0 points 0 points  Months in reverse 0 points 2 points  Repeat phrase 0 points 0 points  Total Score 0 points 2 points    Immunizations Immunization History  Administered Date(s) Administered   Fluad Trivalent(High Dose 65+) 09/11/2023   Influenza Split 11/01/2011   Influenza, Seasonal, Injecte, Preservative Fre 11/23/2012   Influenza,inj,Quad PF,6+ Mos 08/29/2013, 10/03/2014, 08/26/2015, 08/08/2016, 09/05/2017, 08/30/2018, 08/13/2019, 07/30/2020, 09/17/2021, 09/16/2022   Influenza-Unspecified 08/01/2016   PFIZER(Purple Top)SARS-COV-2 Vaccination 05/16/2020, 06/06/2020   Pneumococcal Polysaccharide-23 08/26/2015   Tdap 04/11/2013    Screening Tests Health Maintenance  Topic Date Due   Zoster Vaccines- Shingrix (1 of 2) Never done   Pneumococcal Vaccine: 50+ Years (2 of 2 - PCV) 08/25/2016   DEXA SCAN  Never done   DTaP/Tdap/Td (2 - Td or Tdap) 04/12/2023   Colonoscopy  06/27/2023   INFLUENZA VACCINE  06/21/2024   Medicare Annual Wellness (AWV)  05/03/2025   MAMMOGRAM  12/24/2025   Hepatitis C Screening  Completed   HPV VACCINES  Aged Out   Meningococcal B Vaccine  Aged Out   COVID-19 Vaccine  Discontinued    Health Maintenance  Health Maintenance Due  Topic Date Due   Zoster Vaccines- Shingrix (1 of 2) Never done   Pneumococcal Vaccine: 50+ Years (2 of 2 - PCV) 08/25/2016   DEXA SCAN  Never done    DTaP/Tdap/Td (2 -  Td or Tdap) 04/12/2023   Colonoscopy  06/27/2023   Health Maintenance Items Addressed: Mammogram ordered, Referral sent to GI for colonoscopy  Additional Screening:  Vision Screening: Recommended annual ophthalmology exams for early detection of glaucoma and other disorders of the eye. Would you like a referral to an eye doctor? No    Dental Screening: Recommended annual dental exams for proper oral hygiene  Community Resource Referral / Chronic Care Management: CRR required this visit?  No   CCM required this visit?  No   Plan:    I have personally reviewed and noted the following in the patient's chart:   Medical and social history Use of alcohol, tobacco or illicit drugs  Current medications and supplements including opioid prescriptions. Patient is not currently taking opioid prescriptions. Functional ability and status Nutritional status Physical activity Advanced directives List of other physicians Hospitalizations, surgeries, and ER visits in previous 12 months Vitals Screenings to include cognitive, depression, and falls Referrals and appointments  In addition, I have reviewed and discussed with patient certain preventive protocols, quality metrics, and best practice recommendations. A written personalized care plan for preventive services as well as general preventive health recommendations were provided to patient.   Nerissa Bannister, LPN   [id number]/[id number]   After Visit Summary: (MyChart) Due to this being a telephonic visit, the after visit summary with patients personalized plan was offered to patient via MyChart   Notes: Nothing significant to report at this time.

## 2024-05-03 NOTE — Patient Instructions (Addendum)
 Andrea Webster , Thank you for taking time out of your busy schedule to complete your Annual Wellness Visit with me. I enjoyed our conversation and look forward to speaking with you again next year. I, as well as your care team,  appreciate your ongoing commitment to your health goals. Please review the following plan we discussed and let me know if I can assist you in the future. Your Game plan/ To Do List    Referrals: If you haven't heard from the office you've been referred to, please reach out to them at the phone provided.    You have an order for:  []   2D Mammogram  [x]   3D Mammogram  [x]   Bone Density     Please call for appointment:  [facility]  [address] [place] [address] [phone]  [facility] [address] [place], [place] [address] [phone]  [facility] [address] Tracey Friday [place], [place] [address] [phone]  Make sure to wear two-piece clothing.  No lotions, powders, or deodorants the day of the appointment. Make sure to bring picture ID and insurance card.  Bring list of medications you are currently taking including any supplements.    You have been referred for colonoscopy at [place].  Follow up Visits: Next Medicare AWV with our clinical staff: 05/07/25 @ 8:502am televisit   Have you seen your provider in the last 6 months (3 months if uncontrolled diabetes)? Yes Next Office Visit with your provider: not scheduled due 5/26  Clinician Recommendations:  Aim for 30 minutes of exercise or brisk walking, 6-8 glasses of water, and 5 servings of fruits and vegetables each day.       This is a list of the screening recommended for you and due dates:  Health Maintenance  Topic Date Due   Zoster (Shingles) Vaccine (1 of 2) Never done   Pneumococcal Vaccine for age over 42 (2 of 2 - PCV) 08/25/2016   DEXA scan  (bone density measurement)  Never done   DTaP/Tdap/Td vaccine (2 - Td or Tdap) 04/12/2023   Colon Cancer Screening  06/27/2023   Flu Shot  06/21/2024   Medicare Annual Wellness Visit  05/03/2025   Mammogram  12/24/2025   Hepatitis C Screening  Completed   HPV Vaccine  Aged Out   Meningitis B Vaccine  Aged Out   COVID-19 Vaccine  Discontinued    Advanced directives: (Declined) Advance directive discussed with you today. Even though you declined this today, please call our office should you change your mind, and we can give you the proper paperwork for you to fill out. Advance Care Planning is important because it:  [x]  Makes sure you receive the medical care that is consistent with your values, goals, and preferences  [x]  It provides guidance to your family and loved ones and reduces their decisional burden about whether or not they are making the right decisions based on your wishes.  Follow the link provided in your after visit summary or read over the paperwork we have mailed to you to help you started getting your Advance Directives in place. If you need assistance in completing these, please reach out to us  so that we can help you!

## 2024-05-11 ENCOUNTER — Emergency Department
Admission: EM | Admit: 2024-05-11 | Discharge: 2024-05-11 | Disposition: A | Discharge status: Home / Self Care | Attending: Emergency Medicine | Admitting: Emergency Medicine

## 2024-05-11 ENCOUNTER — Encounter: Payer: Self-pay | Admitting: Intensive Care

## 2024-05-11 ENCOUNTER — Other Ambulatory Visit: Payer: Self-pay

## 2024-05-11 ENCOUNTER — Emergency Department

## 2024-05-11 DIAGNOSIS — K439 Ventral hernia without obstruction or gangrene: Secondary | ICD-10-CM | POA: Diagnosis present

## 2024-05-11 DIAGNOSIS — R1032 Left lower quadrant pain: Secondary | ICD-10-CM | POA: Insufficient documentation

## 2024-05-11 DIAGNOSIS — R1084 Generalized abdominal pain: Secondary | ICD-10-CM | POA: Diagnosis not present

## 2024-05-11 DIAGNOSIS — F17210 Nicotine dependence, cigarettes, uncomplicated: Secondary | ICD-10-CM | POA: Insufficient documentation

## 2024-05-11 DIAGNOSIS — I2510 Atherosclerotic heart disease of native coronary artery without angina pectoris: Secondary | ICD-10-CM | POA: Insufficient documentation

## 2024-05-11 DIAGNOSIS — I10 Essential (primary) hypertension: Secondary | ICD-10-CM | POA: Diagnosis not present

## 2024-05-11 DIAGNOSIS — Z9049 Acquired absence of other specified parts of digestive tract: Secondary | ICD-10-CM | POA: Diagnosis not present

## 2024-05-11 DIAGNOSIS — R109 Unspecified abdominal pain: Secondary | ICD-10-CM | POA: Diagnosis not present

## 2024-05-11 DIAGNOSIS — K429 Umbilical hernia without obstruction or gangrene: Secondary | ICD-10-CM | POA: Diagnosis not present

## 2024-05-11 LAB — URINALYSIS, ROUTINE W REFLEX MICROSCOPIC
Bilirubin Urine: NEGATIVE
Glucose, UA: NEGATIVE mg/dL
Hgb urine dipstick: NEGATIVE
Ketones, ur: NEGATIVE mg/dL
Nitrite: NEGATIVE
Protein, ur: NEGATIVE mg/dL
Specific Gravity, Urine: 1.003 — ABNORMAL LOW (ref 1.005–1.030)
pH: 6 (ref 5.0–8.0)

## 2024-05-11 LAB — CBC
HCT: 37 % (ref 36.0–46.0)
Hemoglobin: 12.7 g/dL (ref 12.0–15.0)
MCH: 29.1 pg (ref 26.0–34.0)
MCHC: 34.3 g/dL (ref 30.0–36.0)
MCV: 84.7 fL (ref 80.0–100.0)
Platelets: 250 10*3/uL (ref 150–400)
RBC: 4.37 MIL/uL (ref 3.87–5.11)
RDW: 14.1 % (ref 11.5–15.5)
WBC: 9.6 10*3/uL (ref 4.0–10.5)
nRBC: 0 % (ref 0.0–0.2)

## 2024-05-11 LAB — COMPREHENSIVE METABOLIC PANEL WITH GFR
ALT: 13 U/L (ref 0–44)
AST: 19 U/L (ref 15–41)
Albumin: 4.2 g/dL (ref 3.5–5.0)
Alkaline Phosphatase: 87 U/L (ref 38–126)
Anion gap: 11 (ref 5–15)
BUN: 6 mg/dL — ABNORMAL LOW (ref 8–23)
CO2: 22 mmol/L (ref 22–32)
Calcium: 9 mg/dL (ref 8.9–10.3)
Chloride: 100 mmol/L (ref 98–111)
Creatinine, Ser: 0.77 mg/dL (ref 0.44–1.00)
GFR, Estimated: >60 mL/min (ref >60)
Glucose, Bld: 96 mg/dL (ref 70–99)
Potassium: 3.6 mmol/L (ref 3.5–5.1)
Sodium: 133 mmol/L — ABNORMAL LOW (ref 135–145)
Total Bilirubin: 1.5 mg/dL — ABNORMAL HIGH (ref 0.0–1.2)
Total Protein: 7.2 g/dL (ref 6.5–8.1)

## 2024-05-11 LAB — LIPASE, BLOOD: Lipase: 27 U/L (ref 11–51)

## 2024-05-11 LAB — TROPONIN I (HIGH SENSITIVITY): Troponin I (High Sensitivity): 5 ng/L (ref <18)

## 2024-05-11 MED ORDER — MORPHINE SULFATE (PF) 2 MG/ML IV SOLN
2.0000 mg | Freq: Once | INTRAVENOUS | Status: AC
  Administered 2024-05-11: 2 mg via INTRAVENOUS
  Filled 2024-05-11: qty 1

## 2024-05-11 MED ORDER — IOHEXOL 300 MG/ML  SOLN
100.0000 mL | Freq: Once | INTRAMUSCULAR | Status: AC | PRN
  Administered 2024-05-11: 100 mL via INTRAVENOUS

## 2024-05-11 MED ORDER — SODIUM CHLORIDE 0.9 % IV BOLUS
500.0000 mL | Freq: Once | INTRAVENOUS | Status: AC
  Administered 2024-05-11: 500 mL via INTRAVENOUS

## 2024-05-11 NOTE — ED Notes (Signed)
 Surgery consult at bedside.

## 2024-05-11 NOTE — ED Provider Notes (Signed)
 [facility] Provider Note    Event Date/Time   First MD Initiated Contact with Patient 05/11/24 1718     (approximate)   History   Abdominal Pain   HPI  Andrea Webster is a 68 y.o. female past medical history significant for hyperlipidemia, CAD, who presents to the emergency department with abdominal pain.  Started having left lower abdominal pain over the past couple of days.  Constipated and last bowel movement was 4 days ago.  Took an over-the-counter medication and 2 suppositories without success.  Prior hernia surgery on the right side approximately 3 years ago.  Denies nausea or vomiting.  No diarrhea or blood in her stool.     Physical Exam   Triage Vital Signs: ED Triage Vitals  Encounter Vitals Group     BP 05/11/24 1712 (!) 145/61     Girls Systolic BP Percentile --      Girls Diastolic BP Percentile --      Boys Systolic BP Percentile --      Boys Diastolic BP Percentile --      Pulse Rate 05/11/24 1712 73     Resp 05/11/24 1712 16     Temp 05/11/24 1712 98.6 F (37 C)     Temp Source 05/11/24 1712 Oral     SpO2 05/11/24 1712 100 %     Weight 05/11/24 1709 175 lb (79.4 kg)     Height 05/11/24 1709 5' (1.524 m)     Head Circumference --      Peak Flow --      Pain Score 05/11/24 1709 3     Pain Loc --      Pain Education --      Exclude from Growth Chart --     Most recent vital signs: Vitals:   05/11/24 1930 05/11/24 2130  BP: 138/62 (!) 152/55  Pulse: 61 68  Resp: 18 16  Temp:    SpO2: 100% 100%    Physical Exam Constitutional:      Appearance: She is well-developed.  HENT:     Head: Atraumatic.   Eyes:     Conjunctiva/sclera: Conjunctivae normal.    Cardiovascular:     Rate and Rhythm: Regular rhythm.  Pulmonary:     Effort: No respiratory distress.  Abdominal:     General: There is no distension.     Tenderness: There is abdominal tenderness in the left lower quadrant.     Comments: Palpable mass in the  left lower abdomen   Musculoskeletal:        General: Normal range of motion.     Cervical back: Normal range of motion.   Skin:    General: Skin is warm.   Neurological:     Mental Status: She is alert. Mental status is at baseline.     IMPRESSION / MDM / ASSESSMENT AND PLAN / ED COURSE  I reviewed the triage vital signs and the nursing notes.  Differential diagnosis including constipation, small bowel obstruction, dehydration, intra-abdominal abscess, diverticulitis  Given morphine  for pain control and IV fluids  No tachycardic or bradycardic dysrhythmias while on cardiac telemetry.  RADIOLOGY I independently reviewed imaging, my interpretation of imaging: CT scan abdomen and pelvis with contrast  LABS (all labs ordered are listed, but only abnormal results are displayed) Labs interpreted as -    Labs Reviewed  COMPREHENSIVE METABOLIC PANEL WITH GFR - Abnormal; Notable for the following components:      Result  Value   Sodium 133 (*)    BUN 6 (*)    Total Bilirubin 1.5 (*)    All other components within normal limits  URINALYSIS, ROUTINE W REFLEX MICROSCOPIC - Abnormal; Notable for the following components:   Color, Urine YELLOW (*)    APPearance CLEAR (*)    Specific Gravity, Urine 1.003 (*)    Leukocytes,Ua SMALL (*)    Bacteria, UA MANY (*)    All other components within normal limits  URINE CULTURE  LIPASE, BLOOD  CBC  TROPONIN I (HIGH SENSITIVITY)     MDM   Clinical Course as of 05/11/24 2229  Sat May 11, 2024  2023 Discussed with surgery Dr. Marinda who will come in to the emergency department to evaluate the patient.  Patient NPO.  Unable to reduce her umbilical hernia. [SM]  2229 Dr. Marinda evaluated patient in the emergency department.  Able to reduce her hernia.  Tolerating p.o.  Will discharge the patient home with outpatient follow-up with general surgery.  Discussed bowel cleanout with MiraLAX.  Discussed return precautions for any return of  symptoms. [SM]    Clinical Course User Index [SM] Suzanne Kirsch, MD     PROCEDURES:  Critical Care performed: No  Procedures  Patient's presentation is most consistent with acute presentation with potential threat to life or bodily function.   MEDICATIONS ORDERED IN ED: Medications  sodium chloride  0.9 % bolus 500 mL (0 mLs Intravenous Stopped 05/11/24 1851)  morphine  (PF) 2 MG/ML injection 2 mg (2 mg Intravenous Given 05/11/24 1745)  iohexol  (OMNIPAQUE ) 300 MG/ML solution 100 mL (100 mLs Intravenous Contrast Given 05/11/24 1803)  morphine  (PF) 2 MG/ML injection 2 mg (2 mg Intravenous Given 05/11/24 2000)    FINAL CLINICAL IMPRESSION(S) / ED DIAGNOSES   Final diagnoses:  Generalized abdominal pain     Rx / DC Orders   ED Discharge Orders     None        Note:  This document was prepared using Dragon voice recognition software and may include unintentional dictation errors.   Suzanne Kirsch, MD 05/11/24 2229

## 2024-05-11 NOTE — Discharge Instructions (Signed)
 You are seen in the emergency department for a hernia.  It was able to be reduced in the emergency department.  Follow-up closely with general surgery with Dr. Marinda to discuss surgical options.  If your symptoms return it is importantly return to the emergency department.  For your constipation start a bowel cleanout.  Constipation management - take MiraLAX (4 capfulls) mixed with 32-64 ounces of fluid.  Drink this entire drink.  If you do not have a bowel movement that day you can repeat the next day and add 2 capfuls for a total of 6 capfuls of MiraLAX.  MiraLAX does not work if you do not drink plenty of fluids with it. Once you have a good bowel movement, take 1 capfull of Miralax daily until having regular bowel movements.

## 2024-05-11 NOTE — ED Notes (Signed)
 Pt was able to tolerate po challenge well.

## 2024-05-11 NOTE — Consult Note (Signed)
 Patient ID: Andrea Webster, female   DOB: Apr 12, 1956, 68 y.o.   MRN: [id number] CC: Ventral hernia. History of Present Illness Andrea Webster is a 68 y.o. female with past medical history significant for coronary artery disease, obesity who presents in consultation for ventral hernia.  The patient has a surgical history in her abdomen consistent with an appendectomy, laparoscopic cholecystectomy that was complicated by hernia formation.  She then underwent an intraperitoneal onlay mesh placement and had a recurrence of the hernia.  In 2021 she underwent a Jerrye Collette chancy.  She said that this lasted for about 4 months and then she was caring something heavy and she noticed that the hernia had recurred soon after surgery.  She says that since 2021 she has had a bulge but she has always been able to push it back in.  However over the last several days she feels constipated and has been unable to reduce the hernia.  She denies any nausea but does feel like she is little bit bloated.  She has had reduced p.o. intake but she has been able to eat some.  She has been passing flatus but has not had a bowel movement in 4 days.  Last week when she did have some constipation with the bulge she took an over-the-counter remedy and had a bowel movement.  She says that on exam it does not hurt unless you press on it.  She denies any fevers or chills.  She denies any overlying skin changes..  Patient reports that she intermittently smokes cigarettes and has been smoking the last several days because of the pain  Past Medical History Past Medical History:  Diagnosis Date   Anxiety    Arthritis    Baker's cyst    x 2   CAD (coronary artery disease)    a. s/p MI & PTCA - age 20;  b. 04/2015 PCI: LM nl, LAD 60p (FFR 0.83), D1 60ost, LCX 50p, 3m (2.75x15 Xience Alpine DES), OM1/2/3 min irregs, RCA 176m CTO; c. 08/2019 PCI: LM nl, LAD mod prox dzs, LCX patent stent w/ severe prox dzs (DES), RCA CTO mid.   Chronic  sciatica    Complication of anesthesia    Constipation    Depression    intolerant of paxil, wellbutrin, zoloft   Diastolic dysfunction    a. 08/2019 Echo: EF 60-65%, gr1 DD, PASP .   GERD (gastroesophageal reflux disease)    Hayfever    headaches    Heart disease    Heart murmur    Heart valve disease    High cholesterol    Hypertension    Incisional hernia    Insomnia    Myocardial infarction (HCC) 08/2019   stented. first MI at age 68   Plantar fasciitis    Primary insomnia    Rectal polyp 03/2020   RLS (restless legs syndrome)    Sciatica    Sleep apnea    snores very loudly   Smoker    a. quit 01/2014.       Past Surgical History:  Procedure Laterality Date   ANGIOPLASTY  1997   APPENDECTOMY  1978   open   CARDIAC CATHETERIZATION     [facility]   CARDIAC CATHETERIZATION N/A 04/30/2015   Procedure: Left Heart Cath;  Surgeon: Deatrice DELENA Cage, MD;  Location: [facility] INVASIVE CV [facility];  Service: Cardiovascular;  Laterality: N/A;   CARPAL TUNNEL RELEASE     CESAREAN SECTION  1980   CESAREAN SECTION  1984   CHOLECYSTECTOMY N/A 03/30/2016   Procedure: LAPAROSCOPIC CHOLECYSTECTOMY WITH INTRAOPERATIVE CHOLANGIOGRAM;  Surgeon: Laneta JULIANNA Luna, MD;  Location: [facility] ORS;  Service: General;  Laterality: N/A;   COLONOSCOPY WITH PROPOFOL  N/A 06/26/2020   Procedure: COLONOSCOPY WITH PROPOFOL ;  Surgeon: Dessa Reyes ORN, MD;  Location: [facility] ENDOSCOPY;  Service: Endoscopy;  Laterality: N/A;   CORONARY ANGIOPLASTY     [facility]    [facility]   CORONARY STENT INTERVENTION N/A 09/05/2019   Procedure: CORONARY STENT INTERVENTION;  Surgeon: Ammon Blunt, MD;  Location: [facility] CV [facility];  Service: Cardiovascular;  Laterality: N/A;   ESOPHAGOGASTRODUODENOSCOPY (EGD) WITH PROPOFOL  N/A 06/26/2020   Procedure: ESOPHAGOGASTRODUODENOSCOPY (EGD) WITH PROPOFOL ;  Surgeon: Dessa Reyes ORN, MD;  Location: [facility] ENDOS[facility];  Service: Endoscopy;  Laterality: N/A;   EXCISION OF MESH N/A 04/29/2020   Procedure: EXCISION OF MESH;  Surgeon: Dessa Reyes ORN, MD;  Location: [facility] ORS;  Service: General;  Laterality: N/A;   HERNIA REPAIR  11/2016   [facility]; Dr. Norleen Pin   LEFT HEART CATH AND CORONARY ANGIOGRAPHY N/A 09/05/2019   Procedure: LEFT HEART CATH AND CORONARY ANGIOGRAPHY;  Surgeon: Perla Evalene PARAS, MD;  Location: [facility];  Service: Cardiovascular;  Laterality: N/A;   VAGINAL HYSTERECTOMY     VENTRAL HERNIA REPAIR N/A 04/29/2020   Procedure: HERNIA REPAIR VENTRAL ADULT;  Surgeon: Dessa Reyes ORN, MD;  Location: [facility] ORS;  Service: General;  Laterality: N/A;  Ventral hernia repair with component separation- General anesthesia and exparel  block; Warren Raw, RNFA to assist    Allergies  Allergen Reactions   Atorvastatin      myalgia   Effexor  [Venlafaxine ]     Irritable   Paxil [Paroxetine]     Becomes tearful   Rosuvastatin      Myalgia    Sertraline     Irritable    Wellbutrin [Bupropion]     intolerant   Tape Rash    Paper tape tolerated    No current facility-administered medications for this encounter.   Current Outpatient Medications  Medication Sig Dispense Refill   albuterol  (VENTOLIN  HFA) 108 (90 Base) MCG/ACT inhaler INHALE 2 PUFFS EVERY 6 HOURS AS NEEDED FOR WHEEZING OR SHORTNESS OF BREATH 3 each 0   ALPRAZolam  (XANAX ) 1 MG tablet TAKE 1 TABLET 3 TIMES A DAYAS NEEDED FOR ANXIETY 90 tablet 2   aspirin  81 MG EC tablet Take 81 mg by mouth daily.      cetirizine (ZYRTEC) 10 MG chewable tablet Chew 10 mg by mouth daily.     cyclobenzaprine  (FLEXERIL ) 10 MG tablet TAKE 1 TABLET DAILY AS     NEEDED FOR MUSCLE SPASMS 90 tablet 1   Evolocumab  (REPATHA  SURECLICK) 140 MG/ML SOAJ Inject 140 mg into the skin every 14 (fourteen) days. 6 mL 3   fluticasone  (FLONASE ) 50 MCG/ACT nasal spray Place 1 spray into both nostrils in the morning and at  bedtime.     isosorbide  mononitrate (IMDUR ) 60 MG 24 hr tablet Take 1 tablet (60 mg total) by mouth daily. 90 tablet 3   ketotifen  (ZADITOR ) 0.025 % ophthalmic solution Place 1 drop into both eyes 2 (two) times daily.     metoprolol  tartrate (LOPRESSOR ) 25 MG tablet Take 1 tablet (25 mg total) by mouth 2 (two) times daily. 180 tablet 3   Multiple Vitamin (MULTIVITAMIN WITH MINERALS) TABS tablet Take 1  tablet by mouth daily.     nitroGLYCERIN  (NITROSTAT ) 0.4 MG SL tablet Place 0.4 mg under the tongue every 5 (five) minutes as needed for chest pain.     pantoprazole  (PROTONIX ) 40 MG tablet TAKE 1 TABLET EVERY DAY 90 tablet 3   senna (SENOKOT) 8.6 MG tablet Take 1 tablet by mouth at bedtime. Takes at night     zolpidem  (AMBIEN ) 10 MG tablet TAKE 1 TABLET AT BEDTIME ASNEEDED FOR SLEEP 90 tablet 1    Family History Family History  Problem Relation Age of Onset   Heart disease Mother    Diabetes Mother    Hypertension Mother    Heart attack Mother    Coronary artery disease Mother    Alcohol abuse Father    Heart disease Father    Heart attack Father    Coronary artery disease Father    Diabetes Brother    Hypertension Brother    Breast cancer Maternal Grandmother 30   Colon cancer Neg Hx        Social History Social History   Tobacco Use   Smoking status: Every Day    Current packs/day: 0.00    Average packs/day: 0.3 packs/day for 20.0 years (6.6 ttl pk-yrs)    Types: Cigarettes    Start date: 02/13/1995    Last attempt to quit: 02/13/2015    Years since quitting: 9.2   Smokeless tobacco: Never  Vaping Use   Vaping status: Never Used  Substance Use Topics   Alcohol use: Not Currently    Alcohol/week: 0.0 standard drinks of alcohol   Drug use: No        ROS Full ROS of systems performed and is otherwise negative there than what is stated in the HPI  Physical Exam Blood pressure 138/62, pulse 61, temperature 98.6 F (37 C), temperature source Oral, resp. rate 18,  height 5' (1.524 m), weight 79.4 kg, SpO2 100%. Pleasant and alert and oriented x 3, normal work of breathing on room air, regular rate and rhythm, abdomen is soft, obese, there is an area of induration in the left hemiabdomen just lateral to the umbilicus.  There is pain with deep palpation of this area.  This is consistent with her CT scan that shows a herniated loop of transverse colon.  There are no overlying skin changes.  She has multiple abdominal scars that are well-healed. Data Reviewed I have independently reviewed her labs that are consistent with a normal white count.  She has normal kidney function  I have independently reviewed her CT scan.  The small bowel does not look dilated.  She does not have a dilated stomach.  There is an area in the left hemiabdomen of herniated transverse colon.  It does not look like there is not acute obstruction within the hernia sac.  There is no fluid within the hernia sac. I have personally reviewed the patient's imaging and medical records.    Assessment    68 year old female with complex abdominal surgical history who has a ventral hernia.  She has had constipation over the last several days and has been unable to reduce her hernia.  Plan    With gentle consistent pressure I was able to fully reduce the hernia and the patient felt that it went back in.  She says that her pain subsided when was reduced.  The hernia defect seem to measure approximately 6 to 8 cm.  The patient has had multiple repairs of her hernia in the  past.  I discussed with her that this is a complex case and she may need a complete abdominal wall reconstruction.  For now however there is no acute indication for surgical intervention.  I recommended that she attempt to have p.o. intake tonight.  If she is able to tolerate p.o. intake she can be discharged from the emergency department.  I recommended that she take a laxative or anything that works to help her move her bowels.  Again,  she is passing flatus so I do not think that she has obstructed and her cecum does not look dilated.  I will discuss her case with my partners and we will see her in the office this week to discuss whether she needs referral to a complex abdominal wall reconstruction surgeon or if we have the capabilities to repair her hernia  A total of 55 minutes was spent reviewing the patient's chart, performing a history and physical, reducing the hernia and discussing treatment options with the patient.  Jayson MALVA Endow 05/11/2024, 9:37 PM

## 2024-05-11 NOTE — ED Notes (Signed)
Pt given beverage and crackers for PO challenge

## 2024-05-11 NOTE — ED Triage Notes (Signed)
 Patient c/o constipation X3-4 days. Reports she is having abdominal/hernia pain that started yesterday.   Denies N/V

## 2024-05-13 ENCOUNTER — Telehealth: Payer: Self-pay

## 2024-05-13 LAB — URINE CULTURE: Culture: >=100000 — AB

## 2024-05-13 NOTE — Telephone Encounter (Signed)
See ER note.  Thanks.

## 2024-05-13 NOTE — Telephone Encounter (Signed)
 SABRA

## 2024-05-13 NOTE — Telephone Encounter (Signed)
 Per chart review tab pt was seen [facility] ED. Sending note to Dr Cleatus.

## 2024-05-14 ENCOUNTER — Ambulatory Visit (INDEPENDENT_AMBULATORY_CARE_PROVIDER_SITE_OTHER): Admitting: Family Medicine

## 2024-05-14 ENCOUNTER — Encounter: Payer: Self-pay | Admitting: Family Medicine

## 2024-05-14 VITALS — BP 134/68 | HR 54 | Temp 98.8°F | Ht 61.0 in | Wt 178.4 lb

## 2024-05-14 DIAGNOSIS — N83209 Unspecified ovarian cyst, unspecified side: Secondary | ICD-10-CM | POA: Diagnosis not present

## 2024-05-14 DIAGNOSIS — K439 Ventral hernia without obstruction or gangrene: Secondary | ICD-10-CM | POA: Diagnosis not present

## 2024-05-14 DIAGNOSIS — N390 Urinary tract infection, site not specified: Secondary | ICD-10-CM | POA: Diagnosis not present

## 2024-05-14 MED ORDER — CEPHALEXIN 500 MG PO CAPS: 500.0000 mg | ORAL_CAPSULE | Freq: Three times a day (TID) | ORAL | 0 refills | Status: DC

## 2024-05-14 NOTE — Progress Notes (Unsigned)
 She had sig abd pain and went to ER.  Had surgery eval at ER and hernia was reduced.  She could feel it go back in and the pain resolved.  No FCNAVD.  She is moving her bowels better in the meantime.  She had been lifting a heavy crate of water in the meantime, prior to the pain.  Lifting cautions discussed with patient.  D/w pt about ucx.  She has some dysuria.  Rationale for antibiotic selection discussed.  Previous imaging with loop of transverse colon within a left periumbilical hernia. No obstructive changes are seen.   Changes of prior hernia repair are noted with small residual/recurrent areas of fatty herniation.   Right ovarian simple-appearing cyst measuring 4.1 cm. Recommend follow-up pelvic ultrasound in 6-12 months.   Message already placed in EMR for 6 month follow up.  Discussed.  Meds, vitals, and allergies reviewed.   ROS: Per HPI unless specifically indicated in ROS section   GEN: nad, alert and oriented HEENT: mucous membranes moist NECK: supple w/o LA CV: rrr.  no murmur PULM: ctab, no inc wob ABD: soft, +bs, Abd not ttp EXT: no edema SKIN: Well-perfused.

## 2024-05-14 NOTE — Progress Notes (Signed)
 ED Antimicrobial Stewardship Positive Culture Follow Up   Andrea Webster is an 68 y.o. female who presented to [facility] on 05/11/2024 with a chief complaint of  Chief Complaint  Patient presents with   Abdominal Pain    Recent Results (from the past 720 hours)  Urine Culture (for pregnant, neutropenic or urologic patients or patients with an indwelling urinary catheter)     Status: Abnormal   Collection Time: 05/11/24  5:19 PM   Specimen: Urine, Clean Catch  Result Value Ref Range Status   Specimen Description   Final    URINE, CLEAN CATCH Performed at [facility], [address], [place], [place] [address]    Special Requests   Final    NONE Performed at [facility], [address] [place], [place] [address]    Culture >=100,000 COLONIES/mL KLEBSIELLA PNEUMONIAE (A)  Final   Report Status 05/13/2024 FINAL  Final   Organism ID, Bacteria KLEBSIELLA PNEUMONIAE (A)  Final      Susceptibility   Klebsiella pneumoniae - MIC*    AMPICILLIN  >=32 RESISTANT Resistant     CEFAZOLIN  <=4 SENSITIVE Sensitive     CEFEPIME <=0.12 SENSITIVE Sensitive     CEFTRIAXONE <=0.25 SENSITIVE Sensitive     CIPROFLOXACIN <=0.25 SENSITIVE Sensitive     GENTAMICIN <=1 SENSITIVE Sensitive     IMIPENEM <=0.25 SENSITIVE Sensitive     NITROFURANTOIN 64 INTERMEDIATE Intermediate     TRIMETH /SULFA  <=20 SENSITIVE Sensitive     AMPICILLIN /SULBACTAM 8 SENSITIVE Sensitive     PIP/TAZO <=4 SENSITIVE Sensitive ug/mL    * >=100,000 COLONIES/mL KLEBSIELLA PNEUMONIAE    []  Treated with N/A, organism resistant to prescribed antimicrobial [x]  Patient discharged originally without antimicrobial agent and treatment is now indicated  New antibiotic prescription: Bactrim  1 DS BID x 3d  ED Provider: Dr. Jacolyn  Comments: Attempted to reach patient 05/14/24 via telephone. VM left. Plan is to discuss results with patient and assess for any symptoms of UTI. If patient denies symptoms and endorses  being back to normal standard of health will defer new antibiotic prescription. If any concern for infection, plan to call in antibiotic as above.   Andrea Webster 05/14/2024, 12:33 PM

## 2024-05-14 NOTE — Patient Instructions (Addendum)
 Start keflex.  Repeat ultrasound in 10/2024.  Try not to strain.  Avoid heavy lifting.  Check with the surgery clinic if they don't call you first.  Take care.  Glad to see you.

## 2024-05-15 ENCOUNTER — Encounter: Payer: Self-pay | Admitting: *Deleted

## 2024-05-15 DIAGNOSIS — N83209 Unspecified ovarian cyst, unspecified side: Secondary | ICD-10-CM | POA: Insufficient documentation

## 2024-05-15 DIAGNOSIS — N390 Urinary tract infection, site not specified: Secondary | ICD-10-CM | POA: Insufficient documentation

## 2024-05-15 NOTE — Assessment & Plan Note (Signed)
 Plan to repeat ultrasound in 10/2024.

## 2024-05-15 NOTE — Assessment & Plan Note (Signed)
 Start keflex.  Previous urine culture discussed with patient.

## 2024-05-15 NOTE — Assessment & Plan Note (Signed)
 Try not to strain.  Avoid heavy lifting.  I asked her to check with the surgery clinic if they don't call her first.

## 2024-05-15 NOTE — Consult Note (Addendum)
 Attempted to call patient for the second time. Left voicemail with call back number. See previous pharmacist note for treatment plan.   Addendum: Pt returned phone call. pt went to PCP and he prescribed abx for the patient.   Cathaleen Blanch, PharmD, BCPS

## 2024-06-06 DIAGNOSIS — I10 Essential (primary) hypertension: Secondary | ICD-10-CM | POA: Diagnosis not present

## 2024-06-06 DIAGNOSIS — G4700 Insomnia, unspecified: Secondary | ICD-10-CM | POA: Diagnosis not present

## 2024-06-06 DIAGNOSIS — I2510 Atherosclerotic heart disease of native coronary artery without angina pectoris: Secondary | ICD-10-CM | POA: Diagnosis not present

## 2024-06-06 DIAGNOSIS — Z6834 Body mass index (BMI) 34.0-34.9, adult: Secondary | ICD-10-CM | POA: Diagnosis not present

## 2024-06-06 DIAGNOSIS — E669 Obesity, unspecified: Secondary | ICD-10-CM | POA: Diagnosis not present

## 2024-06-06 DIAGNOSIS — E785 Hyperlipidemia, unspecified: Secondary | ICD-10-CM | POA: Diagnosis not present

## 2024-06-06 DIAGNOSIS — Z008 Encounter for other general examination: Secondary | ICD-10-CM | POA: Diagnosis not present

## 2024-06-06 DIAGNOSIS — I252 Old myocardial infarction: Secondary | ICD-10-CM | POA: Diagnosis not present

## 2024-06-06 DIAGNOSIS — K219 Gastro-esophageal reflux disease without esophagitis: Secondary | ICD-10-CM | POA: Diagnosis not present

## 2024-06-06 DIAGNOSIS — F17210 Nicotine dependence, cigarettes, uncomplicated: Secondary | ICD-10-CM | POA: Diagnosis not present

## 2024-06-27 ENCOUNTER — Other Ambulatory Visit: Payer: Self-pay | Admitting: Family Medicine

## 2024-06-27 MED ORDER — ALPRAZOLAM 1 MG PO TABS: 1.0000 mg | ORAL_TABLET | Freq: Three times a day (TID) | ORAL | 2 refills | Status: DC | PRN

## 2024-07-25 ENCOUNTER — Encounter: Payer: Self-pay | Admitting: General Surgery

## 2024-07-25 ENCOUNTER — Ambulatory Visit (INDEPENDENT_AMBULATORY_CARE_PROVIDER_SITE_OTHER): Admitting: General Surgery

## 2024-07-25 VITALS — BP 112/70 | HR 56 | Temp 98.3°F | Ht 59.5 in | Wt 159.4 lb

## 2024-07-25 DIAGNOSIS — K439 Ventral hernia without obstruction or gangrene: Secondary | ICD-10-CM

## 2024-07-25 NOTE — Patient Instructions (Signed)
 Belly Hernia (Ventral Hernia): What to Know  A ventral hernia is a bulge of tissue from inside the belly that pushes through a weak area of the belly. Sometimes, the bulge may have tissue from the small intestine or the large intestine. Ventral hernias do not go away without surgery. There are several types of ventral hernias. You may have: A hernia at a place where surgery was done (incisional hernia). A hernia just above the belly button (epigastric or paraumbilical hernia) or at the belly button (umbilical hernia). These can happen because of heavy lifting or straining. A hernia that comes and goes (reducible hernia). It may be visible only when you lift or strain. This type of hernia can be pushed back into the belly. A hernia that traps belly tissue inside the hernia (incarcerated hernia). This hernia cannot be pushed back into the belly. A hernia that cuts off blood flow to the tissues inside the hernia (strangulation hernia). The tissues can start to die if this happens. This is a very painful bulge that cannot be pushed back into the belly. This type of hernia is a medical emergency. What are the causes? This condition happens when tissue in the belly pushes on a weak area in the muscles. What increases the risk? You're more likely to have this condition if: You are 60 years or older. You had belly surgery in the past. This is common if there was an infection after surgery. You had an injury to the belly. You often lift or push heavy objects. You have been pregnant several times. You have long-term (chronic) health conditions that put pressure in your belly. These include: Being overweight or obese. Having a buildup of fluid inside your belly (ascites). You throw up or cough over and over again. You have trouble pooping (constipation). You strain to poop or pee. What are the signs or symptoms? The only symptom of a ventral hernia may be a painless bulge in the belly.  Reducible  hernia may be visible only when you strain, cough, or lift. Other symptoms may include: Dull pain. A feeling of pressure. Symptoms of an incarcerated hernia may include: Tenderness at hernia site. Bloating. Throwing up or feeling like you may throw up. Trouble pooping or no pooping at all. Symptoms of a strangulated hernia may include: More pain. Throwing up or feeling like you may throw up. Pain when pressing on the hernia. The skin over the hernia turning red or purple. Trouble pooping. Blood in the poop. How is this diagnosed? This condition may be diagnosed based on: Your symptoms and medical history. A physical exam. You may be asked to cough or strain while standing. These actions increase the pressure inside your belly and force the hernia through the opening in your belly. Your health care provider may try to reduce the hernia by gently pushing the hernia back in. Imaging studies, such as an ultrasound or CT scan. How is this treated? This condition is treated with surgery. If you have a strangulated hernia, surgery is done as soon as possible. If your hernia is small and not incarcerated, you may be asked to lose some weight before surgery. Follow these instructions at home: Eat and drink only as you've been told. Lose weight, if told by your provider. You may have to avoid lifting. Ask your provider how much you can safely lift. Avoid activities that increase pressure on your hernia. Take your medicines only as told. You may need to take steps to help treat  or prevent trouble pooping (constipation), such as: Taking medicines to help you poop. Eating foods high in fiber, like beans, whole grains, and fresh fruits and vegetables. Drinking more fluids as told. Ask your provider if it's safe to drive or use machines while taking your medicine. Contact a health care provider if: Your hernia gets larger or feels hard. Your hernia becomes painful. Get help right away if: Your  hernia becomes very painful. You have pain along with any of these: Changes in skin color in the area of the hernia. Feeling like throwing up. Throwing up. Fever. These symptoms may be an emergency. Call 911 right away. Do not wait to see if the symptoms will go away. Do not drive yourself to the hospital. This information is not intended to replace advice given to you by your health care provider. Make sure you discuss any questions you have with your health care provider. Document Revised: 05/16/2023 Document Reviewed: 05/16/2023 Elsevier Patient Education  2024 [employer].

## 2024-07-25 NOTE — Progress Notes (Signed)
 Outpatient Surgical Follow Up CC: Ventral Wall Hernia 07/25/2024  Andrea Webster is an 68 y.o. female.   Chief Complaint  Patient presents with   New Patient (Initial Visit)    Abdominal hernia   Ms. Andrea Webster returns today in follow-up for ventral hernia.  She was seen in the ED back in June for abdominal pain as well as nausea and vomiting.  At that time I was able to successfully reduce her hernia and she was discharged with follow-up.  She reports that since then she does have protrusion of the abdominal wall hernia.  She says that sometimes it will go hard but when she lays down it will going if she presses on it.  She denies persistent nausea or vomiting but says that she intermittently will have 1 or 2 episodes of emesis and vomiting.  She says that she has to keep her stools nice and soft to be able to use the bathroom.  Unfortunately she is still smoking.   Past Medical History:  Diagnosis Date   Anxiety    Arthritis    Baker's cyst    x 2   CAD (coronary artery disease)    a. s/p MI & PTCA - age 53;  b. 04/2015 PCI: LM nl, LAD 60p (FFR 0.83), D1 60ost, LCX 50p, 80m (2.75x15 Xience Alpine DES), OM1/2/3 min irregs, RCA 140m CTO; c. 08/2019 PCI: LM nl, LAD mod prox dzs, LCX patent stent w/ severe prox dzs (DES), RCA CTO mid.   Chronic sciatica    Complication of anesthesia    Constipation    Depression    intolerant of paxil, wellbutrin, zoloft   Diastolic dysfunction    a. 08/2019 Echo: EF 60-65%, gr1 DD, PASP .   GERD (gastroesophageal reflux disease)    Hayfever    headaches    Heart disease    Heart murmur    Heart valve disease    High cholesterol    Hypertension    Incisional hernia    Insomnia    Myocardial infarction (HCC) 08/2019   stented. first MI at age 45   Plantar fasciitis    Primary insomnia    Rectal polyp 03/2020   RLS (restless legs syndrome)    Sciatica    Sleep apnea    snores very loudly   Smoker    a. quit 01/2014.    Past Surgical  History:  Procedure Laterality Date   ANGIOPLASTY  1997   APPENDECTOMY  1978   open   CARDIAC CATHETERIZATION     [facility] [place]   CARDIAC CATHETERIZATION N/A 04/30/2015   Procedure: Left Heart Cath;  Surgeon: Deatrice DELENA Cage, MD;  Location: [facility] INVASIVE CV LAB;  Service: Cardiovascular;  Laterality: N/A;   CARPAL TUNNEL RELEASE     CESAREAN SECTION  1980   CESAREAN SECTION  1984   CHOLECYSTECTOMY N/A 03/30/2016   Procedure: LAPAROSCOPIC CHOLECYSTECTOMY WITH INTRAOPERATIVE CHOLANGIOGRAM;  Surgeon: Laneta JULIANNA Luna, MD;  Location: [facility] ORS;  Service: General;  Laterality: N/A;   COLONOSCOPY WITH PROPOFOL  N/A 06/26/2020   Procedure: COLONOSCOPY WITH PROPOFOL ;  Surgeon: Dessa Reyes ORN, MD;  Location: [facility] ENDOSCOPY;  Service: Endoscopy;  Laterality: N/A;   CORONARY ANGIOPLASTY     [facility]    [facility]ON[facility]   CORONARY STENT INTERVENTION N/A 09/05/2019   Procedure: CORONARY STENT INTERVENTION;  Surgeon: Ammon Blunt, MD;  Location: [facility]  INVASIVE CV LAB;  Service: Cardiovascular;  Laterality: N/A;   ESOPHAGOGASTRODUODENOSCOPY (EGD) WITH PROPOFOL  N/A 06/26/2020   Procedure: ESOPHAGOGASTRODUODENOSCOPY (EGD) WITH PROPOFOL ;  Surgeon: Dessa Reyes ORN, MD;  Location: [facility] ENDOSCOPY;  Service: Endoscopy;  Laterality: N/A;   EXCISION OF MESH N/A 04/29/2020   Procedure: EXCISION OF MESH;  Surgeon: Dessa Reyes ORN, MD;  Location: [facility] ORS;  Service: General;  Laterality: N/A;   HERNIA REPAIR  11/2016   [facility]; Dr. Norleen Pin   LEFT HEART CATH AND CORONARY ANGIOGRAPHY N/A 09/05/2019   Procedure: LEFT HEART CATH AND CORONARY ANGIOGRAPHY;  Surgeon: Perla Evalene PARAS, MD;  Location: [facility] INVASIVE CV LAB;  Service: Cardiovascular;  Laterality: N/A;   VAGINAL HYSTERECTOMY     VENTRAL HERNIA REPAIR N/A 04/29/2020   Procedure: HERNIA REPAIR VENTRAL ADULT;  Surgeon: Dessa Reyes ORN, MD;  Location: [facility]  ORS;  Service: General;  Laterality: N/A;  Ventral hernia repair with component separation- General anesthesia and exparel  block; Warren Raw, RNFA to assist    Family History  Problem Relation Age of Onset   Heart disease Mother    Diabetes Mother    Hypertension Mother    Heart attack Mother    Coronary artery disease Mother    Alcohol abuse Father    Heart disease Father    Heart attack Father    Coronary artery disease Father    Diabetes Brother    Hypertension Brother    Breast cancer Maternal Grandmother 18   Colon cancer Neg Hx     Social History:  reports that she has been smoking cigarettes. She started smoking about 29 years ago. She has a 6.6 pack-year smoking history. She has never used smokeless tobacco. She reports that she does not currently use alcohol. She reports that she does not use drugs.  Allergies:  Allergies  Allergen Reactions   Atorvastatin      myalgia   Effexor  [Venlafaxine ]     Irritable   Paxil [Paroxetine]     Becomes tearful   Rosuvastatin      Myalgia    Sertraline     Irritable    Wellbutrin [Bupropion]     intolerant   Tape Rash    Paper tape tolerated    Medications reviewed.    ROS Full ROS performed and is otherwise negative other than what is stated in HPI   BP 112/70   Pulse (!) 56   Temp 98.3 F (36.8 C) (Oral)   Ht 4' 11.5 (1.511 m)   Wt 159 lb 6.4 oz (72.3 kg)   SpO2 99%   BMI 31.66 kg/m   Physical Exam Alert and oriented x 3, normal work of breathing on room air, sinus bradycardia, abdomen is soft with redundant skin, abdominal incisions are well-healed in the left hemiabdomen just inferior to the umbilicus there is an area of hernia that is able to be reduced.  The hernia defect measures approximately 7-8 cm.    Independently reviewed her CT scan.  She does have multiple small hernias but the largest hernia that I can feel on exam does contain transverse colon Assessment/Plan: The patient is a 68 year old  female who had a laparoscopic cholecystectomy and then developed a hernia.  She then had an intraperitoneal onlay mesh repair followed by a Jerrye Arrow repair and now has recurrence of this.  I did discuss with her that she is not a candidate for elective repair at this time given that she is smoking.  I also talked  with her that I am concerned about doing this at a nonhernia center given her previous 2 hernia repairs with mesh that have recurred.  She is quite adamant about not going elsewhere for the surgery and would like [place].  I will talk with my partners about appropriate operative planning but again we would not proceed given that she is still smoking.  We will plan to see her again in 6 weeks and at that time I told her she needs to be nicotine free.  I also discussed hernia warning signs including signs of incarceration and strangulation that should prompt a call to our office or ED visit.  She understands these.  A total of 35 minutes was spent reviewing the patient's chart, performing history and physical and discussing treatment options with the patient.  Jayson Endow, M.D. [facility]

## 2024-07-29 ENCOUNTER — Other Ambulatory Visit: Payer: Self-pay | Admitting: Family Medicine

## 2024-07-29 MED ORDER — ALPRAZOLAM 1 MG PO TABS: 1.0000 mg | ORAL_TABLET | Freq: Three times a day (TID) | ORAL | 2 refills | Status: DC | PRN

## 2024-08-06 ENCOUNTER — Ambulatory Visit

## 2024-08-13 ENCOUNTER — Ambulatory Visit (INDEPENDENT_AMBULATORY_CARE_PROVIDER_SITE_OTHER)

## 2024-08-13 DIAGNOSIS — Z23 Encounter for immunization: Secondary | ICD-10-CM | POA: Diagnosis not present

## 2024-08-14 ENCOUNTER — Ambulatory Visit

## 2024-09-05 ENCOUNTER — Ambulatory Visit: Admitting: General Surgery

## 2024-09-19 ENCOUNTER — Other Ambulatory Visit: Payer: Self-pay

## 2024-09-19 MED ORDER — REPATHA SURECLICK 140 MG/ML ~~LOC~~ SOAJ: 140.0000 mg | SUBCUTANEOUS | 3 refills | Status: DC

## 2024-09-24 ENCOUNTER — Ambulatory Visit: Attending: Physician Assistant | Admitting: Physician Assistant

## 2024-09-24 ENCOUNTER — Encounter: Payer: Self-pay | Admitting: Physician Assistant

## 2024-09-24 VITALS — BP 120/60 | HR 66 | Ht 59.0 in | Wt 147.6 lb

## 2024-09-24 DIAGNOSIS — I10 Essential (primary) hypertension: Secondary | ICD-10-CM

## 2024-09-24 DIAGNOSIS — Z789 Other specified health status: Secondary | ICD-10-CM | POA: Diagnosis not present

## 2024-09-24 DIAGNOSIS — I358 Other nonrheumatic aortic valve disorders: Secondary | ICD-10-CM

## 2024-09-24 DIAGNOSIS — E785 Hyperlipidemia, unspecified: Secondary | ICD-10-CM | POA: Diagnosis not present

## 2024-09-24 DIAGNOSIS — I2510 Atherosclerotic heart disease of native coronary artery without angina pectoris: Secondary | ICD-10-CM

## 2024-09-24 DIAGNOSIS — Z8673 Personal history of transient ischemic attack (TIA), and cerebral infarction without residual deficits: Secondary | ICD-10-CM

## 2024-09-24 MED ORDER — REPATHA SURECLICK 140 MG/ML ~~LOC~~ SOAJ: 140.0000 mg | SUBCUTANEOUS | 3 refills | Status: DC

## 2024-09-24 NOTE — Patient Instructions (Signed)
 Medication Instructions:  Your physician recommends that you continue on your current medications as directed. Please refer to the Current Medication list given to you today.   *If you need a refill on your cardiac medications before your next appointment, please call your pharmacy*  Lab Work: None ordered at this time   Follow-Up: At [facility], you and your health needs are our priority.  As part of our continuing mission to provide you with exceptional heart care, our providers are all part of one team.  This team includes your primary Cardiologist (physician) and Advanced Practice Providers or APPs (Physician Assistants and Nurse Practitioners) who all work together to provide you with the care you need, when you need it.  Your next appointment:   6 month(s)  Provider:   You may see Deatrice Cage, MD or Bernardino Bring, PA-C  We recommend signing up for the patient portal called MyChart.  Sign up information is provided on this After Visit Summary.  MyChart is used to connect with patients for Virtual Visits (Telemedicine).  Patients are able to view lab/test results, encounter notes, upcoming appointments, etc.  Non-urgent messages can be sent to your provider as well.   To learn more about what you can do with MyChart, go to [email or web address].

## 2024-09-24 NOTE — Progress Notes (Signed)
 Cardiology Office Note    Date:  09/24/2024   ID:  Andrea Webster, DOB 1956/08/07, MRN [id number]  PCP:  Cleatus Arlyss RAMAN, MD  Cardiologist:  Deatrice Cage, MD  Electrophysiologist:  None   Chief Complaint: Follow-up  History of Present Illness:   Andrea Webster is a 68 y.o. female with history of CAD with MI at age 9 status post PTCA to unknown vessel status post DES to the LCx in 2016 and 2020, HTN, HLD intolerant to high intensity statins as well as abnormal LFTs noted on atorvastatin , strong family history of CAD, prior CVA noted on imaging, iron  deficiency anemia, prior tobacco use until 01/2015, obesity, and GERD who presents for follow-up of CAD.   LHC in 04/2015 showed CTO of the RCA, severe mid LCx disease with 90% stenosis, and moderate proximal LAD disease.  FFR within the LAD was nonsignificant at 0.83.  The LCx was felt to be the culprit vessel and was treated successfully with PCI/DES.  Lexiscan  MPI in 10/2016 showed a small mild region of ischemia noted in the mid to apical lateral wall, unable to exclude attenuation artifact, and was overall low risk given very small mild perfusion defect.  Echo in 11/2016 demonstrated an EF of 60 to 65%, no regional wall motion abnormalities, grade 1 diastolic dysfunction, calcified mitral annulus, mildly dilated left atrium, and trivial pericardial effusion.  She was admitted to the hospital in 08/2019 with unstable angina.  Echo showed an EF of 60 to 65% with grade 1 diastolic dysfunction, mild aortic valve sclerosis without evidence of stenosis, and moderate pulmonary hypertension with a PASP of 45 mmHg.  LHC showed known CTO of the RCA, moderate proximal LAD disease, patent LCx stent with severe proximal stenosis and moderate distal disease.  She underwent successful PCI/DES to the proximal LCx.     Right upper quadrant ultrasound in 03/2022, performed for elevated LFTs, demonstrated increased echogenicity of the liver parenchyma possibly  representing hepatic steatosis versus other hepatocellular disease.   She was admitted to [facility] in 11/2023 with cardiopulmonary arrest after choking on a sandwich at dinner with failed abdominal thrust by fianc with subsequent unresponsiveness.  Patient was given CPR by family until fire department arrived with subsequent ROSC.  EMS was able to clear her airway obstruction.  She did not require shocks or ACLS medications.  High-sensitivity troponin peaked at 111.  Chest x-ray showed no acute cardiopulmonary disease.  CT head showed a 2.1 cm hypodense nonhemorrhagic infarct band in the right frontal deep matter felt to be most likely a chronic infarct or a late subacute infarct in the late stages of evolution as well as atrophy and small vessel disease, carotid artery atherosclerosis, and remaining incidental findings noted in CT report.  MRI of the brain showed no acute intracranial abnormality with findings suspicious for a tiny subdural hematoma felt to be subacute to chronic in nature.  There was also a 5 mm extra-axial nodular density overlying the anterior left frontal convexity felt to possibly reflect a small meningioma without associated mass effect.  There was underlying age-related cerebral atrophy with mild chronic small vessel ischemic disease along with remote infarct involving the right basal ganglia/insula.  Echo showed an EF of 60 to 65%, no regional wall motion abnormalities, grade 1 diastolic dysfunction, normal RV systolic function and ventricular cavity size, normal RVSP, moderate to severe mitral annular calcification with no evidence of mitral regurgitation, and an estimated right atrial pressure of 3 mmHg.  Mildly elevated high-sensitivity troponin was suspected to be secondary to demand ischemia from choking episode and his CPR.  She followed up with cardiology in 12/2023 and was doing well, without symptoms of angina or cardiac decompensation.  She had significantly improved her diet,  and with this her weight was down 24 pounds.  Further testing was deferred at that time.  She was seen in the ER in 04/2024 with abdominal pain and associated constipation.  High-sensitivity troponin negative.  She comes in doing well from a cardiac perspective and is without symptoms of angina or cardiac decompensation.  With a heart healthy diet and regular exercise her weight is down another 66 pounds today when compared to her visit in 12/2023.  She indicates this is intentional.  She reports eating 2 boiled eggs for breakfast typically, a protein shake for lunch, and a sensible dinner.  Stays hydrated with water.  No lower extremity swelling or progressive orthopnea.  No dizziness, presyncope, or syncope.  No falls or symptoms concerning for bleeding.  Feels like she has more energy than she has in many years.  She does not have any acute cardiac concerns at this time.   Labs independently reviewed: 04/2024 - Hgb 12.7, PLT 250, potassium 3.6, BUN 6, serum creatinine 0.77, BUN 4.2, AST/ALT normal 03/2024 - TC 124, TG 92, HDL 35, LDL 70  Past Medical History:  Diagnosis Date   Anxiety    Arthritis    Baker's cyst    x 2   CAD (coronary artery disease)    a. s/p MI & PTCA - age 36;  b. 04/2015 PCI: LM nl, LAD 60p (FFR 0.83), D1 60ost, LCX 50p, 79m (2.75x15 Xience Alpine DES), OM1/2/3 min irregs, RCA 134m CTO; c. 08/2019 PCI: LM nl, LAD mod prox dzs, LCX patent stent w/ severe prox dzs (DES), RCA CTO mid.   Chronic sciatica    Complication of anesthesia    Constipation    Depression    intolerant of paxil, wellbutrin, zoloft   Diastolic dysfunction    a. 08/2019 Echo: EF 60-65%, gr1 DD, PASP .   GERD (gastroesophageal reflux disease)    Hayfever    headaches    Heart disease    Heart murmur    Heart valve disease    High cholesterol    Hypertension    Incisional hernia    Insomnia    Myocardial infarction (HCC) 08/2019   stented. first MI at age 50   Plantar fasciitis     Primary insomnia    Rectal polyp 03/2020   RLS (restless legs syndrome)    Sciatica    Sleep apnea    snores very loudly   Smoker    a. quit 01/2014.    Past Surgical History:  Procedure Laterality Date   ANGIOPLASTY  1997   APPENDECTOMY  1978   open   CARDIAC CATHETERIZATION     [facility]   CARDIAC CATHETERIZATION N/A 04/30/2015   Procedure: Left Heart Cath;  Surgeon: Deatrice DELENA Cage, MD;  Location: [facility];  Service: Cardiovascular;  Laterality: N/A;   CARPAL TUNNEL RELEASE     CESAREAN SECTION  1980   CESAREAN SECTION  1984   CHOLECYSTECTOMY N/A 03/30/2016   Procedure: LAPAROSCOPIC CHOLECYSTECTOMY WITH INTRAOPERATIVE CHOLANGIOGRAM;  Surgeon: Laneta JULIANNA Luna, MD;  Location: [facility] OR[facility];  Service: General;  Laterality: N/A;   COLONOSCOPY WITH PROPOFOL  N/A 06/26/2020   Procedure: COLONOSCOPY WITH PROPOFOL ;  Surgeon: Dessa,  Reyes ORN, MD;  Location: [facility] ENDOS[facility];  Service: Endoscopy;  Laterality: N/A;   CORONARY ANGIOPLASTY     [facility] [place]    CORONARY AN[facility]OPLASTY WITH STENT PLAC[facility]  [facility]   CORONARY STENT INTERVENTION N/A 09/05/2019   Procedure: CORONARY STENT INTERVENTION;  Surgeon: Ammon Blunt, MD;  Location: [facility];  Service: Cardiovascular;  Laterality: N/A;   ESOPHAGOGASTRODUODENOSCOPY (EGD) WITH PROPOFOL  N/A 06/26/2020   Procedure: ESOPHAGOGASTRODUODENOSCOPY (EGD) WITH PROPOFOL ;  Surgeon: Dessa Reyes ORN, MD;  Location: [facility];  Service: Endoscopy;  Laterality: N/A;   EXCISION OF MESH N/A 04/29/2020   Procedure: EXCISION OF MESH;  Surgeon: Dessa Reyes ORN, MD;  Location: [facility];  Service: General;  Laterality: N/A;   HERNIA REPAIR  11/2016   [facility]; Dr. Norleen Pin   LEFT HEART CATH AND CORONARY ANGIOGRAPHY N/A 09/05/2019   Procedure: LEFT HEART CATH AND CORONARY ANGIOGRAPHY;  Surgeon: Perla Evalene PARAS, MD;  Location: [facility];  Service: Cardiovascular;   Laterality: N/A;   VAGINAL HYSTERECTOMY     VENTRAL HERNIA REPAIR N/A 04/29/2020   Procedure: HERNIA REPAIR VENTRAL ADULT;  Surgeon: Dessa Reyes ORN, MD;  Location: [facility];  Service: General;  Laterality: N/A;  Ventral hernia repair with component separation- General anesthesia and exparel  block; Warren Raw, RNFA to assist    Current Medications: Current Meds  Medication Sig   ALPRAZolam  (XANAX ) 1 MG tablet Take 1 tablet (1 mg total) by mouth 3 (three) times daily as needed for anxiety.   aspirin  81 MG EC tablet Take 81 mg by mouth daily.    cetirizine (ZYRTEC) 10 MG chewable tablet Chew 10 mg by mouth daily.   cyclobenzaprine  (FLEXERIL ) 10 MG tablet TAKE 1 TABLET DAILY AS     NEEDED FOR MUSCLE SPASMS   fluticasone  (FLONASE ) 50 MCG/ACT nasal spray Place 1 spray into both nostrils in the morning and at bedtime.   isosorbide  mononitrate (IMDUR ) 60 MG 24 hr tablet Take 1 tablet (60 mg total) by mouth daily.   ketotifen  (ZADITOR ) 0.025 % ophthalmic solution Place 1 drop into both eyes 2 (two) times daily.   metoprolol  tartrate (LOPRESSOR ) 25 MG tablet Take 1 tablet (25 mg total) by mouth 2 (two) times daily.   Multiple Vitamin (MULTIVITAMIN WITH MINERALS) TABS tablet Take 1 tablet by mouth daily.   nitroGLYCERIN  (NITROSTAT ) 0.4 MG SL tablet Place 0.4 mg under the tongue every 5 (five) minutes as needed for chest pain.   pantoprazole  (PROTONIX ) 40 MG tablet TAKE 1 TABLET EVERY DAY   senna (SENOKOT) 8.6 MG tablet Take 1 tablet by mouth at bedtime. Takes at night   zolpidem  (AMBIEN ) 10 MG tablet TAKE 1 TABLET AT BEDTIME ASNEEDED FOR SLEEP   [DISCONTINUED] Evolocumab  (REPATHA  SURECLICK) 140 MG/ML SOAJ Inject 140 mg into the skin every 14 (fourteen) days.    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    Comment: disabled  Tobacco Use   Smoking status: Every Day    Current packs/day: 0.00    Average packs/day: 0.3 packs/day for 20.0 years (  6.6 ttl pk-yrs)    Types: Cigarettes    Start date: 02/13/1995    Last attempt to quit: 02/13/2015    Years since quitting: 9.6   Smokeless tobacco: Never  Vaping Use   Vaping status: Never Used  Substance and Sexual Activity   Alcohol use: Not Currently    Alcohol/week: 0.0 standard drinks of alcohol   Drug use: No   Sexual activity: Not on file  Other Topics Concern   Not on file  Social History Narrative   From [place], to [place] 2011   Widow as of 2001   1 son prev incarcerated, out as of 2019, in contact as of 2023   1 daughter   Lives with significant other, Tim   Social Drivers of Corporate Investment Banker Strain: Low Risk  (05/03/2024)   Overall Financial Resource Strain (CARDIA)    Difficulty of Paying Living Expenses: Not hard at all  Food Insecurity: No Food Insecurity (05/03/2024)   Hunger Vital Sign    Worried About Running Out of Food in the Last Year: Never true    Ran Out of Food in the Last Year: Never true  Transportation Needs: No Transportation Needs (05/03/2024)   PRAPARE - Administrator, Civil Service (Medical): No    Lack of Transportation (Non-Medical): No  Physical Activity: Sufficiently Active (05/03/2024)   Exercise Vital Sign    Days of Exercise per Week: 5 days    Minutes of Exercise per Session: 30 min  Stress: No Stress Concern Present (02/28/2023)   [employer] of Occupational Health - Occupational Stress Questionnaire    Feeling of Stress : Not at all  Social Connections: Unknown (05/03/2024)   Social Connection and Isolation Panel    Frequency of Communication with Friends and Family: More than three  times a week    Frequency of Social Gatherings with Friends and Family: More than three times a week    Attends Religious Services: More than 4 times per year    Active Member of [employer] or Organizations: No    Attends Engineer, Structural: Never    Marital Status: Not on file     Family History:  The patient's family history includes Alcohol abuse in her father; Breast cancer (age of onset: 71) in her maternal grandmother; Coronary artery disease in her father and mother; Diabetes in her brother and mother; Heart attack in her father and mother; Heart disease in her father and mother; Hypertension in her brother and mother. There is no history of Colon cancer.  ROS:   12-point review of systems is negative unless otherwise noted in the HPI.   EKGs/Labs/Other Studies Reviewed:    Studies reviewed were summarized above. The additional studies were reviewed today:  2D echo 11/29/2023: 1. Left ventricular ejection fraction, by estimation, is 60 to 65%. The  left ventricle has normal function. The left ventricle has no regional  wall motion abnormalities. Left ventricular diastolic parameters are  consistent with Grade I diastolic  dysfunction (impaired relaxation).   2. Right ventricular systolic function is normal. The right ventricular  size is normal. There is normal pulmonary artery systolic pressure.   3. No evidence of mitral valve regurgitation. Moderate to severe mitral  annular calcification.   4. The aortic valve was not well visualized. Aortic valve regurgitation  is not visualized.   5. The inferior vena cava is normal in size with greater than 50%  respiratory variability, suggesting right atrial  pressure of 3 mmHg.  __________   LHC 09/05/2019: Coronary dominance: Right/or codominant  Left mainstem:   Large vessel that bifurcates into the LAD and left circumflex, no significant disease noted  Left anterior descending (LAD):   Large vessel that extends to the  apical region, diagonal branch 2 of moderate size, proximal LAD with 50% stenosis, unchanged from 2016  Left circumflex (LCx):  Large vessel with OM branch 2, patent stent in the mid to distal vessel 90% proximal left circumflex disease prior to takeoff of the OM1, progression from 60% stenosis in 2016 Distal left circumflex disease estimated 50-60%, beyond the mid to distal stent  Right coronary artery (RCA): Moderate sized right coronary artery, occluded in the mid vessel, collaterals from left to right Known to be occluded from prior catheterization 2016  Left ventriculography: Left ventricular systolic function is normal, LVEF is estimated at 55%, there is no significant mitral regurgitation , no significant aortic valve stenosis  Final Conclusions:   Occluded mid RCA, known from 2016 Severe stenosis of proximal left circumflex Moderate proximal LAD disease Moderate distal left circumflex disease, distal to the stent  Recommendations:  Case discussed with interventional cardiology We will plan for PCI of proximal left circumflex, likely culprit vessel Medical management of proximal LAD disease, distal left circumflex disease Strong recommendation for smoking cessation     PCI: Prox Cx lesion is 90% stenosed. Previously placed Mid Cx to Dist Cx stent (unknown type) is widely patent. A drug-eluting stent was successfully placed using a STENT RESOLUTE ONYX 2.25X12. Post intervention, there is a 0% residual stenosis.   1.  Vessel CAD with 90% stenosis proximal left circumflex 2.  Successful PCI with DES proximal left circumflex __________   2D echo 09/05/2019: 1. Left ventricular ejection fraction, by visual estimation, is 60 to  65%. The left ventricle has normal function. Normal left ventricular size.  There is no left ventricular hypertrophy.   2. Left ventricular diastolic Doppler parameters are consistent with  impaired relaxation pattern of LV diastolic filling.   3.  Global right ventricle has normal systolic function.The right  ventricular size is normal. No increase in right ventricular wall  thickness.   4. Left atrial size was mildly dilated.   5. Moderately elevated pulmonary artery systolic pressure. 45 mm Hg   6. Definity  contrast agent was given IV to delineate the left ventricular  endocardial borders. ___________   2D echo 12/01/2016: - Left ventricle: The cavity size was normal. Wall thickness was    increased in a pattern of mild LVH. Systolic function was normal.    The estimated ejection fraction was in the range of 60% to 65%.    Wall motion was normal; there were no regional wall motion    abnormalities. Doppler parameters are consistent with abnormal    left ventricular relaxation (grade 1 diastolic dysfunction).  - Mitral valve: Calcified annulus.  - Left atrium: The atrium was mildly dilated.  - Pericardium, extracardiac: A trivial pericardial effusion was    identified. __________   Lexiscan  MPI 11/17/2016: Pharmacological myocardial perfusion imaging study with small mild region of ischemia noted in the mid to apical lateral wall Unable to definatively exclude attenuation artifact given high uptake in the inferoseptal wall.  Normal wall motion, EF estimated at 83% No EKG changes concerning for ischemia at peak stress or in recovery. Low risk scan given very small mild perfusion defect. __________   LHC 04/30/2015: Mid RCA lesion, 100% stenosed. Prox Cx lesion,  50% stenosed. Prox LAD lesion, 60% stenosed. Ost 1st Diag to 1st Diag lesion, 60% stenosed. Mid Cx to Dist Cx lesion, 90% stenosed. There is a 0% residual stenosis post intervention. A drug-eluting stent was placed.   1. Severe one-vessel coronary artery disease involving the mid left circumflex. Moderate proximal LAD stenosis not significant by FFR (0.83). Occluded small codominant mid RCA with left-to-right collaterals. 2. Normal LV systolic function by noninvasive  testing. Mildly elevated left ventricular end-diastolic pressure. 3. Successful angioplasty and drug-eluting stent placement to the mid left circumflex.   Recommendations: Dual antiplatelets therapy for at least one year. Aggressive treatment of risk factors. __________   [place] MPI 04/22/2015: This is an intermediate risk study. Defect 1: There is a medium defect of moderate severity present in the basal inferolateral, mid inferolateral, apical inferior and apical lateral location. Findings consistent with ischemia. There was no ST segment deviation noted during stress. T wave inversion of 3 mm was noted during stress in the lateral leads (V5, V6 and V2), beginning at 0 minutes of stress, ending at 1 minutes of stress, and returning to baseline after 1-5 mins of recovery. asymmetric The left ventricular ejection fraction is normal (55-65%).   If Clinical Conditions warrant, would recommend Cardiac Catheterization. __________   2D echo 04/23/2013: - Left ventricle: The cavity size was normal. Wall thickness    was increased in a pattern of mild LVH. The estimated    ejection fraction was 60%. Wall motion was normal; there    were no regional wall motion abnormalities. Findings    consistent with left ventricular diastolic dysfunction.  - Aortic valve: Sclerosis without stenosis. Mild    regurgitation.  - Mitral valve: Mildly to moderately calcified annulus.  - Tricuspid valve: Mild regurgitation.  - Pulmonary arteries: PA peak pressure: 32mm Hg (S).   EKG:  EKG is ordered today.  The EKG ordered today demonstrates NSR, 66 bpm, RBBB, consistent with prior tracing  Recent Labs: 05/11/2024: ALT 13; BUN 6; Creatinine, Ser 0.77; Hemoglobin 12.7; Platelets 250; Potassium 3.6; Sodium 133  Recent Lipid Panel    Component Value Date/Time   CHOL 124 03/26/2024 1007   CHOL 206 (H) 11/16/2016 1456   TRIG 92.0 03/26/2024 1007   HDL 35.50 (L) 03/26/2024 1007   HDL 54 11/16/2016 1456    CHOLHDL 3 03/26/2024 1007   VLDL 18.4 03/26/2024 1007   LDLCALC 70 03/26/2024 1007   LDLCALC 143 (H) 03/03/2023 1415    PHYSICAL EXAM:    VS:  BP 120/60 (BP Location: Left Arm, Patient Position: Sitting, Cuff Size: Normal)   Pulse 66 Comment: 70 oximeter  Ht 4' 11 (1.499 m)   Wt 147 lb 9.6 oz (67 kg) Comment: 143.lb at home  SpO2 97%   BMI 29.81 kg/m   BMI: Body mass index is 29.81 kg/m.  Physical Exam Vitals reviewed.  Constitutional:      Appearance: She is well-developed.  HENT:     Head: Normocephalic and atraumatic.  Eyes:     General:        Right eye: No discharge.        Left eye: No discharge.  Cardiovascular:     Rate and Rhythm: Normal rate and regular rhythm.     Heart sounds: S1 normal and S2 normal. Heart sounds not distant. No midsystolic click and no opening snap. Murmur heard.     Systolic murmur is present with a grade of 1/6 at the upper right sternal border.  No friction rub.  Pulmonary:     Effort: Pulmonary effort is normal. No respiratory distress.     Breath sounds: Normal breath sounds. No decreased breath sounds, wheezing, rhonchi or rales.  Musculoskeletal:     Cervical back: Normal range of motion.     Right lower leg: No edema.     Left lower leg: No edema.  Skin:    General: Skin is warm and dry.     Nails: There is no clubbing.  Neurological:     Mental Status: She is alert and oriented to person, place, and time.  Psychiatric:        Speech: Speech normal.        Behavior: Behavior normal.        Thought Content: Thought content normal.        Judgment: Judgment normal.     Wt Readings from Last 3 Encounters:  09/24/24 147 lb 9.6 oz (67 kg)  07/25/24 159 lb 6.4 oz (72.3 kg)  05/14/24 178 lb 6.4 oz (80.9 kg)     ASSESSMENT & PLAN:   CAD involving the native coronary arteries without angina: She continues to do well and is without symptoms concerning for angina or cardiac decompensation.  Continue aggressive risk factor  modification and secondary prevention including aspirin  81 mg, Imdur  60 mg, Repatha , and Lopressor  25 mg twice daily.  HTN: Blood pressure is well-controlled in the office today.  She remains on Imdur  60 mg and Lopressor  25 mg twice daily.  HLD with statin intolerance: LDL 70 in 03/2024.  Remains on Repatha  140 mg injected every 2 weeks.  History of remote CVA with abnormal MRI brain: No new deficits.  Remains on aspirin  and Repatha  as outlined above.  Follow-up with PCP.  Aortic valve sclerosis: No evidence of stenosis by echo in 11/2023.      Disposition: F/u with Dr. Darron or an APP in 6 months.   Medication Adjustments/Labs and Tests Ordered: Current medicines are reviewed at length with the patient today.  Concerns regarding medicines are outlined above. Medication changes, Labs and Tests ordered today are summarized above and listed in the Patient Instructions accessible in Encounters.   Signed, Bernardino Bring, PA-C 09/24/2024 3:21 PM      [facility] [address] [place], [place] [address] [phone]

## 2024-09-25 ENCOUNTER — Telehealth: Payer: Self-pay | Admitting: Pharmacy Technician

## 2024-09-25 NOTE — Telephone Encounter (Signed)
 Pharmacy Patient Advocate Encounter  Received notification from [facility] that Prior Authorization for repatha  has been APPROVED from 09/25/24 to 03/25/25   PA #/Case ID/Reference #: [id number]

## 2024-09-25 NOTE — Telephone Encounter (Signed)
    Pharmacy Patient Advocate Encounter   Received notification from Onbase that prior authorization for REPATHA  is required/requested.   Insurance verification completed.   The patient is insured through [employer].   Per test claim: PA required; PA submitted to above mentioned insurance via Latent Key/confirmation #/EOC [facility] Status is pending

## 2024-09-26 ENCOUNTER — Telehealth: Payer: Self-pay | Admitting: Cardiovascular Disease

## 2024-09-26 ENCOUNTER — Other Ambulatory Visit: Payer: Self-pay

## 2024-09-26 MED ORDER — REPATHA SURECLICK 140 MG/ML ~~LOC~~ SOAJ: 140.0000 mg | SUBCUTANEOUS | 3 refills | Status: AC

## 2024-09-26 NOTE — Telephone Encounter (Signed)
 Pt c/o medication issue:  1. Name of Medication:   Evolocumab  (REPATHA  SURECLICK) 140 MG/ML SOAJ    2. How are you currently taking this medication (dosage and times per day)? Inject 140 mg into the skin every 14 (fourteen) days.   3. Are you having a reaction (difficulty breathing--STAT)? No  4. What is your medication issue? Patient stated she received a text stating that her refill was denied due to not having the PA approved. Patient stated [facility] was the pharmacy that sent the text. Please advise.

## 2024-09-26 NOTE — Telephone Encounter (Signed)
 Called patient to advise PA approved until 03/25/25 for repatha , changed pharmacy to local [facility] per patient request

## 2024-10-01 ENCOUNTER — Telehealth: Payer: Self-pay | Admitting: Family Medicine

## 2024-10-01 NOTE — Telephone Encounter (Unsigned)
 Copied from CRM #[id number]. Topic: Appointments - Scheduling Inquiry for Clinic >> Oct 01, 2024 11:55 AM Andrea Webster wrote: Reason for CRM: Patient is wanting her shingles vaccination. Please call [phone] to schedule.

## 2024-10-02 NOTE — Telephone Encounter (Signed)
 Attempted to call patient.  No answer, left a message to call office.  We have [facility] coming 10/08/24 to give Medicare patients vaccines in the clinic. We can schedule her for one of our open slots.  Call will need to be directed to clinic as this is a spreadsheet and not a part of the clinic schedule.

## 2024-10-09 ENCOUNTER — Other Ambulatory Visit: Payer: Self-pay

## 2024-10-09 MED ORDER — SHINGRIX 50 MCG/0.5ML IM SUSR
INTRAMUSCULAR | 1 refills | Status: AC
  Filled 2024-10-09: qty 0.5, 1d supply, fill #0

## 2024-10-10 NOTE — Telephone Encounter (Signed)
 According to pt's chart, she received shingles shot 10/09/24.

## 2024-10-21 ENCOUNTER — Telehealth: Payer: Self-pay | Admitting: Family Medicine

## 2024-10-21 NOTE — Telephone Encounter (Unsigned)
 Copied from CRM #[id number]. Topic: Clinical - Medication Refill >> Oct 21, 2024 12:30 PM Alexandria E wrote: Medication: albuterol  (VENTOLIN  HFA) 108 (90 Base) MCG/ACT inhaler ALPRAZolam  (XANAX ) 1 MG tablet cyclobenzaprine  (FLEXERIL ) 10 MG tablet isosorbide  mononitrate (IMDUR ) 60 MG 24 hr tablet ketotifen  (ZADITOR ) 0.025 % ophthalmic solution metoprolol  tartrate (LOPRESSOR ) 25 MG tablet pantoprazole  (PROTONIX ) 40 MG tablet  zolpidem  (AMBIEN ) 10 MG tablet  Has the patient contacted their pharmacy? Yes (Agent: If no, request that the patient contact the pharmacy for the refill. If patient does not wish to contact the pharmacy document the reason why and proceed with request.) (Agent: If yes, when and what did the pharmacy advise?)  This is the patient's preferred pharmacy:    [facility] [phone] [facility] [place] - [address] [place] [address] Phone: [phone] Fax: [phone]  Is this the correct pharmacy for this prescription? Yes If no, delete pharmacy and type the correct one.   Has the prescription been filled recently? No  Is the patient out of the medication? Yes  Has the patient been seen for an appointment in the last year OR does the patient have an upcoming appointment? Yes  Can we respond through MyChart? Yes  Agent: Please be advised that Rx refills may take up to 3 business days. We ask that you follow-up with your pharmacy.

## 2024-10-23 MED ORDER — ALBUTEROL SULFATE HFA 108 (90 BASE) MCG/ACT IN AERS: 2.0000 | INHALATION_SPRAY | Freq: Four times a day (QID) | RESPIRATORY_TRACT | 0 refills | Status: AC | PRN

## 2024-10-23 MED ORDER — ZOLPIDEM TARTRATE 10 MG PO TABS: 10.0000 mg | ORAL_TABLET | Freq: Every evening | ORAL | 1 refills | Status: AC | PRN

## 2024-10-23 MED ORDER — ISOSORBIDE MONONITRATE ER 60 MG PO TB24: 60.0000 mg | ORAL_TABLET | Freq: Every day | ORAL | 3 refills | Status: AC

## 2024-10-23 MED ORDER — PANTOPRAZOLE SODIUM 40 MG PO TBEC: DELAYED_RELEASE_TABLET | ORAL | 3 refills | Status: AC

## 2024-10-23 MED ORDER — CYCLOBENZAPRINE HCL 10 MG PO TABS: 10.0000 mg | ORAL_TABLET | Freq: Every day | ORAL | 1 refills | Status: AC | PRN

## 2024-10-23 MED ORDER — METOPROLOL TARTRATE 25 MG PO TABS: 25.0000 mg | ORAL_TABLET | Freq: Two times a day (BID) | ORAL | 3 refills | Status: AC

## 2024-10-23 MED ORDER — ALPRAZOLAM 1 MG PO TABS: 1.0000 mg | ORAL_TABLET | Freq: Three times a day (TID) | ORAL | 2 refills | Status: AC | PRN

## 2024-10-23 NOTE — Telephone Encounter (Signed)
 Last OV: 05/14/24 Pending OV: Nothing scheduled Medication: Alprazolam  1mg  Directions: Take one tablet TID prn Last Refill: 07/29/24 Qty: #90 with 2 refills   Medication: Zolpidem  Directions: Take one at bedtime prn Last Refill: 04/23/24 Qty: #90 with 1 refill

## 2024-10-27 ENCOUNTER — Telehealth: Payer: Self-pay | Admitting: Family Medicine

## 2024-10-27 DIAGNOSIS — N83201 Unspecified ovarian cyst, right side: Secondary | ICD-10-CM

## 2024-10-27 NOTE — Telephone Encounter (Signed)
 Please call pt.  H/o right ovarian simple-appearing cyst measuring 4.1 cm.  Due for f/u u/s.  I put in the order and she should get a call about that.  Thanks.

## 2024-10-28 NOTE — Telephone Encounter (Signed)
 This has been scheduled for 11/12/24 at 1030.

## 2024-10-28 NOTE — Telephone Encounter (Signed)
 Once patient has been made aware of results and need for US  Pelvis, They can call [facility] directly to schedule.    [facility] [address] [place], [address] [place], [place] Phone: [phone]

## 2024-10-28 NOTE — Telephone Encounter (Signed)
 Noted. Thanks.

## 2024-11-12 ENCOUNTER — Other Ambulatory Visit

## 2024-11-28 ENCOUNTER — Telehealth: Payer: Self-pay

## 2024-11-28 NOTE — Telephone Encounter (Deleted)
 Copied from CRM #[id number]. Topic: Referral - Question >> Nov 28, 2024  9:32 AM Larissa RAMAN wrote: Reason for CRM: [place] with [facility] would like to know if U/S is with or without transvaginal.  Callback # [phone] option #1, then #5

## 2024-11-29 NOTE — Telephone Encounter (Signed)
 Spoke with Shawnee at [facility]. Informed her it is without transvaginal

## 2024-12-03 ENCOUNTER — Inpatient Hospital Stay: Admission: RE | Admit: 2024-12-03 | Source: Ambulatory Visit

## 2024-12-04 ENCOUNTER — Other Ambulatory Visit: Payer: Self-pay | Admitting: Family Medicine

## 2024-12-26 ENCOUNTER — Other Ambulatory Visit

## 2024-12-27 ENCOUNTER — Other Ambulatory Visit

## 2025-01-14 ENCOUNTER — Ambulatory Visit: Admit: 2025-01-14 | Admitting: Ophthalmology

## 2025-01-14 SURGERY — PHACOEMULSIFICATION, CATARACT, WITH IOL INSERTION
Anesthesia: Topical | Laterality: Right

## 2025-01-28 ENCOUNTER — Ambulatory Visit: Admit: 2025-01-28 | Admitting: Ophthalmology

## 2025-02-25 ENCOUNTER — Other Ambulatory Visit

## 2025-05-07 ENCOUNTER — Ambulatory Visit
